# Patient Record
Sex: Female | Born: 1950 | Race: Black or African American | Hispanic: No | Marital: Single | Smoking: Never smoker
Health system: Southern US, Community
[De-identification: ages and names within clinical notes are randomized; demographics above are authoritative.]

## PROBLEM LIST (undated history)

## (undated) DIAGNOSIS — T7840XA Allergy, unspecified, initial encounter: Secondary | ICD-10-CM

## (undated) DIAGNOSIS — I499 Cardiac arrhythmia, unspecified: Secondary | ICD-10-CM

## (undated) DIAGNOSIS — I10 Essential (primary) hypertension: Secondary | ICD-10-CM

## (undated) DIAGNOSIS — D259 Leiomyoma of uterus, unspecified: Secondary | ICD-10-CM

## (undated) DIAGNOSIS — K219 Gastro-esophageal reflux disease without esophagitis: Secondary | ICD-10-CM

## (undated) DIAGNOSIS — M1990 Unspecified osteoarthritis, unspecified site: Secondary | ICD-10-CM

## (undated) HISTORY — PX: COLONOSCOPY: SHX174

## (undated) HISTORY — DX: Leiomyoma of uterus, unspecified: D25.9

## (undated) HISTORY — DX: Gastro-esophageal reflux disease without esophagitis: K21.9

## (undated) HISTORY — DX: Essential (primary) hypertension: I10

## (undated) HISTORY — PX: TUBAL LIGATION: SHX77

## (undated) HISTORY — PX: HEMORRHOID SURGERY: SHX153

## (undated) HISTORY — PX: TONSILLECTOMY: SUR1361

---

## 1998-04-08 ENCOUNTER — Encounter: Admission: RE | Admit: 1998-04-08 | Discharge: 1998-04-08 | Payer: Self-pay | Admitting: Obstetrics

## 1999-04-19 ENCOUNTER — Emergency Department (HOSPITAL_COMMUNITY): Admission: EM | Admit: 1999-04-19 | Discharge: 1999-04-19 | Payer: Self-pay

## 1999-04-28 ENCOUNTER — Encounter: Admission: RE | Admit: 1999-04-28 | Discharge: 1999-04-28 | Payer: Self-pay | Admitting: Internal Medicine

## 1999-05-04 ENCOUNTER — Encounter: Admission: RE | Admit: 1999-05-04 | Discharge: 1999-05-04 | Payer: Self-pay | Admitting: Internal Medicine

## 1999-05-08 ENCOUNTER — Ambulatory Visit (HOSPITAL_COMMUNITY): Admission: RE | Admit: 1999-05-08 | Discharge: 1999-05-08 | Payer: Self-pay | Admitting: *Deleted

## 2000-03-15 ENCOUNTER — Encounter: Admission: RE | Admit: 2000-03-15 | Discharge: 2000-03-15 | Payer: Self-pay

## 2000-09-18 ENCOUNTER — Encounter: Admission: RE | Admit: 2000-09-18 | Discharge: 2000-09-18 | Payer: Self-pay | Admitting: Internal Medicine

## 2001-03-26 ENCOUNTER — Encounter: Admission: RE | Admit: 2001-03-26 | Discharge: 2001-03-26 | Payer: Self-pay

## 2001-04-30 ENCOUNTER — Encounter: Admission: RE | Admit: 2001-04-30 | Discharge: 2001-04-30 | Payer: Self-pay | Admitting: *Deleted

## 2001-06-19 ENCOUNTER — Emergency Department (HOSPITAL_COMMUNITY): Admission: EM | Admit: 2001-06-19 | Discharge: 2001-06-19 | Payer: Self-pay | Admitting: Emergency Medicine

## 2001-06-19 ENCOUNTER — Encounter: Payer: Self-pay | Admitting: Emergency Medicine

## 2002-02-20 ENCOUNTER — Encounter: Admission: RE | Admit: 2002-02-20 | Discharge: 2002-02-20 | Payer: Self-pay | Admitting: Internal Medicine

## 2002-03-20 ENCOUNTER — Encounter: Admission: RE | Admit: 2002-03-20 | Discharge: 2002-03-20 | Payer: Self-pay | Admitting: Internal Medicine

## 2002-03-26 ENCOUNTER — Ambulatory Visit (HOSPITAL_COMMUNITY): Admission: RE | Admit: 2002-03-26 | Discharge: 2002-03-26 | Payer: Self-pay | Admitting: Internal Medicine

## 2002-07-22 ENCOUNTER — Encounter: Admission: RE | Admit: 2002-07-22 | Discharge: 2002-07-22 | Payer: Self-pay | Admitting: Internal Medicine

## 2002-12-11 ENCOUNTER — Encounter: Admission: RE | Admit: 2002-12-11 | Discharge: 2002-12-11 | Payer: Self-pay | Admitting: Internal Medicine

## 2003-05-20 ENCOUNTER — Ambulatory Visit (HOSPITAL_COMMUNITY): Admission: RE | Admit: 2003-05-20 | Discharge: 2003-05-20 | Payer: Self-pay | Admitting: Internal Medicine

## 2003-06-26 LAB — FECAL OCCULT BLOOD, GUAIAC: Fecal Occult Blood: NEGATIVE

## 2003-07-17 ENCOUNTER — Encounter: Admission: RE | Admit: 2003-07-17 | Discharge: 2003-07-17 | Payer: Self-pay | Admitting: Internal Medicine

## 2003-08-18 ENCOUNTER — Ambulatory Visit (HOSPITAL_COMMUNITY): Admission: RE | Admit: 2003-08-18 | Discharge: 2003-08-18 | Payer: Self-pay | Admitting: Internal Medicine

## 2003-08-18 ENCOUNTER — Encounter (INDEPENDENT_AMBULATORY_CARE_PROVIDER_SITE_OTHER): Payer: Self-pay | Admitting: Cardiology

## 2003-11-13 ENCOUNTER — Ambulatory Visit: Payer: Self-pay | Admitting: Internal Medicine

## 2003-11-19 ENCOUNTER — Ambulatory Visit: Payer: Self-pay | Admitting: Internal Medicine

## 2003-11-21 ENCOUNTER — Encounter (INDEPENDENT_AMBULATORY_CARE_PROVIDER_SITE_OTHER): Payer: Self-pay | Admitting: Internal Medicine

## 2003-11-26 ENCOUNTER — Ambulatory Visit: Payer: Self-pay | Admitting: Internal Medicine

## 2004-03-07 ENCOUNTER — Emergency Department (HOSPITAL_COMMUNITY): Admission: EM | Admit: 2004-03-07 | Discharge: 2004-03-07 | Payer: Self-pay | Admitting: Family Medicine

## 2004-03-27 ENCOUNTER — Ambulatory Visit (HOSPITAL_COMMUNITY): Admission: RE | Admit: 2004-03-27 | Discharge: 2004-03-27 | Payer: Self-pay | Admitting: Orthopedic Surgery

## 2004-03-31 ENCOUNTER — Inpatient Hospital Stay (HOSPITAL_COMMUNITY): Admission: AD | Admit: 2004-03-31 | Discharge: 2004-03-31 | Payer: Self-pay

## 2004-04-05 ENCOUNTER — Encounter (INDEPENDENT_AMBULATORY_CARE_PROVIDER_SITE_OTHER): Payer: Self-pay | Admitting: Specialist

## 2004-04-05 ENCOUNTER — Ambulatory Visit: Payer: Self-pay | Admitting: Obstetrics and Gynecology

## 2004-06-29 ENCOUNTER — Ambulatory Visit: Payer: Self-pay | Admitting: Internal Medicine

## 2004-06-29 ENCOUNTER — Ambulatory Visit (HOSPITAL_COMMUNITY): Admission: RE | Admit: 2004-06-29 | Discharge: 2004-06-29 | Payer: Self-pay | Admitting: Family Medicine

## 2005-03-08 ENCOUNTER — Ambulatory Visit: Payer: Self-pay | Admitting: Internal Medicine

## 2005-06-08 ENCOUNTER — Encounter (INDEPENDENT_AMBULATORY_CARE_PROVIDER_SITE_OTHER): Payer: Self-pay | Admitting: Internal Medicine

## 2005-06-08 ENCOUNTER — Ambulatory Visit: Payer: Self-pay | Admitting: Internal Medicine

## 2005-06-08 ENCOUNTER — Encounter (INDEPENDENT_AMBULATORY_CARE_PROVIDER_SITE_OTHER): Payer: Self-pay | Admitting: *Deleted

## 2005-06-14 ENCOUNTER — Ambulatory Visit: Payer: Self-pay | Admitting: Internal Medicine

## 2005-07-19 ENCOUNTER — Ambulatory Visit (HOSPITAL_COMMUNITY): Admission: RE | Admit: 2005-07-19 | Discharge: 2005-07-19 | Payer: Self-pay | Admitting: Internal Medicine

## 2005-07-19 ENCOUNTER — Ambulatory Visit: Payer: Self-pay | Admitting: Obstetrics and Gynecology

## 2005-07-19 ENCOUNTER — Encounter (INDEPENDENT_AMBULATORY_CARE_PROVIDER_SITE_OTHER): Payer: Self-pay | Admitting: Internal Medicine

## 2005-09-21 ENCOUNTER — Ambulatory Visit: Payer: Self-pay | Admitting: Hospitalist

## 2005-12-09 DIAGNOSIS — N9489 Other specified conditions associated with female genital organs and menstrual cycle: Secondary | ICD-10-CM | POA: Insufficient documentation

## 2005-12-09 DIAGNOSIS — I10 Essential (primary) hypertension: Secondary | ICD-10-CM | POA: Insufficient documentation

## 2005-12-09 DIAGNOSIS — L259 Unspecified contact dermatitis, unspecified cause: Secondary | ICD-10-CM

## 2005-12-09 DIAGNOSIS — K219 Gastro-esophageal reflux disease without esophagitis: Secondary | ICD-10-CM

## 2005-12-09 DIAGNOSIS — D509 Iron deficiency anemia, unspecified: Secondary | ICD-10-CM

## 2006-02-14 ENCOUNTER — Telehealth: Payer: Self-pay | Admitting: Internal Medicine

## 2006-04-20 ENCOUNTER — Telehealth (INDEPENDENT_AMBULATORY_CARE_PROVIDER_SITE_OTHER): Payer: Self-pay | Admitting: Internal Medicine

## 2006-04-26 ENCOUNTER — Ambulatory Visit: Payer: Self-pay | Admitting: Internal Medicine

## 2006-04-26 ENCOUNTER — Encounter (INDEPENDENT_AMBULATORY_CARE_PROVIDER_SITE_OTHER): Payer: Self-pay | Admitting: Internal Medicine

## 2006-04-26 ENCOUNTER — Encounter (INDEPENDENT_AMBULATORY_CARE_PROVIDER_SITE_OTHER): Payer: Self-pay | Admitting: Infectious Diseases

## 2006-04-26 DIAGNOSIS — N898 Other specified noninflammatory disorders of vagina: Secondary | ICD-10-CM | POA: Insufficient documentation

## 2006-04-26 LAB — CONVERTED CEMR LAB
BUN: 11 mg/dL (ref 6–23)
CO2: 28 meq/L (ref 19–32)
Calcium: 9.4 mg/dL (ref 8.4–10.5)
Chlamydia, DNA Probe: NEGATIVE
Glucose, Bld: 85 mg/dL (ref 70–99)
Nitrite: NEGATIVE
Potassium: 3.6 meq/L (ref 3.5–5.3)
Protein, ur: NEGATIVE mg/dL
Sodium: 141 meq/L (ref 135–145)
Urine Glucose: NEGATIVE mg/dL
pH: 5.5 (ref 5.0–8.0)

## 2006-04-27 ENCOUNTER — Telehealth: Payer: Self-pay | Admitting: *Deleted

## 2006-04-27 LAB — CONVERTED CEMR LAB: Candida species: NEGATIVE

## 2006-08-06 ENCOUNTER — Telehealth (INDEPENDENT_AMBULATORY_CARE_PROVIDER_SITE_OTHER): Payer: Self-pay | Admitting: *Deleted

## 2006-09-06 ENCOUNTER — Ambulatory Visit (HOSPITAL_COMMUNITY): Admission: RE | Admit: 2006-09-06 | Discharge: 2006-09-06 | Payer: Self-pay | Admitting: Internal Medicine

## 2006-10-30 ENCOUNTER — Telehealth (INDEPENDENT_AMBULATORY_CARE_PROVIDER_SITE_OTHER): Payer: Self-pay | Admitting: *Deleted

## 2007-02-14 ENCOUNTER — Encounter (INDEPENDENT_AMBULATORY_CARE_PROVIDER_SITE_OTHER): Payer: Self-pay | Admitting: *Deleted

## 2007-02-14 ENCOUNTER — Ambulatory Visit: Payer: Self-pay | Admitting: Internal Medicine

## 2007-02-14 LAB — CONVERTED CEMR LAB
ALT: 11 units/L (ref 0–35)
CO2: 26 meq/L (ref 19–32)
Chloride: 102 meq/L (ref 96–112)
Creatinine, Ser: 0.95 mg/dL (ref 0.40–1.20)
MCHC: 32.4 g/dL (ref 30.0–36.0)
Potassium: 3.6 meq/L (ref 3.5–5.3)
RBC: 4.35 M/uL (ref 3.87–5.11)
RDW: 13.6 % (ref 11.5–15.5)
Total Bilirubin: 0.3 mg/dL (ref 0.3–1.2)
Total Protein: 7.1 g/dL (ref 6.0–8.3)
WBC: 4.1 10*3/uL (ref 4.0–10.5)

## 2007-02-15 DIAGNOSIS — M79609 Pain in unspecified limb: Secondary | ICD-10-CM

## 2007-02-27 ENCOUNTER — Ambulatory Visit: Payer: Self-pay | Admitting: Internal Medicine

## 2007-02-27 ENCOUNTER — Encounter (INDEPENDENT_AMBULATORY_CARE_PROVIDER_SITE_OTHER): Payer: Self-pay | Admitting: *Deleted

## 2007-02-27 LAB — CONVERTED CEMR LAB
HDL: 57 mg/dL (ref >39)
Total CHOL/HDL Ratio: 2.8
Triglycerides: 76 mg/dL (ref <150)
VLDL: 15 mg/dL (ref 0–40)

## 2007-07-18 ENCOUNTER — Ambulatory Visit: Payer: Self-pay | Admitting: Obstetrics and Gynecology

## 2007-07-18 ENCOUNTER — Encounter (INDEPENDENT_AMBULATORY_CARE_PROVIDER_SITE_OTHER): Payer: Self-pay | Admitting: Gynecology

## 2007-07-24 ENCOUNTER — Ambulatory Visit (HOSPITAL_COMMUNITY): Admission: RE | Admit: 2007-07-24 | Discharge: 2007-07-24 | Payer: Self-pay | Admitting: Family Medicine

## 2007-08-01 ENCOUNTER — Ambulatory Visit: Payer: Self-pay | Admitting: Family Medicine

## 2007-09-18 ENCOUNTER — Ambulatory Visit: Payer: Self-pay | Admitting: Obstetrics & Gynecology

## 2007-09-18 ENCOUNTER — Other Ambulatory Visit: Admission: RE | Admit: 2007-09-18 | Discharge: 2007-09-18 | Payer: Self-pay | Admitting: Gynecology

## 2007-09-26 ENCOUNTER — Ambulatory Visit (HOSPITAL_COMMUNITY): Admission: RE | Admit: 2007-09-26 | Discharge: 2007-09-26 | Payer: Self-pay | Admitting: Obstetrics and Gynecology

## 2007-10-02 ENCOUNTER — Ambulatory Visit: Payer: Self-pay | Admitting: Obstetrics and Gynecology

## 2008-02-05 ENCOUNTER — Ambulatory Visit: Payer: Self-pay | Admitting: Internal Medicine

## 2008-02-05 ENCOUNTER — Encounter (INDEPENDENT_AMBULATORY_CARE_PROVIDER_SITE_OTHER): Payer: Self-pay | Admitting: Internal Medicine

## 2008-02-05 DIAGNOSIS — G5710 Meralgia paresthetica, unspecified lower limb: Secondary | ICD-10-CM

## 2008-02-05 LAB — CONVERTED CEMR LAB
CO2: 28 meq/L (ref 19–32)
Chloride: 100 meq/L (ref 96–112)
Creatinine, Ser: 1.06 mg/dL (ref 0.40–1.20)
Glucose, Bld: 80 mg/dL (ref 70–99)
Potassium: 3.6 meq/L (ref 3.5–5.3)
Sodium: 140 meq/L (ref 135–145)

## 2008-08-12 ENCOUNTER — Encounter (INDEPENDENT_AMBULATORY_CARE_PROVIDER_SITE_OTHER): Payer: Self-pay | Admitting: Internal Medicine

## 2008-08-12 ENCOUNTER — Ambulatory Visit: Payer: Self-pay | Admitting: Internal Medicine

## 2008-08-12 DIAGNOSIS — K0501 Acute gingivitis, non-plaque induced: Secondary | ICD-10-CM

## 2008-09-23 ENCOUNTER — Telehealth (INDEPENDENT_AMBULATORY_CARE_PROVIDER_SITE_OTHER): Payer: Self-pay | Admitting: Internal Medicine

## 2008-10-10 ENCOUNTER — Emergency Department (HOSPITAL_COMMUNITY): Admission: EM | Admit: 2008-10-10 | Discharge: 2008-10-10 | Payer: Self-pay | Admitting: Emergency Medicine

## 2008-10-26 ENCOUNTER — Ambulatory Visit: Payer: Self-pay | Admitting: Infectious Diseases

## 2008-10-26 DIAGNOSIS — M549 Dorsalgia, unspecified: Secondary | ICD-10-CM | POA: Insufficient documentation

## 2008-10-26 LAB — CONVERTED CEMR LAB
CO2: 29 meq/L (ref 19–32)
Calcium: 9.7 mg/dL (ref 8.4–10.5)

## 2008-11-04 ENCOUNTER — Ambulatory Visit (HOSPITAL_COMMUNITY): Admission: RE | Admit: 2008-11-04 | Discharge: 2008-11-04 | Payer: Self-pay | Admitting: Internal Medicine

## 2009-02-11 ENCOUNTER — Ambulatory Visit: Payer: Self-pay | Admitting: Obstetrics and Gynecology

## 2009-02-25 ENCOUNTER — Telehealth (INDEPENDENT_AMBULATORY_CARE_PROVIDER_SITE_OTHER): Payer: Self-pay | Admitting: Internal Medicine

## 2009-04-30 ENCOUNTER — Telehealth (INDEPENDENT_AMBULATORY_CARE_PROVIDER_SITE_OTHER): Payer: Self-pay | Admitting: Internal Medicine

## 2009-06-11 ENCOUNTER — Ambulatory Visit: Payer: Self-pay | Admitting: Internal Medicine

## 2009-07-09 ENCOUNTER — Telehealth: Payer: Self-pay | Admitting: *Deleted

## 2009-07-13 ENCOUNTER — Emergency Department (HOSPITAL_COMMUNITY)
Admission: EM | Admit: 2009-07-13 | Discharge: 2009-07-13 | Payer: Self-pay | Source: Home / Self Care | Admitting: Emergency Medicine

## 2009-09-27 ENCOUNTER — Ambulatory Visit: Payer: Self-pay | Admitting: Internal Medicine

## 2009-09-28 ENCOUNTER — Encounter: Payer: Self-pay | Admitting: Internal Medicine

## 2009-09-28 LAB — CONVERTED CEMR LAB
Albumin: 3.8 g/dL (ref 3.5–5.2)
Alkaline Phosphatase: 67 units/L (ref 39–117)
Calcium: 9.3 mg/dL (ref 8.4–10.5)
Chloride: 104 meq/L (ref 96–112)
Cholesterol: 141 mg/dL (ref 0–200)
Creatinine, Ser: 0.98 mg/dL (ref 0.40–1.20)
Glucose, Bld: 87 mg/dL (ref 70–99)
HDL: 56 mg/dL (ref >39)
LDL Cholesterol: 72 mg/dL (ref 0–99)
Potassium: 3.8 meq/L (ref 3.5–5.3)
Sodium: 140 meq/L (ref 135–145)
TSH: 1.106 microintl units/mL (ref 0.350–4.5)
Total Bilirubin: 0.4 mg/dL (ref 0.3–1.2)
Total Protein: 6.5 g/dL (ref 6.0–8.3)
Triglycerides: 66 mg/dL (ref <150)
VLDL: 13 mg/dL (ref 0–40)

## 2009-12-13 ENCOUNTER — Telehealth: Payer: Self-pay | Admitting: Internal Medicine

## 2009-12-30 ENCOUNTER — Ambulatory Visit: Payer: Self-pay | Admitting: Internal Medicine

## 2009-12-30 DIAGNOSIS — M5430 Sciatica, unspecified side: Secondary | ICD-10-CM | POA: Insufficient documentation

## 2010-02-14 ENCOUNTER — Encounter: Payer: Self-pay | Admitting: Obstetrics & Gynecology

## 2010-02-14 ENCOUNTER — Ambulatory Visit
Admission: RE | Admit: 2010-02-14 | Discharge: 2010-02-14 | Payer: Self-pay | Source: Home / Self Care | Attending: Obstetrics and Gynecology | Admitting: Obstetrics and Gynecology

## 2010-02-14 LAB — CONVERTED CEMR LAB
Chlamydia, DNA Probe: NEGATIVE
GC Probe Amp, Genital: NEGATIVE

## 2010-02-15 ENCOUNTER — Encounter: Payer: Self-pay | Admitting: Obstetrics & Gynecology

## 2010-02-15 LAB — CONVERTED CEMR LAB

## 2010-02-17 ENCOUNTER — Other Ambulatory Visit (HOSPITAL_COMMUNITY): Payer: Self-pay | Admitting: *Deleted

## 2010-02-17 DIAGNOSIS — D219 Benign neoplasm of connective and other soft tissue, unspecified: Secondary | ICD-10-CM

## 2010-02-19 ENCOUNTER — Encounter: Payer: Self-pay | Admitting: Internal Medicine

## 2010-02-21 ENCOUNTER — Encounter: Payer: Self-pay | Admitting: *Deleted

## 2010-02-21 ENCOUNTER — Encounter: Payer: Self-pay | Admitting: Internal Medicine

## 2010-02-22 ENCOUNTER — Ambulatory Visit (HOSPITAL_COMMUNITY)
Admission: RE | Admit: 2010-02-22 | Discharge: 2010-02-22 | Payer: Self-pay | Source: Home / Self Care | Attending: Gastroenterology | Admitting: Gastroenterology

## 2010-03-01 NOTE — Assessment & Plan Note (Signed)
Summary: checkup, med refills, labs/pcp-Briana Ortiz/hla   Vital Signs:  Patient profile:   59 year old female Height:      61 inches (154.94 cm) Weight:      196.8 pounds (89.45 kg) BMI:     37.32 Temp:     96.8 degrees F (36 degrees C) Pulse rate:   62 / minute BP sitting:   135 / 96  (right arm) Cuff size:   large  Vitals Entered By: Dorie Rank RN (December 30, 2009 2:51 PM) CC: need to meet dr and wants refills - wants pain relief for "sciatica they say I have....across left hip and down my leg...standing on cement all day" Is Patient Diabetic? No Pain Assessment Patient in pain? yes     Location: left hip and leg Intensity: 6 Type: aching Onset of pain  worse when standing up - chronic Nutritional Status BMI of > 30 = obese  Have you ever been in a relationship where you felt threatened, hurt or afraid?No   Does patient need assistance? Functional Status Self care Ambulation Normal   Primary Care Provider:  Jason Coop MD  CC:  need to meet dr and wants refills - wants pain relief for "sciatica they say I have....across left hip and down my leg...standing on cement all day".  History of Present Illness: 60 yo woman with past medical history of chronic left lower back pain that radiates down to her leg.  She has been taking neurontin in the past several years but feel like the pain is getting worse and that Neutontin is not working.  Otherwise, no other complaints.  She is exercising and eating a lot of vegetables.       Preventive Screening-Counseling & Management  Alcohol-Tobacco     Alcohol type: beer on w/e     Smoking Status: never  Caffeine-Diet-Exercise     Does Patient Exercise: no  Allergies: No Known Drug Allergies  Review of Systems  The patient denies anorexia, fever, weight loss, weight gain, vision loss, decreased hearing, hoarseness, chest pain, syncope, dyspnea on exertion, peripheral edema, prolonged cough, headaches, hemoptysis, abdominal  pain, melena, hematochezia, severe indigestion/heartburn, hematuria, incontinence, genital sores, muscle weakness, suspicious skin lesions, transient blindness, difficulty walking, depression, unusual weight change, abnormal bleeding, enlarged lymph nodes, angioedema, breast masses, and testicular masses.    Physical Exam  General:  alert, well-developed, well-nourished, and well-hydrated.   Neck:  supple, no thyromegaly Lungs:  normal respiratory effort, no intercostal retractions, no accessory muscle use, and normal breath sounds.   Heart:  normal rate, regular rhythm, no murmur, and no gallop.   Abdomen:  soft, non-tender, normal bowel sounds, no distention, and no masses.   Msk:  limited flexion of left lower extremity secondary to sciatica pain,pain radiates from lower back to lower leg.  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  Patient reports that Neurontin 300mg  by mouth at bedtime is not working as before.  We will increase Neurontin to 600mg  by mouth at bedtime to see if she will get more relief.  She will come back in 4 weeks to follow up with me.    Problem # 2:  HYPERTENSION (ICD-401.9) Assessment: Unchanged At goal.  Will continue current medications.  Her updated medication list for this  problem includes:    Hydrochlorothiazide 25 Mg Tabs (Hydrochlorothiazide) .Marland Kitchen... Take 1 tablet by mouth once a day for blood pressure.    Benazepril Hcl 10 Mg Tabs (Benazepril hcl) .Marland Kitchen... Take 1 tablet by mouth once a day for blood pressure.  Problem # 3:  PREVENTIVE HEALTH CARE (ICD-V70.0) Flu shot given She had a mammogram appointment last week; however, could not make it and will reschedule Will schedule colonoscopy for screening.  Complete  Medication List: 1)  Hydrochlorothiazide 25 Mg Tabs (Hydrochlorothiazide) .... Take 1 tablet by mouth once a day for blood pressure. 2)  Benazepril Hcl 10 Mg Tabs (Benazepril hcl) .... Take 1 tablet by mouth once a day for blood pressure. 3)  Prilosec 20 Mg Cpdr (Omeprazole) .... Take 1pill by mouth daily as needed for acid reflux 4)  Neurontin 600 Mg Tabs (Gabapentin) .... Take one tablet at bed time 5)  Chlorhexidine Gluconate 0.12 % Soln (Chlorhexidine gluconate) .... Swish for 30 seconds with 15 ml chlorhexidine, then expectorate; repeat twice daily (morning and evening).  Other Orders: Influenza Vaccine NON MCR ([address]109) Radiology other (Radiology Other) Gastroenterology Referral (GI)  Patient Instructions: 1)  Please increase your Neurontin to 600mg  by mouth at bedtime and we can reevaluate your pain in 4 weeks. 2)  Continue diet and exercise- you are doing a great job! 3)  Need to schedule for Colonoscopy 4)  Need to schedule for Mammogram Prescriptions: NEURONTIN 600 MG TABS (GABAPENTIN) take one tablet at bed time  #30 x 6   Entered and Authorized by:   Rosana Berger MD   Signed by:   Rosana Berger MD on 12/30/2009   Method used:   Print then Give to Patient   RxID:   [id number]    Orders Added: 1)  Influenza Vaccine NON MCR [00028] 2)  Radiology other [Radiology Other] 3)  Gastroenterology Referral [GI] 4)  Est. Patient Level III [95621]   Immunizations Administered:  Influenza Vaccine # 1:    Vaccine Type: Fluvax Non-MCR    Site: right deltoid    Mfr: GlaxoSmithKline    Dose: 0.5 ml    Route: IM    Given by: Dorie Rank RN    Exp. Date: 07/30/2010    Lot #: [id number]    VIS given: 08/24/09 version given December 30, 2009.  Flu Vaccine Consent Questions:    Do you have a history of severe allergic reactions to this vaccine? no    Any prior history of allergic reactions to egg and/or gelatin? no    Do you have a sensitivity to the preservative Thimersol?  no    Do you have a past history of Guillan-Barre Syndrome? no    Do you currently have an acute febrile illness? no    Have you ever had a severe reaction to latex? no    Vaccine information given and explained to patient? yes    Are you currently pregnant? no   Immunizations Administered:  Influenza Vaccine # 1:    Vaccine Type: Fluvax Non-MCR    Site: right deltoid    Mfr: GlaxoSmithKline    Dose: 0.5 ml    Route: IM    Given by: Dorie Rank RN    Exp. Date: 07/30/2010    Lot #: [id number]    VIS given: 08/24/09 version given December 30, 2009.   Prevention & Chronic Care Immunizations   Influenza vaccine: Fluvax Non-MCR  (12/30/2009)    Tetanus booster: 07/22/2002: Td  Pneumococcal vaccine: Not documented  Colorectal Screening   Hemoccult: Negative  (06/26/2003)   Hemoccult action/deferral: Ordered  (06/11/2009)    Colonoscopy: Not documented   Colonoscopy action/deferral: GI referral  (06/11/2009)  Other Screening   Pap smear: Not documented    Mammogram: ASSESSMENT: Negative - BI-RADS 1^MM DIGITAL SCREENING  (11/04/2008)   Mammogram action/deferral: Ordered  (08/12/2008)   Smoking status: never  (12/30/2009)  Lipids   Total Cholesterol: 141  (09/27/2009)   LDL: 72  (09/27/2009)   LDL Direct: Not documented   HDL: 56  (09/27/2009)   Triglycerides: 66  (09/27/2009)  Hypertension   Last Blood Pressure: 135 / 96  (12/30/2009)   Serum creatinine: 0.98  (09/27/2009)   BMP action: Ordered   Serum potassium 3.8  (09/27/2009)  Self-Management Support :   Personal Goals (by the next clinic visit) :      Personal blood pressure goal: 140/90  (10/26/2008)   Patient will work on the following items until the next clinic visit to reach self-care goals:     Medications and monitoring: take my medicines every day, bring all of my medications to every visit  (12/30/2009)     Eating: drink diet soda or water instead of juice or soda, eat foods that are low in  salt, limit or avoid alcohol  (12/30/2009)     Activity: join a walking program  (12/30/2009)    Hypertension self-management support: Pre-printed educational material, Written self-care plan, Resources for patients handout  (12/30/2009)   Hypertension self-care plan printed.      Resource handout printed.   Nursing Instructions: Give Flu vaccine today     Appended Document: checkup, med refills, labs/pcp-Briana Ortiz/hla I discussed the patient with Dr. Anselm Jungling and I agree with the assessment and plan as outlined above.

## 2010-03-01 NOTE — Progress Notes (Signed)
Summary: refill/ hla  Phone Note Refill Request Message from:  Patient on December 13, 2009 8:52 AM  Refills Requested: Medication #1:  HYDROCHLOROTHIAZIDE 25 MG TABS Take 1 tablet by mouth once a day for blood pressure.   Dosage confirmed as above?Dosage Confirmed  Medication #2:  BENAZEPRIL HCL 10 MG TABS Take 1 tablet by mouth once a day for blood pressure.   Dosage confirmed as above?Dosage Confirmed  Medication #3:  PRILOSEC 20 MG CPDR take 1pill by mouth daily as needed for acid reflux   Dosage confirmed as above?Dosage Confirmed  Medication #4:  NEURONTIN 300 MG CAPS take 1 capsule at bedtime.   Dosage confirmed as above?Dosage Confirmed last visit 05/2009, overdue for appt, appt set for 12/1 w/ dr Anselm Jungling  Initial call taken by: Marin Roberts RN,  December 13, 2009 8:52 AM    Prescriptions: NEURONTIN 300 MG CAPS (GABAPENTIN) take 1 capsule at bedtime.  #30 x 3   Entered and Authorized by:   Ulyess Mort MD   Signed by:   Ulyess Mort MD on 12/13/2009   Method used:   Electronically to        [facility] (retail)       [address]       [place], [place]  [address]       Ph: [phone]       Fax: [phone]   RxID:   [id number] PRILOSEC 20 MG CPDR (OMEPRAZOLE) take 1pill by mouth daily as needed for acid reflux  #30 x 3   Entered and Authorized by:   Ulyess Mort MD   Signed by:   Ulyess Mort MD on 12/13/2009   Method used:   Electronically to        [facility] (retail)       [address]       [place], [place]  [address]       Ph: [phone]       Fax: [phone]   RxID:   [id number] BENAZEPRIL HCL 10 MG TABS (BENAZEPRIL HCL) Take 1 tablet by mouth once a day for blood pressure.  #30 x 3   Entered and Authorized by:   Ulyess Mort MD   Signed by:   Ulyess Mort MD on 12/13/2009   Method used:   Electronically to        [facility] (retail)       [address]       [place], [place]  [address]       Ph: [phone]       Fax: [phone]   RxID:   [id number] HYDROCHLOROTHIAZIDE 25 MG TABS (HYDROCHLOROTHIAZIDE) Take 1 tablet by mouth once a day for blood pressure.  #30 x 3   Entered and Authorized by:   Ulyess Mort MD   Signed by:   Ulyess Mort MD on 12/13/2009   Method used:   Electronically to        [facility] (retail)       [address]       [place], [place]  [address]       Ph: [phone]       Fax: [phone]   RxID:   [id number]

## 2010-03-01 NOTE — Letter (Signed)
Summary: Generic Letter  [facility]  [address]   [place], [place] [address]   Phone: [phone]  Fax: [phone]    09/28/2009  Briana Ortiz [address] [place], [place]  [address]  Dear Briana Ortiz,    I have reviewed you blood work and every thing looks great. I have enclosed a copy of the results for you. You were last seen in May I did not find any future appoitments. I have asked Ms Lissa Hoard to schedule an appoitment before the end of the year so that you can meet Dr Anselm Jungling, your new doctor.     Sincerely,     Blanch Media MD

## 2010-03-01 NOTE — Progress Notes (Signed)
Summary: RTC  Phone Note Call from Patient   Caller: Patient Call For: Jason Coop MD Summary of Call: Call frompt to ask if her prescription for HCTZ could be transferred to the [facility]. Call to [facility] prescription for HCTZ has been on hold there  since 06/11/2009.  Attempts to call pt to inform her of this line is busy.Angelina Ok RN  July 09, 2009 11:00 AM  Initial call taken by: Angelina Ok RN,  July 09, 2009 11:00 AM

## 2010-03-01 NOTE — Assessment & Plan Note (Signed)
Summary: EST-CK/FU/MEDS/CFB   Vital Signs:  Patient profile:   60 year old female Height:      61 inches Weight:      199.6 pounds BMI:     37.85 Temp:     97.3 degrees F oral Pulse rate:   64 / minute BP sitting:   125 / 87  (right arm)  Vitals Entered By: Filomena Jungling NT II (Jun 11, 2009 2:01 PM) CC: left leg and hip and thigh Is Patient Diabetic? No Pain Assessment Patient in pain? yes     Location: left leg Intensity: 10 Type: aching Nutritional Status BMI of > 30 = obese  Have you ever been in a relationship where you felt threatened, hurt or afraid?No   Does patient need assistance? Functional Status Self care Ambulation Normal   Primary Care Provider:  Jason Coop MD  CC:  left leg and hip and thigh.  History of Present Illness: Briana Ortiz is a 60 yo lady with PMH as outlined in the EMR comes today for f/u visit.   1. HTN; She is taking her meds without any problem.   2. Meralgia Paresthetica: She is taking neurontin only intermittently whenever she is having worsening pain and it helps somewhat when she takes.   3. She is getting her pap smear and mammogram at [facility] and everything was normal on 11/10.   Preventive Screening-Counseling & Management  Alcohol-Tobacco     Alcohol type: beer on w/e     Smoking Status: never  Caffeine-Diet-Exercise     Does Patient Exercise: no  Current Medications (verified): 1)  Hydrochlorothiazide 25 Mg Tabs (Hydrochlorothiazide) .... Take 1 Tablet By Mouth Once A Day For Blood Pressure. 2)  Benazepril Hcl 10 Mg Tabs (Benazepril Hcl) .... Take 1 Tablet By Mouth Once A Day For Blood Pressure. 3)  Prilosec 20 Mg Cpdr (Omeprazole) .... Take 1pill By Mouth Daily As Needed For Acid Reflux 4)  Neurontin 300 Mg Caps (Gabapentin) .... Take 1 Capsule At Bedtime. 5)  Chlorhexidine Gluconate 0.12 % Soln (Chlorhexidine Gluconate) .... Swish For 30 Seconds With 15 Ml Chlorhexidine, Then Expectorate; Repeat Twice Daily  (Morning and Evening).  Allergies: No Known Drug Allergies  Review of Systems      See HPI  Physical Exam  Mouth:  pharynx pink and moist.   Lungs:  normal breath sounds, no crackles, and no wheezes.   Heart:  normal rate, regular rhythm, no murmur, no gallop, and no rub.   Abdomen:  soft, non-tender, normal bowel sounds, and no distention.   Extremities:  trace left pedal edema and trace right pedal edema.   Neurologic:  alert & oriented X3.     Impression & Recommendations:  Problem # 1:  SPECIAL SCREENING FOR MALIGNANT NEOPLASMS COLON (ICD-V76.51) Pt agreed to get a screening colonoscopy this time and so will refer to GI.   Orders: Gastroenterology Referral (GI)  Problem # 2:  PREVENTIVE HEALTH CARE (ICD-V70.0) Pap smear and mammogram done at [facility] on 11/10 and wnl. Check FLP.   Problem # 3:  MERALGIA PARESTHETICA (ICD-355.1) She has not taken neurontin regularly and whenever she takes she feels like there was some relief. Plan is to take neurontin regularly and f/u in 4-6 wks. If pain is not improved, will increase the dose to 300mg  three times a day.   Problem # 4:  HYPERTENSION (ICD-401.9) BP great. Plan is to continue same and check followings.  Her updated medication list for this  problem includes:    Hydrochlorothiazide 25 Mg Tabs (Hydrochlorothiazide) .Marland Kitchen... Take 1 tablet by mouth once a day for blood pressure.    Benazepril Hcl 10 Mg Tabs (Benazepril hcl) .Marland Kitchen... Take 1 tablet by mouth once a day for blood pressure.  Orders: T-Comprehensive Metabolic Panel [id number]) T-Lipid Profile ([id number]) T-TSH ([id number])  BP today: 125/87 Prior BP: 130/80 (10/26/2008)  Labs Reviewed: K+: 3.9 (10/26/2008) Creat: : 1.00 (10/26/2008)   Chol: 160 (02/27/2007)   HDL: 57 (02/27/2007)   LDL: 88 (02/27/2007)   TG: 76 (02/27/2007)  Complete Medication List: 1)  Hydrochlorothiazide 25 Mg Tabs (Hydrochlorothiazide) .... Take 1 tablet by mouth once a day for  blood pressure. 2)  Benazepril Hcl 10 Mg Tabs (Benazepril hcl) .... Take 1 tablet by mouth once a day for blood pressure. 3)  Prilosec 20 Mg Cpdr (Omeprazole) .... Take 1pill by mouth daily as needed for acid reflux 4)  Neurontin 300 Mg Caps (Gabapentin) .... Take 1 capsule at bedtime. 5)  Chlorhexidine Gluconate 0.12 % Soln (Chlorhexidine gluconate) .... Swish for 30 seconds with 15 ml chlorhexidine, then expectorate; repeat twice daily (morning and evening).  Patient Instructions: 1)  F/u in 6 weeks.  2)  Limit your Sodium (Salt) to less than 2 grams a day(slightly less than 1/2 a teaspoon) to prevent fluid retention, swelling, or worsening of symptoms. 3)  It is important that you exercise regularly at least 20 minutes 5 times a week. If you develop chest pain, have severe difficulty breathing, or feel very tired , stop exercising immediately and seek medical attention. 4)  You need to lose weight. Consider a lower calorie diet and regular exercise.  5)  Check your Blood Pressure regularly. If it is above: you should make an appointment. Prescriptions: BENAZEPRIL HCL 10 MG TABS (BENAZEPRIL HCL) Take 1 tablet by mouth once a day for blood pressure.  #30 x 3   Entered and Authorized by:   Jason Coop MD   Signed by:   Jason Coop MD on 06/13/2009   Method used:   Electronically to        [facility] (retail)       [address]       [place], [place]  [address]       Ph: [phone]       Fax: [phone]   RxID:   [id number] HYDROCHLOROTHIAZIDE 25 MG TABS (HYDROCHLOROTHIAZIDE) Take 1 tablet by mouth once a day for blood pressure.  #30 x 3   Entered and Authorized by:   Jason Coop MD   Signed by:   Jason Coop MD on 06/13/2009   Method used:   Electronically to        [facility] (retail)       [address]       [place], [place]  [address]       Ph: [phone]       Fax: [phone]    RxID:   [id number]   Process Orders Check Orders Results:     [facility]: ABN not required for this insurance Tests Sent for requisitioning (Jun 13, 2009 9:40 AM):     06/11/2009: [facility] -- T-Comprehensive Metabolic Panel [[id number]] (signed)     06/11/2009: [facility] -- T-Lipid Profile [id number] (signed)     06/11/2009: [facility] -- T-TSH [phone] (signed)  Prevention & Chronic Care Immunizations   Influenza vaccine: Not documented    Tetanus booster: 07/22/2002: Td    Pneumococcal vaccine: Not documented  Colorectal Screening   Hemoccult: Negative  (06/26/2003)   Hemoccult action/deferral: Ordered  (06/11/2009)    Colonoscopy: Not documented   Colonoscopy action/deferral: GI referral  (06/11/2009)  Other Screening   Pap smear: Not documented    Mammogram: ASSESSMENT: Negative - BI-RADS 1^MM DIGITAL SCREENING  (11/04/2008)   Mammogram action/deferral: Ordered  (08/12/2008)   Smoking status: never  (06/11/2009)  Lipids   Total Cholesterol: 160  (02/27/2007)   LDL: 88  (02/27/2007)   LDL Direct: Not documented   HDL: 57  (02/27/2007)   Triglycerides: 76  (02/27/2007)  Hypertension   Last Blood Pressure: 125 / 87  (06/11/2009)   Serum creatinine: 1.00  (10/26/2008)   BMP action: Ordered   Serum potassium 3.9  (10/26/2008) CMP ordered     Hypertension flowsheet reviewed?: Yes   Progress toward BP goal: At goal  Self-Management Support :   Personal Goals (by the next clinic visit) :      Personal blood pressure goal: 140/90  (10/26/2008)   Patient will work on the following items until the next clinic visit to reach self-care goals:     Medications and monitoring: take my medicines every day  (06/11/2009)     Eating: drink diet soda or water instead of juice or soda, use fresh or frozen vegetables, eat baked foods instead of fried foods, eat fruit for snacks and  desserts  (06/11/2009)     Activity: take a 30 minute walk every day  (10/26/2008)    Hypertension self-management support: Education handout  (06/11/2009)   Hypertension education handout printed   Nursing Instructions: Provide Hemoccult cards with instructions (see order)   Appended Document: Orders Update    Clinical Lists Changes  Orders: Added new Test order of T-CMP with Estimated GFR ([id number]) - Signed Added new Test order of T-Lipid Profile [phone]) - Signed Added new Test order of T-TSH [phone]) - Signed      Process Orders Check Orders Results:     [facility]: ABN not required for this insurance Order queued for requisitioning for [facility]: September 27, 2009 9:17 AM  Tests Sent for requisitioning (September 27, 2009 12:37 PM):     09/27/2009: [facility] -- T-CMP with Estimated GFR [[id number]] (signed)     09/27/2009: [facility] -- T-Lipid Profile [phone] (signed)     09/27/2009: [facility] -- T-TSH [phone] (signed)

## 2010-03-01 NOTE — Progress Notes (Signed)
Summary: refill/ hla  Phone Note Refill Request Message from:  Patient on February 25, 2009 11:51 AM  Refills Requested: Medication #1:  HYDROCHLOROTHIAZIDE 25 MG TABS Take 1 tablet by mouth once a day for blood pressure.  Medication #2:  BENAZEPRIL HCL 10 MG TABS Take 1 tablet by mouth once a day for blood pressure. Initial call taken by: Marin Roberts RN,  February 25, 2009 11:51 AM  Follow-up for Phone Call       Follow-up by: Jason Coop MD,  February 25, 2009 7:32 PM    Prescriptions: BENAZEPRIL HCL 10 MG TABS (BENAZEPRIL HCL) Take 1 tablet by mouth once a day for blood pressure.  #30 x 3   Entered and Authorized by:   Jason Coop MD   Signed by:   Jason Coop MD on 02/25/2009   Method used:   Electronically to        [facility] (retail)       [address]       [place], [place]  [address]       Ph: [phone]       Fax: [phone]   RxID:   [id number] HYDROCHLOROTHIAZIDE 25 MG TABS (HYDROCHLOROTHIAZIDE) Take 1 tablet by mouth once a day for blood pressure.  #30 x 3   Entered and Authorized by:   Jason Coop MD   Signed by:   Jason Coop MD on 02/25/2009   Method used:   Electronically to        [facility] (retail)       [address]       [place], [place]  [address]       Ph: [phone]       Fax: [phone]   RxID:   [id number]   Appended Document: refill/ hla Above prescriptionsfaxed to the [facility] Department per pt request and cancelled at the [facility].   Angelina Ok RN February 26, 2009 10:23 AM

## 2010-03-01 NOTE — Progress Notes (Signed)
Summary: Refill/gh  Phone Note Refill Request Message from:  Fax from Pharmacy on April 30, 2009 10:33 AM  Refills Requested: Medication #1:  NEURONTIN 300 MG CAPS take 1 capsule at bedtime.   Last Refilled: 10/16/2008 Last vist and labs 10/26/2008.   Method Requested: Electronic Initial call taken by: Angelina Ok RN,  April 30, 2009 10:34 AM  Follow-up for Phone Call        Pt needs an office visit for f/u and labwork.  Follow-up by: Jason Coop MD,  April 30, 2009 1:25 PM    Prescriptions: NEURONTIN 300 MG CAPS (GABAPENTIN) take 1 capsule at bedtime.  #30 x 3   Entered and Authorized by:   Jason Coop MD   Signed by:   Jason Coop MD on 04/30/2009   Method used:   Faxed to ...       [facility] (retail)       [address] [place], [place]  [address]       Ph: [phone]       Fax: [phone]   RxID:   [id number]   Appended Document: Refill/gh Flag sent to [facility] for an appt.

## 2010-03-02 ENCOUNTER — Ambulatory Visit: Payer: Self-pay | Admitting: Obstetrics and Gynecology

## 2010-03-05 ENCOUNTER — Other Ambulatory Visit: Payer: Self-pay | Admitting: Family Medicine

## 2010-03-05 DIAGNOSIS — D219 Benign neoplasm of connective and other soft tissue, unspecified: Secondary | ICD-10-CM

## 2010-03-07 ENCOUNTER — Other Ambulatory Visit (HOSPITAL_COMMUNITY): Payer: Self-pay

## 2010-03-07 ENCOUNTER — Ambulatory Visit (HOSPITAL_COMMUNITY): Admission: RE | Admit: 2010-03-07 | Payer: Self-pay | Source: Home / Self Care | Admitting: Family Medicine

## 2010-03-07 ENCOUNTER — Ambulatory Visit (HOSPITAL_COMMUNITY)
Admission: RE | Admit: 2010-03-07 | Payer: Self-pay | Source: Ambulatory Visit | Attending: *Deleted | Admitting: *Deleted

## 2010-03-15 NOTE — Op Note (Signed)
  Briana Ortiz, MATARAZZO NO.:  [id number]  MEDICAL RECORD NO.:  [id number]          PATIENT TYPE:  AMB  LOCATION:  ENDO                         FACILITY:  [facility]  PHYSICIAN:  Graylin Shiver, M.D.   DATE OF BIRTH:  1951/01/10  DATE OF PROCEDURE:  02/22/2010 DATE OF DISCHARGE:                              OPERATIVE REPORT   PROCEDURE:  Colonoscopy.  INDICATIONS:  Screening.  INFORMED CONSENT:  Informed consent was obtained after explanation of the risks of bleeding, infection and perforation.  PREMEDICATIONS: 1. Fentanyl 100 mcg IV. 2. Versed 9 mg IV.  DESCRIPTION OF PROCEDURE:  With the patient in the left lateral decubitus position, a rectal exam was performed and no masses were felt. The Pentax colonoscope was inserted into the rectum and advanced into the sigmoid colon.  The left colon and colon in its entirety seem to be very, very tortuous.  I had to apply pressure to various points on the abdomen and also laid the patient on her back.  I also had to flip her back over on her left side.  Despite all of this, I could not reach the end of the cecum.  I was able to get the scope into the ascending colon region but not all the way to the end.  The colon was very tortuous. The scope was brought out visualizing the mucosa.  No abnormalities were seen.  She tolerated the procedure well without obvious complications.  IMPRESSION:  Incomplete colonoscopy to the ascending colon.  The colon was very tortuous.  PLAN:  Obtain barium enema for inspection of the rest of the colon.          ______________________________ Graylin Shiver, M.D.     SFG/MEDQ  D:  02/22/2010  T:  02/22/2010  Job:  [id number]  cc:   Melvern Banker Fax: [phone]  Electronically Signed by Herbert Moors MD on 03/15/2010 12:48:08 PM

## 2010-03-16 ENCOUNTER — Ambulatory Visit: Payer: Self-pay | Admitting: Internal Medicine

## 2010-03-18 ENCOUNTER — Encounter: Payer: Self-pay | Admitting: Internal Medicine

## 2010-03-23 ENCOUNTER — Ambulatory Visit (HOSPITAL_COMMUNITY)
Admission: RE | Admit: 2010-03-23 | Discharge: 2010-03-23 | Disposition: A | Discharge status: Home / Self Care | Payer: Self-pay | Source: Ambulatory Visit | Attending: Family Medicine | Admitting: Family Medicine

## 2010-03-23 DIAGNOSIS — Z780 Asymptomatic menopausal state: Secondary | ICD-10-CM | POA: Insufficient documentation

## 2010-03-23 DIAGNOSIS — D251 Intramural leiomyoma of uterus: Secondary | ICD-10-CM | POA: Insufficient documentation

## 2010-03-23 DIAGNOSIS — D219 Benign neoplasm of connective and other soft tissue, unspecified: Secondary | ICD-10-CM

## 2010-04-14 ENCOUNTER — Ambulatory Visit: Payer: Self-pay | Admitting: Obstetrics and Gynecology

## 2010-04-27 ENCOUNTER — Ambulatory Visit: Payer: Self-pay | Admitting: Obstetrics and Gynecology

## 2010-04-27 ENCOUNTER — Other Ambulatory Visit: Payer: Self-pay | Admitting: Internal Medicine

## 2010-04-27 DIAGNOSIS — Z1231 Encounter for screening mammogram for malignant neoplasm of breast: Secondary | ICD-10-CM

## 2010-05-11 ENCOUNTER — Ambulatory Visit: Payer: Self-pay | Admitting: Obstetrics & Gynecology

## 2010-05-11 ENCOUNTER — Ambulatory Visit (HOSPITAL_COMMUNITY): Payer: Self-pay

## 2010-05-16 ENCOUNTER — Other Ambulatory Visit: Payer: Self-pay | Admitting: *Deleted

## 2010-05-16 MED ORDER — HYDROCHLOROTHIAZIDE 25 MG PO TABS: 25.0000 mg | ORAL_TABLET | Freq: Every day | ORAL | Status: DC

## 2010-05-16 MED ORDER — BENAZEPRIL HCL 10 MG PO TABS: 10.0000 mg | ORAL_TABLET | Freq: Every day | ORAL | Status: DC

## 2010-05-17 ENCOUNTER — Ambulatory Visit (HOSPITAL_COMMUNITY)
Admission: RE | Admit: 2010-05-17 | Discharge: 2010-05-17 | Disposition: A | Discharge status: Home / Self Care | Payer: Self-pay | Source: Ambulatory Visit | Attending: Internal Medicine | Admitting: Internal Medicine

## 2010-05-17 DIAGNOSIS — Z1231 Encounter for screening mammogram for malignant neoplasm of breast: Secondary | ICD-10-CM | POA: Insufficient documentation

## 2010-05-25 ENCOUNTER — Ambulatory Visit (HOSPITAL_COMMUNITY): Payer: Self-pay

## 2010-06-14 NOTE — Group Therapy Note (Signed)
NAME:  Briana Ortiz, Briana Ortiz NO.:  [id number]   MEDICAL RECORD NO.:  [id number]          PATIENT TYPE:  WOC   LOCATION:  [facility]                   FACILITY:  [facility]   PHYSICIAN:  Argentina Donovan, MD        DATE OF BIRTH:  [birth year]   DATE OF SERVICE:  07/18/2007                                  CLINIC NOTE   The patient is a 60 year old African American female who has not been in  to see [place] in 2 years.  After her last visit, she was having regular  periods and she had an enlarged uterus approximately 16 to 17 cm.  She  was referred in by her internist because she had not been in in 2 years  and had been treated just recently for hypertension.  She states she has  been having completely regular periods every month, although they have  been getting slightly heavier.  She denies menopausal symptoms and her  mother died at a very young age and she has no sisters.   EXAMINATION:  Her abdomen is soft, flat, nontender.  Uterus palpable  about midway between the umbilicus and the symphysis which is markedly  smaller than it was on her previous examination 2 years ago.  External  genitalia were normal.  BUS within normal limits.  Vagina is clean and  well rugated.  The cervix is clean but atrophic in appearance.  Attempted endometrial biopsy was not able to be a performed because of  the cervical stenosis.  Therefore, we are getting a repeat ultrasound on  the patient to see if we can evaluate the endometrial canal and perhaps  a D&C might be necessary.  In addition, I am going to get an [facility], LH,  and a CA-125 and have the patient return in 2 weeks for reevaluation.  Breast examined, no dominant masses, no nipple discharge, and  symmetrical exam.   IMPRESSION:  This is a 60 year old woman with large fibroids, seem to  have decreased in size over 2 years but continues to have regular  periods and cervical stenosis.           ______________________________  Argentina Donovan,  MD     PR/MEDQ  D:  07/18/2007  T:  07/18/2007  Job:  [id number]

## 2010-06-14 NOTE — Group Therapy Note (Signed)
Briana, Ortiz NO.:  [id number]   MEDICAL RECORD NO.:  [id number]          PATIENT TYPE:  WOC   LOCATION:  [facility]                   FACILITY:  [facility]   PHYSICIAN:  Tinnie Gens, MD        DATE OF BIRTH:  1950/06/21   DATE OF SERVICE:  08/01/2007                                  CLINIC NOTE   CHIEF COMPLAINT:  Followup results.   HISTORY OF PRESENT ILLNESS:  The patient is a 60 year old gravida 1,  para 1, who has a history of fibroid uterus, who was seen by Dr. Tenny Craw a  couple of weeks ago and underwent pelvic sonography, FSH, LH, and  attempted endometrial biopsy which failed for menorrhagia.  It is  hopeful that with ultrasound, we will be able to evaluate endometrial  canal.   The patient's results are reviewed with her today.  They included a  normal Pap smear, an FSH of 55, an LH of 52, a CA-125 is 6.5, which are  pretty normal.  Ultrasound shows 14.6 x 7.8 x 12.7, that is probably  somewhat smaller than her last ultrasound.  The endometrial canal could  not be seen secondary to shallowing from her fibroid.   PHYSICAL EXAMINATION:  VITAL SIGNS:  As noted in the chart.  GENERAL:  She is a well-nourished and well-nourished female in no acute  distress.   IMPRESSION:  1. Fibroid uterus.  2. Menorrhagia.   PLAN:  We will try Cytotec per vagina the night prior to her office  visit to see if this helps to dilate her cervix in order to proceed with  endometrial biopsy.  If this is normal, I do not think she needs to do  anything else about the normal fibroid.  The uterus is normally  shrinking with menopause.  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           ______________________________  Tinnie Gens, MD     TP/MEDQ  D:  08/01/2007  T:  08/02/2007  Job:  [id number]

## 2010-06-14 NOTE — Group Therapy Note (Signed)
Briana Ortiz, VALLELY NO.:  [id number]   MEDICAL RECORD NO.:  [id number]          PATIENT TYPE:  WOC   LOCATION:  [facility]                   FACILITY:  [facility]   PHYSICIAN:  Elsie Lincoln, MD      DATE OF BIRTH:  [birth year]   DATE OF SERVICE:  09/18/2007                                  CLINIC NOTE   The patient is a 60 year old female who presents for endometrial biopsy.  They tried last visit, but it was unsuccessful.  The patient was put  Cytotec per vagina.  We were successful at this time.  UPT was negative  before we started, and also informed consent was obtained.  The cervix  was grasped with a single-toothed tenaculum and the cervix was cleaned.  The uterus sounded to 11 cm and one pass was made with copious amounts  of tissue.  The patient tolerated the procedure well.  TSH has not been  drawn, so we will draw that and the patient missed her mammogram  appointment last week, so we will reschedule that for her.  She is to  come back in 2 weeks for results.  Of note, she has not had any bleeding  since May, so this has been about 3 months with no bleeding and she is  menopausal, so she may now be just in menopause.           ______________________________  Elsie Lincoln, MD     KL/MEDQ  D:  09/18/2007  T:  09/19/2007  Job:  [id number]

## 2010-06-17 NOTE — Group Therapy Note (Signed)
NAME:  Briana Ortiz, MAN NO.:  [id number]   MEDICAL RECORD NO.:  [id number]          PATIENT TYPE:  WOC   LOCATION:  [facility]                   FACILITY:  [facility]   PHYSICIAN:  Argentina Donovan, MD        DATE OF BIRTH:  1950-03-24   DATE OF SERVICE:                                    CLINIC NOTE   HISTORY OF PRESENT ILLNESS:  The patient is a 60 year old black female  gravida 1, para 1-0-0-1 with a child, age 5, who went into the MAU because  of suprapubic pain several days ago, and was noted to have large fibroids  which did not know she had.  She said her periods for the past few years  have been heavy, but she has a hemoglobin of 11.7 with a hematocrit of 35.2.  On examination, the abdomen is 2 fingerbreadths below the umbilicus.   TEST DATA:  Ultrasound reveals a large 16.0-cm uterus with an 8.0-cm fibroid  tumor.  Ovaries could not be evaluated.  There was no sign of  hydronephrosis.   DISCUSSION:  It was discussed with the patient buying time until menopause  and to take iron therapy.  She is willing to do that and hopeful that the  uterus will shrink down, and she may avoid hysterectomy.   IMPRESSION:  Symptomatic leiomyomata uteri.      PR/MEDQ  D:  04/05/2004  T:  04/05/2004  Job:  [id number]

## 2010-06-17 NOTE — Group Therapy Note (Signed)
NAME:  ESMA, KILTS NO.:  [id number]   MEDICAL RECORD NO.:  [id number]          PATIENT TYPE:  WOC   LOCATION:  [facility]                   FACILITY:  [facility]   PHYSICIAN:  Argentina Donovan, MD        DATE OF BIRTH:  [birth year]   DATE OF SERVICE:  07/19/2005                                    CLINIC NOTE   GYN CLINIC VISIT   The patient is a 60 year old African American female, gravida 1, para 1-0-0-  1, who was in over a year ago with a large leiomyomata.  Ultrasound revealed  a large, 16-cm uterus and an 8-cm fibroid tumor.  Ovaries could not be  evaluated.  There was no sign of hydronephrosis.  The patient has been  asymptomatic since that time.  The uterus has not grown, it is still  approximately 2 fingerbreadths below the umbilicus, and she is so close to  menopause that we have decided to observe her since she had a CBC within the  last month that was normal.  She does not have heavy periods, and she has no  pain, and hopefully within a year after menopause, the uterus will shrink  down significantly.   IMPRESSION:  Asymptomatic leiomyomata.  Patient to come in for 85-month  evaluation.           ______________________________  Argentina Donovan, MD     PR/MEDQ  D:  07/19/2005  T:  07/19/2005  Job:  [id number]

## 2010-06-21 ENCOUNTER — Encounter: Payer: Self-pay | Admitting: Internal Medicine

## 2010-06-29 ENCOUNTER — Other Ambulatory Visit: Payer: Self-pay | Admitting: *Deleted

## 2010-07-01 ENCOUNTER — Other Ambulatory Visit: Payer: Self-pay | Admitting: *Deleted

## 2010-07-01 MED ORDER — HYDROCHLOROTHIAZIDE 25 MG PO TABS: 25.0000 mg | ORAL_TABLET | Freq: Every day | ORAL | Status: AC

## 2010-07-01 NOTE — Telephone Encounter (Signed)
Spoke with pt.  Scheduling appointment now.

## 2010-07-01 NOTE — Telephone Encounter (Signed)
Patient needs to come in for an office visit. She was seen in 12/11 and told to follow up in 4 weeks and has not. I believe there are no shows. At that time her BP was 135/96. If she makes an appointment we can call in 1 month of a refill ONLY.

## 2010-07-06 ENCOUNTER — Encounter: Payer: Self-pay | Admitting: Internal Medicine

## 2010-07-06 ENCOUNTER — Ambulatory Visit (INDEPENDENT_AMBULATORY_CARE_PROVIDER_SITE_OTHER): Payer: Self-pay | Admitting: Internal Medicine

## 2010-07-06 VITALS — BP 165/96 | HR 57 | Temp 97.1°F | Ht 61.0 in | Wt 197.5 lb

## 2010-07-06 DIAGNOSIS — G5710 Meralgia paresthetica, unspecified lower limb: Secondary | ICD-10-CM

## 2010-07-06 DIAGNOSIS — I10 Essential (primary) hypertension: Secondary | ICD-10-CM

## 2010-07-06 MED ORDER — GABAPENTIN 600 MG PO TABS: 600.0000 mg | ORAL_TABLET | Freq: Three times a day (TID) | ORAL | Status: DC

## 2010-07-06 MED ORDER — LISINOPRIL 10 MG PO TABS: 10.0000 mg | ORAL_TABLET | Freq: Every day | ORAL | Status: DC

## 2010-07-06 NOTE — Patient Instructions (Signed)
Return in one month.  

## 2010-07-06 NOTE — Progress Notes (Signed)
  Subjective:    Patient ID: Briana Ortiz, female    DOB: 05/06/1950, 60 y.o.   MRN: [id number]  HPI 60 years old female, presents for follow up. She was not taking his BP medicine for some time but started them again. She reports having pain in the sciatic area. She takes neurontin at night time which helps but reports that the pain is not controlled during day time. She has no other complaint.    Review of Systems  Constitutional: Negative for fever, chills, activity change and appetite change.  HENT: Negative for nosebleeds, facial swelling, neck pain and tinnitus.   Eyes: Negative for pain, discharge and visual disturbance.  Respiratory: Negative for cough, chest tightness and shortness of breath.   Cardiovascular: Negative for chest pain and palpitations.  Gastrointestinal: Negative for nausea, vomiting, abdominal pain, blood in stool and abdominal distention.  Skin: Negative for rash.  Neurological: Negative for dizziness, seizures, weakness and headaches.  Psychiatric/Behavioral: Negative for suicidal ideas, confusion and agitation.       Objective:   Physical Exam  Constitutional: She is oriented to person, place, and time. She appears well-developed and well-nourished.  HENT:  Head: Normocephalic and atraumatic.  Right Ear: External ear normal.  Left Ear: External ear normal.  Eyes: Conjunctivae and EOM are normal. Pupils are equal, round, and reactive to light.  Neck: No JVD present. No tracheal deviation present. No thyromegaly present.  Cardiovascular: Normal rate, regular rhythm and normal heart sounds.  Exam reveals no gallop.   No murmur heard. Pulmonary/Chest: No respiratory distress. She has no wheezes. She has no rales. She exhibits no tenderness.  Abdominal: Soft. Bowel sounds are normal. She exhibits no distension and no mass. There is no tenderness. There is no rebound and no guarding.  Musculoskeletal: Normal range of motion. She exhibits no edema and no  tenderness.  Lymphadenopathy:    She has no cervical adenopathy.  Neurological: She is alert and oriented to person, place, and time. She has normal reflexes. No cranial nerve deficit. Coordination normal. GCS eye subscore is 4. GCS verbal subscore is 5. GCS motor subscore is 6.  Skin: No rash noted. No erythema.  Psychiatric: She has a normal mood and affect. Her behavior is normal. Thought content normal.          Assessment & Plan:

## 2010-07-06 NOTE — Assessment & Plan Note (Signed)
BP elevated, she takes BC powder for pain control which may have worsened the BP. Review of several reading through the past show diastolic hypertension. I will add in lisinopril. Return in one month. Check BMET at the time. Patient appears to have limited understanding. I have tried to explain her that if she tolerates lisinopril, she would be given lisinopril-hctz combination, which can save her copay money.

## 2010-08-01 ENCOUNTER — Other Ambulatory Visit: Payer: Self-pay | Admitting: Internal Medicine

## 2010-08-17 ENCOUNTER — Ambulatory Visit (INDEPENDENT_AMBULATORY_CARE_PROVIDER_SITE_OTHER): Payer: Self-pay | Admitting: Internal Medicine

## 2010-08-17 ENCOUNTER — Encounter: Payer: Self-pay | Admitting: Internal Medicine

## 2010-08-17 VITALS — BP 143/92 | HR 59 | Temp 97.6°F | Ht 61.0 in | Wt 196.3 lb

## 2010-08-17 DIAGNOSIS — K029 Dental caries, unspecified: Secondary | ICD-10-CM | POA: Insufficient documentation

## 2010-08-17 DIAGNOSIS — I10 Essential (primary) hypertension: Secondary | ICD-10-CM

## 2010-08-17 DIAGNOSIS — G5710 Meralgia paresthetica, unspecified lower limb: Secondary | ICD-10-CM

## 2010-08-17 MED ORDER — DICLOFENAC SODIUM 1 % TD GEL: 1.0000 "application " | Freq: Four times a day (QID) | TRANSDERMAL | Status: DC

## 2010-08-17 MED ORDER — LISINOPRIL-HYDROCHLOROTHIAZIDE 20-25 MG PO TABS: 1.0000 | ORAL_TABLET | Freq: Every day | ORAL | Status: DC

## 2010-08-17 MED ORDER — OMEPRAZOLE 20 MG PO CPDR: 20.0000 mg | DELAYED_RELEASE_CAPSULE | Freq: Every day | ORAL | Status: DC | PRN

## 2010-08-17 NOTE — Assessment & Plan Note (Signed)
There are multiple cavities and broken teeth. No evidence of infection at this time. -Will refer patient to dentistry

## 2010-08-17 NOTE — Assessment & Plan Note (Signed)
Adequately controlled but not at target goal. I am not sure why patient was placed on both benazepril as well as lisinopril. In addition she was also taking hydrochlorothiazide. I will simplify her regimen today by putting her on combo: lisinopril/HCTZ 20/25 mg by mouth daily. She will followup with me in 2 weeks and if her blood pressure is still elevated I will add amlodipine to her regimen. -Patient was instructed to stop lisinopril, HCTZ, and Benazepril -Will start lisinopril/HCTZ 20/25 mg by mouth daily -Will recheck blood pressure in 2 weeks

## 2010-08-17 NOTE — Assessment & Plan Note (Signed)
Still complaining of bilateral leg tenderness.   -Will try a trial of Voltaren gel qid

## 2010-08-17 NOTE — Patient Instructions (Signed)
STOP taking LISINOPRIL & BENAZEPRIL & HYDROCHLOROTHIAZIDE Start taking Lisinopril-hydrochlorothiazide 20-25mg  once daily Start using Voltaren gel on affected area 4 times daily Follow up in 6 weeks

## 2010-08-17 NOTE — Progress Notes (Signed)
History of present illness: ms. Nachtigal is a 60 year old woman with past medical history of hypertension who presents today for blood pressure followup as well as a referral for dental care.  She states that she has been compliant with her medication including benazepril, lisinopril, HCTZ.  She also wants a dental referral for her broken tooth as well as cavities. She has no other complaints today.  Denies any chest pain, shortness of breath, fever, chills, nausea vomiting, headache.  ROS: As per history of present illness  Physical examination: General: alert, well-developed, and cooperative to examination.  Mouth: poor dentition with multiple cavities and broken teeth Lungs: normal respiratory effort, no accessory muscle use, normal breath sounds, no crackles, and no wheezes. Heart: normal rate, regular rhythm, no murmur, no gallop, and no rub.  Abdomen: soft, non-tender, normal bowel sounds, no distention, no guarding, no rebound tenderness  Pulses: 2+ DP/PT pulses bilaterally Extremities: No cyanosis, clubbing, edema Neurologic: alert & oriented X3, cranial nerves II-XII intact, strength normal in all extremities, sensation intact to light touch, and gait normal.  Psych: Oriented X3, memory intact for recent and remote, normally interactive, good eye contact, not anxious appearing, and not depressed appearing.

## 2010-08-18 LAB — BASIC METABOLIC PANEL WITH GFR
BUN: 14 mg/dL (ref 6–23)
CO2: 31 mEq/L (ref 19–32)
Calcium: 10.3 mg/dL (ref 8.4–10.5)
Glucose, Bld: 89 mg/dL (ref 70–99)
Sodium: 140 mEq/L (ref 135–145)

## 2010-10-04 ENCOUNTER — Encounter: Payer: Self-pay | Admitting: Internal Medicine

## 2011-01-10 ENCOUNTER — Encounter: Payer: Self-pay | Admitting: Internal Medicine

## 2011-01-10 ENCOUNTER — Ambulatory Visit (HOSPITAL_COMMUNITY)
Admission: RE | Admit: 2011-01-10 | Discharge: 2011-01-10 | Disposition: A | Discharge status: Home / Self Care | Payer: Self-pay | Source: Ambulatory Visit | Attending: Internal Medicine | Admitting: Internal Medicine

## 2011-01-10 ENCOUNTER — Ambulatory Visit (INDEPENDENT_AMBULATORY_CARE_PROVIDER_SITE_OTHER): Payer: Self-pay | Admitting: Internal Medicine

## 2011-01-10 ENCOUNTER — Other Ambulatory Visit: Payer: Self-pay | Admitting: Internal Medicine

## 2011-01-10 VITALS — BP 155/99 | HR 60 | Temp 97.8°F | Ht 61.0 in | Wt 202.5 lb

## 2011-01-10 DIAGNOSIS — G5710 Meralgia paresthetica, unspecified lower limb: Secondary | ICD-10-CM

## 2011-01-10 DIAGNOSIS — I10 Essential (primary) hypertension: Secondary | ICD-10-CM

## 2011-01-10 DIAGNOSIS — M25569 Pain in unspecified knee: Secondary | ICD-10-CM

## 2011-01-10 DIAGNOSIS — M25552 Pain in left hip: Secondary | ICD-10-CM

## 2011-01-10 DIAGNOSIS — M25562 Pain in left knee: Secondary | ICD-10-CM | POA: Insufficient documentation

## 2011-01-10 DIAGNOSIS — Z23 Encounter for immunization: Secondary | ICD-10-CM

## 2011-01-10 DIAGNOSIS — L609 Nail disorder, unspecified: Secondary | ICD-10-CM

## 2011-01-10 DIAGNOSIS — K029 Dental caries, unspecified: Secondary | ICD-10-CM

## 2011-01-10 DIAGNOSIS — M25561 Pain in right knee: Secondary | ICD-10-CM

## 2011-01-10 DIAGNOSIS — M25559 Pain in unspecified hip: Secondary | ICD-10-CM

## 2011-01-10 MED ORDER — MELOXICAM 15 MG PO TABS: 15.0000 mg | ORAL_TABLET | Freq: Every day | ORAL | Status: DC

## 2011-01-10 NOTE — Patient Instructions (Addendum)
Get Xray of left hip, right & left knees Start Mobic 15 mg one tablet daily Apply ice/heating pad to your left knee Will refer to podiatrist for your right great toe nail problem Follow up in 1 month with Dr. Anselm Jungling

## 2011-01-10 NOTE — Assessment & Plan Note (Signed)
Not well-controlled, likely 2/2 to pain today.  Will continue current regimen and will reassess at next office visit.

## 2011-01-10 NOTE — Assessment & Plan Note (Addendum)
Probably osteoarthritis.  No signs of infection at this time.  Knee pain is greater in left knee than right with limited passive and active range of motion.   -Will get Xrays of knees and hip -Start Mobic 15 mg one tablet daily (kindney function is wnl) -Apply ice/heating pad for 20 mins about 2-3 times daily -If continue to have pain, may consider steroid injection or refer to sport medicine.   -Weight loss will help with her pain as well

## 2011-01-10 NOTE — Assessment & Plan Note (Signed)
Patient has not seen by dental clinic still.  She was told by the dental clinic that she needs to be on antibiotics prior to her cavities procedure.  She does not have an active infection nor does she have heart murmur which would have required prophylaxis oral abx.  Will check with dental clinic and I can call in abx if needed.

## 2011-01-10 NOTE — Progress Notes (Signed)
HPI: Briana Ortiz is a 60 yo woman with past medical history of GERD, hypertension, dental cavities, left hip pain presents today for bilateral knee pain. Her knee pain is worse on the left thigh, throbbing sensation, radiates down to her foot and worse with movement. She states that the pain has been there in the past 2 months and that she has been taking Aleve and ibuprofen without much relief. She denies any injury to her knees. She states that she cannot take Neurontin during work hours because it makes her drowsy. As for her dental cavities, patient has not been seen by the dental clinic as of yet because a referral issues along with the knee for taking an antibiotics even though they have not evaluated her.  She also complained of a right great toe nail is lifted from her nail bed and would like to see a podiatrist. She denies any drainage, redness, increased in warmth, discharge from her great toe. She also would like a flu vaccination today.  Review of system: As per history of present illness  Physical examination: General: alert, well-developed, and cooperative to examination.  Neck: supple, full ROM, no thyromegaly or nodules noted, no JVD Lungs: normal respiratory effort, no accessory muscle use, normal breath sounds, no crackles, and no wheezes. Heart: normal rate, regular rhythm, no murmur, no gallop, and no rub.  Abdomen: soft, non-tender, normal bowel sounds, no distention, no guarding and no splenomegaly.  Msk: Mild joint swelling of the left knee, no joint warmth, and no redness over joints. There are crepitations in both left and right knee on passive range of motion. Also there is limited range of motion left knee greater than right knee.  Positive straight leg raising test on the left leg.  Pulses: 2+ DP/PT pulses bilaterally Extremities: No cyanosis, clubbing, edema Neurologic: alert & oriented X3, cranial nerves II-XII intact, strength normal in all extremities, sensation intact to  light touch, and gait: Walking with a cane

## 2011-03-14 ENCOUNTER — Encounter: Payer: Self-pay | Admitting: Internal Medicine

## 2011-03-14 ENCOUNTER — Ambulatory Visit (INDEPENDENT_AMBULATORY_CARE_PROVIDER_SITE_OTHER): Payer: Self-pay | Admitting: Internal Medicine

## 2011-03-14 VITALS — BP 102/67 | HR 64 | Temp 97.0°F | Ht 61.0 in | Wt 201.4 lb

## 2011-03-14 DIAGNOSIS — M169 Osteoarthritis of hip, unspecified: Secondary | ICD-10-CM

## 2011-03-14 DIAGNOSIS — J3489 Other specified disorders of nose and nasal sinuses: Secondary | ICD-10-CM

## 2011-03-14 DIAGNOSIS — R0981 Nasal congestion: Secondary | ICD-10-CM

## 2011-03-14 DIAGNOSIS — M1612 Unilateral primary osteoarthritis, left hip: Secondary | ICD-10-CM

## 2011-03-14 DIAGNOSIS — I10 Essential (primary) hypertension: Secondary | ICD-10-CM

## 2011-03-14 DIAGNOSIS — K219 Gastro-esophageal reflux disease without esophagitis: Secondary | ICD-10-CM

## 2011-03-14 DIAGNOSIS — M25569 Pain in unspecified knee: Secondary | ICD-10-CM

## 2011-03-14 DIAGNOSIS — M25561 Pain in right knee: Secondary | ICD-10-CM

## 2011-03-14 DIAGNOSIS — G5710 Meralgia paresthetica, unspecified lower limb: Secondary | ICD-10-CM

## 2011-03-14 MED ORDER — LORATADINE 10 MG PO TBDP: 10.0000 mg | ORAL_TABLET | Freq: Every day | ORAL | Status: DC

## 2011-03-14 MED ORDER — EPHEDRINE HCL 25 MG PO TABS: 25.0000 mg | ORAL_TABLET | Freq: Every morning | ORAL | Status: DC

## 2011-03-14 MED ORDER — FLUTICASONE PROPIONATE 50 MCG/ACT NA SUSP: 2.0000 | Freq: Every day | NASAL | Status: DC

## 2011-03-14 MED ORDER — GABAPENTIN 600 MG PO TABS: 600.0000 mg | ORAL_TABLET | Freq: Three times a day (TID) | ORAL | Status: DC

## 2011-03-14 MED ORDER — FAMOTIDINE 40 MG PO TABS: 40.0000 mg | ORAL_TABLET | Freq: Every evening | ORAL | Status: DC

## 2011-03-14 MED ORDER — HYDROCODONE-ACETAMINOPHEN 5-500 MG PO TABS: 1.0000 | ORAL_TABLET | ORAL | Status: DC | PRN

## 2011-03-14 NOTE — Assessment & Plan Note (Signed)
Well-controlled, will continue Lisinopril-HCTZ 20/25mg  qd

## 2011-03-14 NOTE — Patient Instructions (Signed)
Will refer to Orthopedics Stop Mobic Start taking Vicodin one tablet every 4 hours as needed for pain, but it may cause drowsiness so you should take it at bedtime Start taking Claritin, Ephedrine, and Flonase for your nasal congestion Follow up with Dr. Anselm Jungling in 3 months

## 2011-03-14 NOTE — Assessment & Plan Note (Signed)
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   -Stop Mobic because pt states that it does not really help her pain -Pain control with VIcodin 5/500mg  po q4hr prn pain #90 3RFs, may need pain contract if she does not have surgery -Refer to orthopedics

## 2011-03-14 NOTE — Assessment & Plan Note (Signed)
Stable, will continue Gabapentin current dose because it does help relieve her symptoms

## 2011-03-14 NOTE — Assessment & Plan Note (Addendum)
No signs of infection as this time, afebrile, no cough.  All of her symptoms started 1 day ago and physical exam is consistent with allergic picture. -Will start Claritin 10mg  po qd -Ephedrine 25mg  po qdaily x 1 week -Flonase Nasal spray, 2 sprays in each nostril daily -Saline Nasal Spray -If symptoms do not resolve, may need abx if it is superimposed bacterial infection

## 2011-03-14 NOTE — Assessment & Plan Note (Signed)
Hip and knee Xrays reviewed with patient.  She has pain with limited ROM.  Mobic does not really help -Will refer to orthopedics for possible hip replacement as well as knee replacement -Give Vicodin 5/500mg  po q4hr prn pain#90, patient instructed to take it at night so she would not be drowsy -I will follow up with patient in 3 months

## 2011-03-14 NOTE — Progress Notes (Signed)
HPI: Briana Ortiz is a 61 yo W with PMH of HTN, meralgia paresthetica, GERD, bilateral knee pain, dental cavities presents today for follow up.  Xray of of bilaterally knees in December 2012 showed advanced tricompartmental degenerative changes, most advanced in the patellofemoral compartment, and Left Hip Xray showed "Severe left hip osteoarthritis. The possibility of underlying osteonecrosis cannot be excluded, although no acute findings are identified." Has a cold which started yesterday, no fever, nasal congestion with clear drainage, sneezing.  Denies any ear/eye pain/headache/cough/SOB/chestpain. Also wants to fill out form for scat.  ROS: as per HPI  PE: General: alert, well-developed, and cooperative to examination, walking with a cane. HEENT: nose with clear drainage, erythematous nasal mucosa, no oral exudate, no frontal or maxillary sinus tenderness. Lungs: normal respiratory effort, no accessory muscle use, normal breath sounds, no crackles, and no wheezes. Heart: normal rate, regular rhythm, no murmur, no gallop, and no rub.  Abdomen: soft, non-tender, normal bowel sounds, no distention, no guarding, no rebound tenderness [id number] ROM of bilateral knees with crepitus.  Limited ROM of left hip with tenderness to palpation. Pulses: 2+ DP/PT pulses bilaterally Extremities: No cyanosis, clubbing, edema Neurologic:nonfocal

## 2011-03-14 NOTE — Assessment & Plan Note (Signed)
Stable, will change from omeprazole to pepcid due to cost.

## 2011-03-17 ENCOUNTER — Other Ambulatory Visit: Payer: Self-pay | Admitting: Internal Medicine

## 2011-03-20 ENCOUNTER — Telehealth: Payer: Self-pay | Admitting: *Deleted

## 2011-03-20 ENCOUNTER — Other Ambulatory Visit: Payer: Self-pay | Admitting: *Deleted

## 2011-03-20 DIAGNOSIS — I10 Essential (primary) hypertension: Secondary | ICD-10-CM

## 2011-03-20 NOTE — Telephone Encounter (Signed)
This was for refill on lisinopril.  Request has been made

## 2011-03-21 MED ORDER — LISINOPRIL-HYDROCHLOROTHIAZIDE 20-25 MG PO TABS: 1.0000 | ORAL_TABLET | Freq: Every day | ORAL | Status: DC

## 2011-04-19 ENCOUNTER — Other Ambulatory Visit: Payer: Self-pay | Admitting: Internal Medicine

## 2011-04-19 DIAGNOSIS — Z1231 Encounter for screening mammogram for malignant neoplasm of breast: Secondary | ICD-10-CM

## 2011-05-18 ENCOUNTER — Ambulatory Visit (HOSPITAL_COMMUNITY)
Admission: RE | Admit: 2011-05-18 | Discharge: 2011-05-18 | Disposition: A | Discharge status: Home / Self Care | Payer: Self-pay | Source: Ambulatory Visit | Attending: Internal Medicine | Admitting: Internal Medicine

## 2011-05-18 DIAGNOSIS — Z1231 Encounter for screening mammogram for malignant neoplasm of breast: Secondary | ICD-10-CM | POA: Insufficient documentation

## 2011-06-19 ENCOUNTER — Ambulatory Visit (INDEPENDENT_AMBULATORY_CARE_PROVIDER_SITE_OTHER): Payer: Self-pay | Admitting: Family

## 2011-06-19 ENCOUNTER — Encounter: Payer: Self-pay | Admitting: Family

## 2011-06-19 DIAGNOSIS — Z01419 Encounter for gynecological examination (general) (routine) without abnormal findings: Secondary | ICD-10-CM

## 2011-06-19 NOTE — Progress Notes (Signed)
  Subjective:     Briana Ortiz is a 61 y.o. female and is here for a comprehensive physical exam. The patient reports no problems. Pt continues appointment with primary care provider for management of high blood pressure.  Reports having mammogram last month - normal.  Also, reports having colonoscopy last year - normal.  No problems or concerns.  History   Social History  . Marital Status: Single    Spouse Name: N/A    Number of Children: N/A  . Years of Education: N/A   Occupational History  . Not on file.   Social History Main Topics  . Smoking status: Never Smoker   . Smokeless tobacco: Never Used  . Alcohol Use: 0.0 oz/week    5-6 Cans of beer per week  . Drug Use: No  . Sexually Active: Not Currently    Birth Control/ Protection: None   Other Topics Concern  . Not on file   Social History Narrative   Financial assistance approved for 100% discount at [facility] and [facility]  December 30, 2009 2:50 PM   Health Maintenance  Topic Date Due  . Pap Smear  08/21/1970  . Colonoscopy  08/21/2002  . Influenza Vaccine  10/31/2011  . Tetanus/tdap  07/21/2012  . Mammogram  05/17/2013    The following portions of the patient's history were reviewed and updated as appropriate: allergies, current medications, past family history, past medical history, past social history, past surgical history and problem list.  Review of Systems Pertinent items are noted in HPI.   Objective:    General appearance: alert, cooperative and appears stated age Head: Normocephalic, without obvious abnormality, atraumatic Neck: no adenopathy, no carotid bruit, no JVD, supple, symmetrical, trachea midline and thyroid not enlarged, symmetric, no tenderness/mass/nodules Lungs: clear to auscultation bilaterally Breasts: normal appearance, no masses or tenderness Heart: regular rate and rhythm, S1, S2 normal, no murmur, click, rub or gallop Abdomen: soft, non-tender; bowel sounds normal; no  masses,  no organomegaly Pelvic: cervix normal in appearance, external genitalia normal, no adnexal masses or tenderness, rectovaginal septum normal and vagina normal without discharge Extremities: extremities normal, atraumatic, no cyanosis or edema Skin: Skin color, texture, turgor normal. No rashes or lesions    Assessment:    Healthy female exam.      Plan:  Reviewed with pt the new Pap Smear Guidelines - pt has no history of abnormal pap smear Pap smears will be every three years; explained importance of coming for Well Woman Exam for health maintenance and inspection of genitalia.

## 2011-08-15 ENCOUNTER — Encounter: Payer: Self-pay | Admitting: Internal Medicine

## 2011-08-15 ENCOUNTER — Ambulatory Visit (INDEPENDENT_AMBULATORY_CARE_PROVIDER_SITE_OTHER): Payer: Self-pay | Admitting: Internal Medicine

## 2011-08-15 VITALS — BP 112/74 | HR 55 | Temp 98.2°F | Wt 191.0 lb

## 2011-08-15 DIAGNOSIS — M169 Osteoarthritis of hip, unspecified: Secondary | ICD-10-CM

## 2011-08-15 DIAGNOSIS — M1610 Unilateral primary osteoarthritis, unspecified hip: Secondary | ICD-10-CM

## 2011-08-15 DIAGNOSIS — M1612 Unilateral primary osteoarthritis, left hip: Secondary | ICD-10-CM

## 2011-08-15 DIAGNOSIS — I10 Essential (primary) hypertension: Secondary | ICD-10-CM

## 2011-08-15 LAB — CBC
HCT: 42.8 % (ref 36.0–46.0)
Hemoglobin: 14.4 g/dL (ref 12.0–15.0)
MCH: 30.9 pg (ref 26.0–34.0)
MCHC: 33.6 g/dL (ref 30.0–36.0)
MCV: 91.8 fL (ref 78.0–100.0)
RBC: 4.66 MIL/uL (ref 3.87–5.11)

## 2011-08-15 MED ORDER — HYDROCODONE-ACETAMINOPHEN 7.5-500 MG PO TABS: 1.0000 | ORAL_TABLET | Freq: Four times a day (QID) | ORAL | Status: DC | PRN

## 2011-08-15 MED ORDER — DICLOFENAC SODIUM 75 MG PO TBEC: 75.0000 mg | DELAYED_RELEASE_TABLET | Freq: Two times a day (BID) | ORAL | Status: DC

## 2011-08-15 NOTE — Assessment & Plan Note (Signed)
Patient has not been able to see an orthopedist as of yet due to insurance issues.  She reports that her pain is not well controlled with 5 mg of Vicodin and that sometimes she has to take 2 tablets for pain control. She normally takes about 2-3 tablet daily to control her pain. She would like to know if we can increase the dosage was switched to a different medication. -Will refer her to physical therapy which should help with her osteoarthritis -Will increase Vicodin to 7.5/500 mg by mouth every 6 hours #90 with 3 refills -Will refer to orthopedics for further evaluation and treatment -I will prescribe NSAIDs-Diclofenac 75mg  po bid with meals-- will check Cr level today.  Last Cr in 2012 was wnl.

## 2011-08-15 NOTE — Patient Instructions (Addendum)
Start taking Diclofenac 75mg  twice daily with meals Increase her Vicodin to 7.5 mg one tablet every 6 hour as needed for pain Will continue to work on our referral for orthopedics Will get lab work today and I will call you back with any abnormal lab results Followup with Dr. Anselm Jungling in 4-6 months

## 2011-08-15 NOTE — Progress Notes (Signed)
History of present illness: Briana Ortiz is a 61 year old woman with past medical history of hypertension, meralgia paresthetica, GERD, severe osteoarthritis of bilateral knee, left hip presents today for followup. She reports that her pain is not well controlled Vicodin 5 mg and would like to increase the fish or change to a new medication. She reports taking 2 tablets to take the edge off her pain. She normally takes about 2-3 tablets daily. She reports still working, stand on her feet all day. She still has not been able to see an orthopedist.   She had her colonoscopy.  Had Pap smear at [facility].  No other complaints today.  ROS: as per HPI  PE: General: alert, well-developed, and cooperative to examination.  Mouth: poor dentition with multiple cavities Lungs: normal respiratory effort, no accessory muscle use, normal breath sounds, no crackles, and no wheezes. Heart: normal rate, regular rhythm, no murmur, no gallop, and no rub.  Abdomen: soft, non-tender, normal bowel sounds, no distention, no guarding, no rebound tenderness Extremities: No cyanosis, clubbing, edema.  Left hip: limited ROM, tenderness with passive and active movement.  + crepitus on bilateral knees. Neurologic: alert & oriented X3, cranial nerves II-XII intact, strength normal in all extremities, sensation intact to light touch

## 2011-08-15 NOTE — Assessment & Plan Note (Signed)
Well-controlled. Will continue lisinopril/hydrochlorothiazide 20/25 mg by mouth daily

## 2011-08-16 LAB — COMPLETE METABOLIC PANEL WITH GFR
ALT: 12 U/L (ref 0–35)
AST: 15 U/L (ref 0–37)
CO2: 32 mEq/L (ref 19–32)
Creat: 1.09 mg/dL (ref 0.50–1.10)
GFR, Est African American: 65 mL/min
GFR, Est Non African American: 56 mL/min — ABNORMAL LOW
Glucose, Bld: 49 mg/dL — ABNORMAL LOW (ref 70–99)
Sodium: 141 mEq/L (ref 135–145)
Total Bilirubin: 0.4 mg/dL (ref 0.3–1.2)
Total Protein: 7.9 g/dL (ref 6.0–8.3)

## 2011-08-16 LAB — LIPID PANEL
Cholesterol: 184 mg/dL (ref 0–200)
LDL Cholesterol: 99 mg/dL (ref 0–99)
Total CHOL/HDL Ratio: 3.5 Ratio
VLDL: 32 mg/dL (ref 0–40)

## 2011-08-22 ENCOUNTER — Other Ambulatory Visit: Payer: Self-pay | Admitting: Internal Medicine

## 2011-08-22 DIAGNOSIS — E162 Hypoglycemia, unspecified: Secondary | ICD-10-CM

## 2011-08-28 ENCOUNTER — Ambulatory Visit: Payer: Self-pay

## 2011-08-28 DIAGNOSIS — E162 Hypoglycemia, unspecified: Secondary | ICD-10-CM

## 2011-08-29 LAB — BASIC METABOLIC PANEL WITH GFR
BUN: 16 mg/dL (ref 6–23)
Chloride: 102 mEq/L (ref 96–112)
Creat: 0.92 mg/dL (ref 0.50–1.10)
GFR, Est Non African American: 68 mL/min
Glucose, Bld: 65 mg/dL — ABNORMAL LOW (ref 70–99)
Potassium: 4 mEq/L (ref 3.5–5.3)

## 2011-09-02 LAB — PROINSULIN/INSULIN RATIO
Insulin: 10 u[IU]/mL (ref 3–19)
Proinsulin: 7 pmol/L (ref <=26.8)

## 2011-09-04 ENCOUNTER — Ambulatory Visit: Payer: Self-pay | Attending: Internal Medicine

## 2011-09-04 DIAGNOSIS — IMO0001 Reserved for inherently not codable concepts without codable children: Secondary | ICD-10-CM | POA: Insufficient documentation

## 2011-09-04 DIAGNOSIS — R262 Difficulty in walking, not elsewhere classified: Secondary | ICD-10-CM | POA: Insufficient documentation

## 2011-09-04 DIAGNOSIS — M25559 Pain in unspecified hip: Secondary | ICD-10-CM | POA: Insufficient documentation

## 2011-09-04 DIAGNOSIS — M25569 Pain in unspecified knee: Secondary | ICD-10-CM | POA: Insufficient documentation

## 2011-09-13 LAB — SULFONYLUREA HYPOGLYCEMICS PANEL, URINE
Acetohexamide, ur: NEGATIVE
Glipizide, ur: NEGATIVE
Tolazamide: NEGATIVE
Tolbutamide, ur: NEGATIVE

## 2011-09-25 ENCOUNTER — Encounter: Payer: Self-pay | Admitting: Rehabilitation

## 2011-09-28 ENCOUNTER — Ambulatory Visit: Payer: Self-pay | Admitting: Rehabilitation

## 2011-10-19 ENCOUNTER — Ambulatory Visit: Payer: Self-pay | Attending: Internal Medicine

## 2011-10-19 DIAGNOSIS — M169 Osteoarthritis of hip, unspecified: Secondary | ICD-10-CM | POA: Insufficient documentation

## 2011-10-19 DIAGNOSIS — M25559 Pain in unspecified hip: Secondary | ICD-10-CM | POA: Insufficient documentation

## 2011-10-19 DIAGNOSIS — IMO0001 Reserved for inherently not codable concepts without codable children: Secondary | ICD-10-CM | POA: Insufficient documentation

## 2011-10-19 DIAGNOSIS — R262 Difficulty in walking, not elsewhere classified: Secondary | ICD-10-CM | POA: Insufficient documentation

## 2011-10-19 DIAGNOSIS — M1610 Unilateral primary osteoarthritis, unspecified hip: Secondary | ICD-10-CM | POA: Insufficient documentation

## 2011-10-19 DIAGNOSIS — M25569 Pain in unspecified knee: Secondary | ICD-10-CM | POA: Insufficient documentation

## 2011-11-14 ENCOUNTER — Ambulatory Visit (INDEPENDENT_AMBULATORY_CARE_PROVIDER_SITE_OTHER): Payer: Self-pay | Admitting: Internal Medicine

## 2011-11-14 ENCOUNTER — Encounter: Payer: Self-pay | Admitting: Internal Medicine

## 2011-11-14 VITALS — BP 126/84 | HR 64 | Temp 97.0°F | Wt 190.9 lb

## 2011-11-14 DIAGNOSIS — R300 Dysuria: Secondary | ICD-10-CM

## 2011-11-14 DIAGNOSIS — N390 Urinary tract infection, site not specified: Secondary | ICD-10-CM | POA: Insufficient documentation

## 2011-11-14 DIAGNOSIS — E162 Hypoglycemia, unspecified: Secondary | ICD-10-CM

## 2011-11-14 DIAGNOSIS — Z23 Encounter for immunization: Secondary | ICD-10-CM

## 2011-11-14 LAB — POCT URINALYSIS DIPSTICK
Bilirubin, UA: NEGATIVE
Ketones, UA: NEGATIVE
Spec Grav, UA: >=1.03
pH, UA: 5

## 2011-11-14 MED ORDER — CIPROFLOXACIN HCL 500 MG PO TABS: 500.0000 mg | ORAL_TABLET | Freq: Two times a day (BID) | ORAL | Status: DC

## 2011-11-14 NOTE — Assessment & Plan Note (Signed)
Uncomplicated UTI. Symptomatic with dysuria and burning sensation and increased in urinary frequency.  Urine dipstick shows trace blood and trace leukocytes. -Will treat with Ciprofloxacin 500mg  bid x 5 days -Will send for UA and urine culture.

## 2011-11-14 NOTE — Patient Instructions (Addendum)
Start taking Cipro 500mg  one tablet twice daily x 5 days I will send for a urine culture and will call you with results if we need to change your antibiotics Follow up as needed

## 2011-11-14 NOTE — Progress Notes (Signed)
HPI: Briana Ortiz is a 61 yo woman with history of OA, HTN presents today for burning and dysuria x 1 week in duration, not sexually active since Feb, no discharge. + itching.  Increased in urinary frequency.  No fever or chills, abdominal pain.   She had a pap smear at [facility] last year.  Reports normal results.  Patient wants flu shot today. Had colonoscopy last year. No other complaints today. Of note, her glu was noted to be 49 and follow up glu was 65.  Her work up was essentially neg. This likely due to malnutrition.  Her CBG today was 105.   ROS: as per HPI  PE: General: alert, well-developed, and cooperative to examination.  Lungs: normal respiratory effort, no accessory muscle use, normal breath sounds, no crackles, and no wheezes. Heart: normal rate, regular rhythm, no murmur, no gallop, and no rub.  Abdomen: soft, non-tender, normal bowel sounds, no distention, no guarding, no rebound tenderness Msk: no joint swelling, no joint warmth, and no redness over joints.  Pulses: 2+ DP/PT pulses bilaterally Extremities: No cyanosis, clubbing, edema Neurologic:nonfocal

## 2011-11-15 LAB — URINALYSIS, ROUTINE W REFLEX MICROSCOPIC
Bilirubin Urine: NEGATIVE
Glucose, UA: NEGATIVE mg/dL
pH: 5.5 (ref 5.0–8.0)

## 2011-11-15 LAB — URINALYSIS, MICROSCOPIC ONLY
Bacteria, UA: NONE SEEN
Casts: NONE SEEN
Crystals: NONE SEEN
WBC, UA: >50 WBC/hpf — AB (ref <3)

## 2011-11-17 LAB — URINE CULTURE

## 2011-11-19 ENCOUNTER — Other Ambulatory Visit: Payer: Self-pay | Admitting: Internal Medicine

## 2011-11-19 MED ORDER — FLUCONAZOLE 150 MG PO TABS: 150.0000 mg | ORAL_TABLET | Freq: Once | ORAL | Status: DC

## 2011-11-19 NOTE — Progress Notes (Signed)
She has been itching in her vagina area. I sent Rx for Diflucan to her pharmacy.

## 2011-11-27 ENCOUNTER — Other Ambulatory Visit: Payer: Self-pay | Admitting: *Deleted

## 2011-11-27 MED ORDER — HYDROCODONE-ACETAMINOPHEN 7.5-500 MG PO TABS
1.0000 | ORAL_TABLET | Freq: Four times a day (QID) | ORAL | Status: DC | PRN
Start: 2011-11-27 — End: 2012-03-17

## 2011-11-29 NOTE — Telephone Encounter (Signed)
Rx called in to pharmacy. Stanton Kidney Ruthanna Macchia RN 11/29/11 3:15PM

## 2011-12-13 ENCOUNTER — Other Ambulatory Visit: Payer: Self-pay | Admitting: *Deleted

## 2011-12-13 DIAGNOSIS — I10 Essential (primary) hypertension: Secondary | ICD-10-CM

## 2011-12-13 MED ORDER — LISINOPRIL-HYDROCHLOROTHIAZIDE 20-25 MG PO TABS: 1.0000 | ORAL_TABLET | Freq: Every day | ORAL | Status: DC

## 2011-12-13 NOTE — Telephone Encounter (Signed)
Please sch appt April or May with PCP

## 2011-12-13 NOTE — Telephone Encounter (Signed)
Also, pt requesting refill on - Hydrocodone 7.5/500mg  Take 1 tab q6hr PRN. Thanks

## 2011-12-13 NOTE — Telephone Encounter (Signed)
Pt is also requesting refill on Hydrocodone 7.5/500mg  - take 1 tab by mouth every 6hr PRN. Thanks

## 2011-12-14 ENCOUNTER — Telehealth: Payer: Self-pay | Admitting: *Deleted

## 2011-12-14 NOTE — Telephone Encounter (Signed)
Pt requesting a refill on Hydrocodone 7.5/500mg   Take 1 tab q6hr PRN  Thanks

## 2011-12-15 ENCOUNTER — Other Ambulatory Visit: Payer: Self-pay | Admitting: Internal Medicine

## 2011-12-15 MED ORDER — HYDROCODONE-ACETAMINOPHEN 7.5-500 MG PO TABS: 1.0000 | ORAL_TABLET | Freq: Four times a day (QID) | ORAL | Status: DC | PRN

## 2011-12-15 NOTE — Telephone Encounter (Signed)
Lortab rx refilled by Dr Eben Burow- called to [facility]; pt called and informed.

## 2012-01-03 ENCOUNTER — Ambulatory Visit: Payer: Self-pay | Admitting: Internal Medicine

## 2012-01-04 ENCOUNTER — Other Ambulatory Visit: Payer: Self-pay | Admitting: Internal Medicine

## 2012-01-04 MED ORDER — HYDROCODONE-ACETAMINOPHEN 7.5-325 MG PO TABS
1.0000 | ORAL_TABLET | Freq: Four times a day (QID) | ORAL | Status: DC | PRN
Start: 2012-01-04 — End: 2012-01-08

## 2012-01-08 ENCOUNTER — Other Ambulatory Visit: Payer: Self-pay | Admitting: *Deleted

## 2012-01-08 ENCOUNTER — Other Ambulatory Visit: Payer: Self-pay | Admitting: Internal Medicine

## 2012-01-08 MED ORDER — HYDROCODONE-ACETAMINOPHEN 7.5-325 MG PO TABS: 1.0000 | ORAL_TABLET | Freq: Four times a day (QID) | ORAL | Status: DC | PRN

## 2012-01-09 NOTE — Telephone Encounter (Signed)
Rx called in to pharmacy - on med list - do not refill 01/14/12.

## 2012-02-07 ENCOUNTER — Other Ambulatory Visit: Payer: Self-pay | Admitting: *Deleted

## 2012-02-08 MED ORDER — HYDROCODONE-ACETAMINOPHEN 7.5-325 MG PO TABS: 1.0000 | ORAL_TABLET | Freq: Four times a day (QID) | ORAL | Status: DC | PRN

## 2012-02-09 ENCOUNTER — Ambulatory Visit: Payer: Self-pay

## 2012-02-12 NOTE — Telephone Encounter (Signed)
Rx called in to pharmacy and pt aware. 

## 2012-02-15 ENCOUNTER — Ambulatory Visit: Payer: Self-pay

## 2012-02-29 ENCOUNTER — Other Ambulatory Visit: Payer: Self-pay | Admitting: Orthopedic Surgery

## 2012-03-05 ENCOUNTER — Encounter (HOSPITAL_COMMUNITY): Payer: Self-pay | Admitting: Pharmacy Technician

## 2012-03-06 ENCOUNTER — Encounter: Payer: Self-pay | Admitting: Internal Medicine

## 2012-03-06 ENCOUNTER — Ambulatory Visit (INDEPENDENT_AMBULATORY_CARE_PROVIDER_SITE_OTHER): Payer: PRIVATE HEALTH INSURANCE | Admitting: Internal Medicine

## 2012-03-06 VITALS — BP 129/79 | HR 58 | Temp 96.6°F | Ht 61.0 in | Wt 187.9 lb

## 2012-03-06 DIAGNOSIS — I10 Essential (primary) hypertension: Secondary | ICD-10-CM

## 2012-03-06 DIAGNOSIS — Z01818 Encounter for other preprocedural examination: Secondary | ICD-10-CM | POA: Insufficient documentation

## 2012-03-06 NOTE — Assessment & Plan Note (Addendum)
This patient is a 62 yo obese african [place] woman who is able to walk >2 blocks, does not smoke or use illicit drugs/acohol, and has no personal or family history of anesthetic complications.  Her functional capacity is between 4-10 METS. She will be undergoing electice orthopedic surgery-hip replacement which is surgery specific risk of 1-5% (intermediate).    Revised cardiac risk index - To simplify the prediction of risk, Briana Ortiz monitored [id number] patients (mean age 18) undergoing elective major noncardiac procedures and identified six independent predictors of major cardiac complications (defined as myocardial infarction, pulmonary edema, ventricular fibrillation or primary cardiac arrest, and complete heart block; all cause mortality was not included): High-risk type of surgery (intraperitoneal, intrathoracic, or suprainguinal vascular surgery): NO History of ischemic heart disease (history of MI or a positive exercise test, current complaint of chest pain considered to be secondary to myocardial ischemia, use of nitrate therapy, or ECG with pathological Q waves; do not count prior coronary revascularization procedure unless one of the other criteria for ischemic heart disease is present) : NO History of HF: NO History of cerebrovascular disease: NO  Diabetes mellitus requiring treatment with insulin: NO  Preoperative serum creatinine >2.0 mg/dL (161 mol/L): NO  Patient is a low-intermediate risk for coronary event for surgery.

## 2012-03-06 NOTE — Progress Notes (Signed)
Patient ID: QAMAR ROSMAN, female   DOB: Mar 07, 1950, 62 y.o.   MRN: [id number] HPI:Ms. Brodbeck is a 62 yo woman with pmh of HTN, GERD, osteoarthritis presents today for pre-op clearance for left hip surgery scheduled for 03/15/12 by Dr. Luiz Blare. She is doing well and has no complaints today.  She denies any SOB and is able to walk >2 blocks.  No history of CAD/MI or CVA or DM or renal insufficiency.     ROS: as per HPI  PE: General: alert, well-developed, and cooperative to examination.  Lungs: normal respiratory effort, no accessory muscle use, normal breath sounds, no crackles, and no wheezes. Heart: normal rate, regular rhythm, no murmur, no gallop, and no rub.  Abdomen: soft, non-tender, normal bowel sounds, no distention, no guarding, no rebound tenderness Neurologic: nonfocal, walking with a cane EXT: crepitus in both knees, no joint effusion noted Skin: turgor normal and no rashes.  Psych: appropriate

## 2012-03-07 NOTE — Addendum Note (Signed)
Addended by: Maura Crandall on: 03/07/2012 01:14 PM   Modules accepted: Orders

## 2012-03-08 ENCOUNTER — Encounter (HOSPITAL_COMMUNITY)
Admission: RE | Admit: 2012-03-08 | Discharge: 2012-03-08 | Disposition: A | Discharge status: Home / Self Care | Payer: Medicaid Other | Source: Ambulatory Visit | Attending: Orthopedic Surgery | Admitting: Orthopedic Surgery

## 2012-03-08 ENCOUNTER — Ambulatory Visit (HOSPITAL_COMMUNITY)
Admission: RE | Admit: 2012-03-08 | Discharge: 2012-03-08 | Disposition: A | Discharge status: Home / Self Care | Payer: Medicaid Other | Source: Ambulatory Visit | Attending: Orthopedic Surgery | Admitting: Orthopedic Surgery

## 2012-03-08 ENCOUNTER — Encounter (HOSPITAL_COMMUNITY): Payer: Self-pay

## 2012-03-08 HISTORY — DX: Unspecified osteoarthritis, unspecified site: M19.90

## 2012-03-08 LAB — CBC WITH DIFFERENTIAL/PLATELET
Eosinophils Relative: 2 % (ref 0–5)
HCT: 40 % (ref 36.0–46.0)
Hemoglobin: 13.7 g/dL (ref 12.0–15.0)
Lymphocytes Relative: 33 % (ref 12–46)
Lymphs Abs: 2.1 10*3/uL (ref 0.7–4.0)
MCH: 31.8 pg (ref 26.0–34.0)
MCV: 92.8 fL (ref 78.0–100.0)
Monocytes Relative: 4 % (ref 3–12)
Platelets: 202 10*3/uL (ref 150–400)
RBC: 4.31 MIL/uL (ref 3.87–5.11)
WBC: 6.4 10*3/uL (ref 4.0–10.5)

## 2012-03-08 LAB — SURGICAL PCR SCREEN
MRSA, PCR: NEGATIVE
Staphylococcus aureus: NEGATIVE

## 2012-03-08 LAB — COMPREHENSIVE METABOLIC PANEL
ALT: 10 U/L (ref 0–35)
Alkaline Phosphatase: 76 U/L (ref 39–117)
BUN: 16 mg/dL (ref 6–23)
CO2: 31 mEq/L (ref 19–32)
Calcium: 10.1 mg/dL (ref 8.4–10.5)
GFR calc Af Amer: 78 mL/min — ABNORMAL LOW (ref >90)
GFR calc non Af Amer: 68 mL/min — ABNORMAL LOW (ref >90)
Glucose, Bld: 81 mg/dL (ref 70–99)
Potassium: 3.5 mEq/L (ref 3.5–5.1)
Sodium: 140 mEq/L (ref 135–145)

## 2012-03-08 LAB — PROTIME-INR: Prothrombin Time: 13.1 seconds (ref 11.6–15.2)

## 2012-03-08 LAB — URINALYSIS, ROUTINE W REFLEX MICROSCOPIC
Glucose, UA: NEGATIVE mg/dL
Hgb urine dipstick: NEGATIVE
Specific Gravity, Urine: 1.026 (ref 1.005–1.030)

## 2012-03-08 LAB — ABO/RH: ABO/RH(D): B POS

## 2012-03-08 NOTE — Pre-Procedure Instructions (Addendum)
LAVREN LEWAN  03/08/2012   Your procedure is scheduled on: 03/15/12  Report to [facility] at 1115 AM.  Call this number if you have problems the morning of surgery: [phone]   Remember:   Do not eat food or drink liquids after midnight.   Take these medicines the morning of surgery with A SIP OF WATER: gabapentin, pepcid, pain med   Do not wear jewelry, make-up or nail polish.  Do not wear lotions, powders, or perfumes. You may wear deodorant.  Do not shave 48 hours prior to surgery. Men may shave face and neck.  Do not bring valuables to the hospital.  Contacts, dentures or bridgework may not be worn into surgery.  Leave suitcase in the car. After surgery it may be brought to your room.  For patients admitted to the hospital, checkout time is 11:00 AM the day of  discharge.   Patients discharged the day of surgery will not be allowed to drive  home.  Name and phone number of your driver:  Special Instructions: Incentive Spirometry - Practice and bring it with you on the day of surgery. Shower using CHG 2 nights before surgery and the night before surgery.  If you shower the day of surgery use CHG.  Use special wash - you have one bottle of CHG for all showers.  You should use approximately 1/3 of the bottle for each shower.   Please read over the following fact sheets that you were given: Pain Booklet, Coughing and Deep Breathing, Blood Transfusion Information, Total Joint Packet, MRSA Information and Surgical Site Infection Prevention

## 2012-03-14 MED ORDER — POVIDONE-IODINE 7.5 % EX SOLN
Freq: Once | CUTANEOUS | Status: DC
  Filled 2012-03-14: qty 118

## 2012-03-14 MED ORDER — CEFAZOLIN SODIUM-DEXTROSE 2-3 GM-% IV SOLR
2.0000 g | INTRAVENOUS | Status: AC
  Administered 2012-03-15: 2 g via INTRAVENOUS
  Filled 2012-03-14: qty 50

## 2012-03-15 ENCOUNTER — Ambulatory Visit (HOSPITAL_COMMUNITY): Payer: Medicaid Other | Admitting: Anesthesiology

## 2012-03-15 ENCOUNTER — Encounter (HOSPITAL_COMMUNITY): Payer: Self-pay | Admitting: Anesthesiology

## 2012-03-15 ENCOUNTER — Ambulatory Visit (HOSPITAL_COMMUNITY): Payer: Medicaid Other

## 2012-03-15 ENCOUNTER — Inpatient Hospital Stay (HOSPITAL_COMMUNITY)
Admission: RE | Admit: 2012-03-15 | Discharge: 2012-03-17 | DRG: 470 | Disposition: A | Discharge status: Home / Self Care | Payer: Medicaid Other | Source: Ambulatory Visit | Attending: Orthopedic Surgery | Admitting: Orthopedic Surgery

## 2012-03-15 ENCOUNTER — Encounter (HOSPITAL_COMMUNITY)
Admission: RE | Disposition: A | Discharge status: Home / Self Care | Payer: Self-pay | Source: Ambulatory Visit | Attending: Orthopedic Surgery

## 2012-03-15 DIAGNOSIS — Z01812 Encounter for preprocedural laboratory examination: Secondary | ICD-10-CM

## 2012-03-15 DIAGNOSIS — M1612 Unilateral primary osteoarthritis, left hip: Secondary | ICD-10-CM | POA: Diagnosis present

## 2012-03-15 DIAGNOSIS — K219 Gastro-esophageal reflux disease without esophagitis: Secondary | ICD-10-CM | POA: Diagnosis present

## 2012-03-15 DIAGNOSIS — Z79899 Other long term (current) drug therapy: Secondary | ICD-10-CM

## 2012-03-15 DIAGNOSIS — Z6836 Body mass index (BMI) 36.0-36.9, adult: Secondary | ICD-10-CM

## 2012-03-15 DIAGNOSIS — M25559 Pain in unspecified hip: Principal | ICD-10-CM | POA: Diagnosis present

## 2012-03-15 DIAGNOSIS — D259 Leiomyoma of uterus, unspecified: Secondary | ICD-10-CM | POA: Diagnosis present

## 2012-03-15 DIAGNOSIS — I10 Essential (primary) hypertension: Secondary | ICD-10-CM | POA: Diagnosis present

## 2012-03-15 DIAGNOSIS — Z01818 Encounter for other preprocedural examination: Secondary | ICD-10-CM

## 2012-03-15 HISTORY — PX: TOTAL HIP ARTHROPLASTY: SHX124

## 2012-03-15 SURGERY — ARTHROPLASTY, HIP, TOTAL, ANTERIOR APPROACH
Anesthesia: General | Site: Hip | Laterality: Left | Wound class: Clean

## 2012-03-15 MED ORDER — FENTANYL CITRATE 0.05 MG/ML IJ SOLN
25.0000 ug | INTRAMUSCULAR | Status: DC | PRN
  Administered 2012-03-15 (×3): 50 ug via INTRAVENOUS

## 2012-03-15 MED ORDER — POLYETHYLENE GLYCOL 3350 17 G PO PACK: 17.0000 g | PACK | Freq: Every day | ORAL | Status: DC | PRN

## 2012-03-15 MED ORDER — MIDAZOLAM HCL 2 MG/2ML IJ SOLN: 0.5000 mg | Freq: Once | INTRAMUSCULAR | Status: DC | PRN

## 2012-03-15 MED ORDER — METHOCARBAMOL 750 MG PO TABS: ORAL_TABLET | ORAL | Status: DC

## 2012-03-15 MED ORDER — FAMOTIDINE 40 MG PO TABS
40.0000 mg | ORAL_TABLET | Freq: Every day | ORAL | Status: DC
  Administered 2012-03-15 – 2012-03-17 (×3): 40 mg via ORAL
  Filled 2012-03-15 (×4): qty 1

## 2012-03-15 MED ORDER — FLEET ENEMA 7-19 GM/118ML RE ENEM: 1.0000 | ENEMA | Freq: Once | RECTAL | Status: AC | PRN

## 2012-03-15 MED ORDER — HYDROCHLOROTHIAZIDE 25 MG PO TABS
25.0000 mg | ORAL_TABLET | Freq: Every day | ORAL | Status: DC
  Administered 2012-03-16 – 2012-03-17 (×2): 25 mg via ORAL
  Filled 2012-03-15 (×2): qty 1

## 2012-03-15 MED ORDER — FAMOTIDINE 40 MG PO TABS
40.0000 mg | ORAL_TABLET | Freq: Every day | ORAL | Status: DC | PRN
  Filled 2012-03-15: qty 1

## 2012-03-15 MED ORDER — MENTHOL 3 MG MT LOZG: 1.0000 | LOZENGE | OROMUCOSAL | Status: DC | PRN

## 2012-03-15 MED ORDER — OXYCODONE HCL 5 MG PO TABS: 5.0000 mg | ORAL_TABLET | ORAL | Status: DC | PRN

## 2012-03-15 MED ORDER — DEXTROSE 5 % IV SOLN
INTRAVENOUS | Status: DC | PRN
  Administered 2012-03-15: 13:00:00 via INTRAVENOUS

## 2012-03-15 MED ORDER — GABAPENTIN 600 MG PO TABS
600.0000 mg | ORAL_TABLET | Freq: Three times a day (TID) | ORAL | Status: DC
  Administered 2012-03-15 – 2012-03-17 (×7): 600 mg via ORAL
  Filled 2012-03-15 (×9): qty 1

## 2012-03-15 MED ORDER — OXYCODONE-ACETAMINOPHEN 5-325 MG PO TABS: 1.0000 | ORAL_TABLET | Freq: Four times a day (QID) | ORAL | Status: DC | PRN

## 2012-03-15 MED ORDER — ARTIFICIAL TEARS OP OINT
TOPICAL_OINTMENT | OPHTHALMIC | Status: DC | PRN
  Administered 2012-03-15: 1 via OPHTHALMIC

## 2012-03-15 MED ORDER — LIDOCAINE HCL (CARDIAC) 20 MG/ML IV SOLN
INTRAVENOUS | Status: DC | PRN
  Administered 2012-03-15: 60 mg via INTRAVENOUS

## 2012-03-15 MED ORDER — ALUM & MAG HYDROXIDE-SIMETH 200-200-20 MG/5ML PO SUSP: 30.0000 mL | ORAL | Status: DC | PRN

## 2012-03-15 MED ORDER — PROMETHAZINE HCL 25 MG/ML IJ SOLN: 6.2500 mg | INTRAMUSCULAR | Status: DC | PRN

## 2012-03-15 MED ORDER — ONDANSETRON HCL 4 MG/2ML IJ SOLN: 4.0000 mg | Freq: Four times a day (QID) | INTRAMUSCULAR | Status: DC | PRN

## 2012-03-15 MED ORDER — ONDANSETRON HCL 4 MG PO TABS: 4.0000 mg | ORAL_TABLET | Freq: Four times a day (QID) | ORAL | Status: DC | PRN

## 2012-03-15 MED ORDER — ADULT MULTIVITAMIN W/MINERALS CH
1.0000 | ORAL_TABLET | Freq: Every day | ORAL | Status: DC
  Administered 2012-03-15 – 2012-03-17 (×3): 1 via ORAL
  Filled 2012-03-15 (×3): qty 1

## 2012-03-15 MED ORDER — LIDOCAINE HCL (CARDIAC) 20 MG/ML IV SOLN: INTRAVENOUS | Status: DC | PRN

## 2012-03-15 MED ORDER — NEOSTIGMINE METHYLSULFATE 1 MG/ML IJ SOLN
INTRAMUSCULAR | Status: DC | PRN
  Administered 2012-03-15: 4 mg via INTRAVENOUS

## 2012-03-15 MED ORDER — MIDAZOLAM HCL 5 MG/5ML IJ SOLN
INTRAMUSCULAR | Status: DC | PRN
  Administered 2012-03-15: 1 mg via INTRAVENOUS

## 2012-03-15 MED ORDER — 0.9 % SODIUM CHLORIDE (POUR BTL) OPTIME
TOPICAL | Status: DC | PRN
  Administered 2012-03-15 (×2): 1000 mL

## 2012-03-15 MED ORDER — ZOLPIDEM TARTRATE 5 MG PO TABS: 5.0000 mg | ORAL_TABLET | Freq: Every evening | ORAL | Status: DC | PRN

## 2012-03-15 MED ORDER — DOCUSATE SODIUM 100 MG PO CAPS
100.0000 mg | ORAL_CAPSULE | Freq: Two times a day (BID) | ORAL | Status: DC
  Administered 2012-03-15 – 2012-03-17 (×3): 100 mg via ORAL
  Filled 2012-03-15 (×6): qty 1

## 2012-03-15 MED ORDER — LISINOPRIL 20 MG PO TABS
20.0000 mg | ORAL_TABLET | Freq: Every day | ORAL | Status: DC
  Administered 2012-03-16 – 2012-03-17 (×2): 20 mg via ORAL
  Filled 2012-03-15 (×2): qty 1

## 2012-03-15 MED ORDER — ROCURONIUM BROMIDE 100 MG/10ML IV SOLN
INTRAVENOUS | Status: DC | PRN
  Administered 2012-03-15: 40 mg via INTRAVENOUS

## 2012-03-15 MED ORDER — METHOCARBAMOL 500 MG PO TABS: 500.0000 mg | ORAL_TABLET | Freq: Four times a day (QID) | ORAL | Status: DC | PRN

## 2012-03-15 MED ORDER — CEFAZOLIN SODIUM-DEXTROSE 2-3 GM-% IV SOLR
2.0000 g | Freq: Four times a day (QID) | INTRAVENOUS | Status: AC
  Administered 2012-03-15 – 2012-03-16 (×2): 2 g via INTRAVENOUS
  Filled 2012-03-15 (×2): qty 50

## 2012-03-15 MED ORDER — FENTANYL CITRATE 0.05 MG/ML IJ SOLN
INTRAMUSCULAR | Status: AC
  Administered 2012-03-15: 50 ug via INTRAVENOUS
  Filled 2012-03-15: qty 2

## 2012-03-15 MED ORDER — ASPIRIN EC 325 MG PO TBEC
325.0000 mg | DELAYED_RELEASE_TABLET | Freq: Two times a day (BID) | ORAL | Status: DC
  Administered 2012-03-15 – 2012-03-17 (×5): 325 mg via ORAL
  Filled 2012-03-15 (×6): qty 1

## 2012-03-15 MED ORDER — SODIUM CHLORIDE 0.9 % IV SOLN
INTRAVENOUS | Status: DC
  Administered 2012-03-15 – 2012-03-16 (×2): via INTRAVENOUS

## 2012-03-15 MED ORDER — LORATADINE 10 MG PO TBDP: 10.0000 mg | ORAL_TABLET | ORAL | Status: DC

## 2012-03-15 MED ORDER — PHENOL 1.4 % MT LIQD: 1.0000 | OROMUCOSAL | Status: DC | PRN

## 2012-03-15 MED ORDER — LACTATED RINGERS IV SOLN
INTRAVENOUS | Status: DC
  Administered 2012-03-15: 12:00:00 via INTRAVENOUS

## 2012-03-15 MED ORDER — METHOCARBAMOL 100 MG/ML IJ SOLN: 500.0000 mg | Freq: Four times a day (QID) | INTRAVENOUS | Status: DC | PRN

## 2012-03-15 MED ORDER — GLYCOPYRROLATE 0.2 MG/ML IJ SOLN
INTRAMUSCULAR | Status: DC | PRN
  Administered 2012-03-15: 0.6 mg via INTRAVENOUS

## 2012-03-15 MED ORDER — KETOROLAC TROMETHAMINE 30 MG/ML IJ SOLN
INTRAMUSCULAR | Status: AC
  Administered 2012-03-15: 30 mg via INTRAVENOUS
  Filled 2012-03-15: qty 1

## 2012-03-15 MED ORDER — LISINOPRIL-HYDROCHLOROTHIAZIDE 20-25 MG PO TABS: 1.0000 | ORAL_TABLET | Freq: Every morning | ORAL | Status: DC

## 2012-03-15 MED ORDER — LACTATED RINGERS IV SOLN
INTRAVENOUS | Status: DC | PRN
  Administered 2012-03-15 (×2): via INTRAVENOUS

## 2012-03-15 MED ORDER — ACETAMINOPHEN 10 MG/ML IV SOLN
1000.0000 mg | Freq: Four times a day (QID) | INTRAVENOUS | Status: AC
  Administered 2012-03-15 – 2012-03-16 (×4): 1000 mg via INTRAVENOUS
  Filled 2012-03-15 (×7): qty 100

## 2012-03-15 MED ORDER — MEPERIDINE HCL 25 MG/ML IJ SOLN: 6.2500 mg | INTRAMUSCULAR | Status: DC | PRN

## 2012-03-15 MED ORDER — PROPOFOL 10 MG/ML IV BOLUS
INTRAVENOUS | Status: DC | PRN
  Administered 2012-03-15: 160 mg via INTRAVENOUS

## 2012-03-15 MED ORDER — FENTANYL CITRATE 0.05 MG/ML IJ SOLN
INTRAMUSCULAR | Status: AC
  Filled 2012-03-15: qty 2

## 2012-03-15 MED ORDER — LORATADINE 10 MG PO TABS
10.0000 mg | ORAL_TABLET | Freq: Every day | ORAL | Status: DC
  Administered 2012-03-16 – 2012-03-17 (×2): 10 mg via ORAL
  Filled 2012-03-15 (×2): qty 1

## 2012-03-15 MED ORDER — EPHEDRINE SULFATE 50 MG/ML IJ SOLN
INTRAMUSCULAR | Status: DC | PRN
  Administered 2012-03-15: 10 mg via INTRAVENOUS
  Administered 2012-03-15 (×4): 5 mg via INTRAVENOUS
  Administered 2012-03-15: 10 mg via INTRAVENOUS

## 2012-03-15 MED ORDER — ONDANSETRON HCL 4 MG/2ML IJ SOLN
INTRAMUSCULAR | Status: DC | PRN
  Administered 2012-03-15: 4 mg via INTRAVENOUS

## 2012-03-15 MED ORDER — FENTANYL CITRATE 0.05 MG/ML IJ SOLN
INTRAMUSCULAR | Status: DC | PRN
  Administered 2012-03-15: 100 ug via INTRAVENOUS
  Administered 2012-03-15 (×2): 50 ug via INTRAVENOUS

## 2012-03-15 MED ORDER — KETOROLAC TROMETHAMINE 30 MG/ML IJ SOLN
15.0000 mg | Freq: Once | INTRAMUSCULAR | Status: AC | PRN
  Administered 2012-03-15: 30 mg via INTRAVENOUS

## 2012-03-15 MED ORDER — METOCLOPRAMIDE HCL 10 MG PO TABS: 5.0000 mg | ORAL_TABLET | Freq: Three times a day (TID) | ORAL | Status: DC | PRN

## 2012-03-15 MED ORDER — HYDROMORPHONE HCL PF 1 MG/ML IJ SOLN: 1.0000 mg | INTRAMUSCULAR | Status: DC | PRN

## 2012-03-15 MED ORDER — KETOROLAC TROMETHAMINE 15 MG/ML IJ SOLN
15.0000 mg | Freq: Four times a day (QID) | INTRAMUSCULAR | Status: AC
  Administered 2012-03-15 – 2012-03-16 (×4): 15 mg via INTRAVENOUS
  Filled 2012-03-15 (×4): qty 1

## 2012-03-15 MED ORDER — METOCLOPRAMIDE HCL 5 MG/ML IJ SOLN: 5.0000 mg | Freq: Three times a day (TID) | INTRAMUSCULAR | Status: DC | PRN

## 2012-03-15 SURGICAL SUPPLY — 58 items
BANDAGE GAUZE ELAST BULKY 4 IN (GAUZE/BANDAGES/DRESSINGS) IMPLANT
BLADE SAW SGTL 18X1.27X75 (BLADE) ×2 IMPLANT
BLADE SURG ROTATE 9660 (MISCELLANEOUS) IMPLANT
BNDG COHESIVE 6X5 TAN STRL LF (GAUZE/BANDAGES/DRESSINGS) IMPLANT
CELLS DAT CNTRL 66122 CELL SVR (MISCELLANEOUS) ×1 IMPLANT
CLOTH BEACON ORANGE TIMEOUT ST (SAFETY) ×2 IMPLANT
COVER BACK TABLE 24X17X13 BIG (DRAPES) IMPLANT
COVER SURGICAL LIGHT HANDLE (MISCELLANEOUS) ×4 IMPLANT
DRAPE C-ARM 42X72 X-RAY (DRAPES) ×2 IMPLANT
DRAPE STERI IOBAN 125X83 (DRAPES) ×2 IMPLANT
DRAPE U-SHAPE 47X51 STRL (DRAPES) ×6 IMPLANT
DRSG AQUACEL AG ADV 3.5X10 (GAUZE/BANDAGES/DRESSINGS) ×4 IMPLANT
DRSG MEPILEX BORDER 4X8 (GAUZE/BANDAGES/DRESSINGS) IMPLANT
DURAPREP 26ML APPLICATOR (WOUND CARE) ×2 IMPLANT
ELECT BLADE 4.0 EZ CLEAN MEGAD (MISCELLANEOUS) ×4
ELECT CAUTERY BLADE 6.4 (BLADE) ×2 IMPLANT
ELECT REM PT RETURN 9FT ADLT (ELECTROSURGICAL) ×2
ELECTRODE BLDE 4.0 EZ CLN MEGD (MISCELLANEOUS) ×2 IMPLANT
ELECTRODE REM PT RTRN 9FT ADLT (ELECTROSURGICAL) ×1 IMPLANT
GAUZE XEROFORM 1X8 LF (GAUZE/BANDAGES/DRESSINGS) ×2 IMPLANT
GLOVE BIO SURGEON STRL SZ8 (GLOVE) ×2 IMPLANT
GLOVE BIOGEL PI IND STRL 7.0 (GLOVE) ×1 IMPLANT
GLOVE BIOGEL PI IND STRL 8 (GLOVE) ×3 IMPLANT
GLOVE BIOGEL PI INDICATOR 7.0 (GLOVE) ×1
GLOVE BIOGEL PI INDICATOR 8 (GLOVE) ×3
GLOVE BIOGEL PI ORTHO PRO 7.5 (GLOVE) ×1
GLOVE ECLIPSE 7.5 STRL STRAW (GLOVE) ×6 IMPLANT
GLOVE ECLIPSE 8.0 STRL XLNG CF (GLOVE) ×2 IMPLANT
GLOVE PI ORTHO PRO STRL 7.5 (GLOVE) ×1 IMPLANT
GLOVE SS BIOGEL STRL SZ 7 (GLOVE) ×1 IMPLANT
GLOVE SS BIOGEL STRL SZ 8 (GLOVE) ×1 IMPLANT
GLOVE SUPERSENSE BIOGEL SZ 7 (GLOVE) ×1
GLOVE SUPERSENSE BIOGEL SZ 8 (GLOVE) ×1
GLOVE SURG SS PI 7.5 STRL IVOR (GLOVE) ×4 IMPLANT
GOWN STRL NON-REIN LRG LVL3 (GOWN DISPOSABLE) ×2 IMPLANT
GOWN STRL REIN XL XLG (GOWN DISPOSABLE) ×10 IMPLANT
HOOD PEEL AWAY FACE SHEILD DIS (HOOD) ×6 IMPLANT
KIT BASIN OR (CUSTOM PROCEDURE TRAY) ×2 IMPLANT
KIT ROOM TURNOVER OR (KITS) ×2 IMPLANT
MANIFOLD NEPTUNE II (INSTRUMENTS) ×2 IMPLANT
NS IRRIG 1000ML POUR BTL (IV SOLUTION) ×4 IMPLANT
PACK TOTAL JOINT (CUSTOM PROCEDURE TRAY) ×2 IMPLANT
PAD ARMBOARD 7.5X6 YLW CONV (MISCELLANEOUS) ×4 IMPLANT
RTRCTR WOUND ALEXIS 18CM MED (MISCELLANEOUS) ×2
SLEEVE SURGEON STRL (DRAPES) ×2 IMPLANT
SPONGE LAP 18X18 X RAY DECT (DISPOSABLE) IMPLANT
STAPLER VISISTAT 35W (STAPLE) IMPLANT
SUT ETHIBOND NAB CT1 #1 30IN (SUTURE) ×6 IMPLANT
SUT VIC AB 0 CT1 27 (SUTURE) ×2
SUT VIC AB 0 CT1 27XBRD ANBCTR (SUTURE) ×2 IMPLANT
SUT VIC AB 1 CT1 27 (SUTURE) ×1
SUT VIC AB 1 CT1 27XBRD ANBCTR (SUTURE) ×1 IMPLANT
SUT VIC AB 2-0 CT1 27 (SUTURE)
SUT VIC AB 2-0 CT1 TAPERPNT 27 (SUTURE) IMPLANT
TOWEL OR 17X24 6PK STRL BLUE (TOWEL DISPOSABLE) ×2 IMPLANT
TOWEL OR 17X26 10 PK STRL BLUE (TOWEL DISPOSABLE) ×2 IMPLANT
TRAY FOLEY CATH 14FR (SET/KITS/TRAYS/PACK) ×2 IMPLANT
WATER STERILE IRR 1000ML POUR (IV SOLUTION) ×4 IMPLANT

## 2012-03-15 NOTE — Brief Op Note (Signed)
03/15/2012  3:23 PM  PATIENT:  Briana Ortiz  62 y.o. female  PRE-OPERATIVE DIAGNOSIS:  OSTEOARTHRITIS, HIP PAIN, left  POST-OPERATIVE DIAGNOSIS:  OSTEOARTHRITIS, HIP PAIN, left  PROCEDURE:  Procedure(s): TOTAL HIP ARTHROPLASTY ANTERIOR APPROACH (Left)  SURGEON:  Surgeon(s) and Role:    * Harvie Junior, MD - Primary    * Velna Ochs, MD - Assisting  PHYSICIAN ASSISTANT:   ASSISTANTSYisroel Ramming MD Julian Hy   ANESTHESIA:   general  EBL:  Total I/O In: 1050 [I.V.:1050] Out: 735 [Urine:350; Blood:385]  BLOOD ADMINISTERED:none  DRAINS: none   LOCAL MEDICATIONS USED:  NONE  SPECIMEN:  No Specimen  DISPOSITION OF SPECIMEN:  N/A  COUNTS:  YES  TOURNIQUET:  * No tourniquets in log *  DICTATION: .Other Dictation: Dictation Number [id number]  PLAN OF CARE: Admit to inpatient   PATIENT DISPOSITION:  PACU - hemodynamically stable.   Delay start of Pharmacological VTE agent (>24hrs) due to surgical blood loss or risk of bleeding: no

## 2012-03-15 NOTE — Anesthesia Procedure Notes (Signed)
Procedure Name: Intubation Date/Time: 03/15/2012 12:40 PM Performed by: Marni Griffon Pre-anesthesia Checklist: Patient identified, Emergency Drugs available, Suction available and Patient being monitored Patient Re-evaluated:Patient Re-evaluated prior to inductionOxygen Delivery Method: Circle system utilized Preoxygenation: Pre-oxygenation with 100% oxygen Intubation Type: IV induction Ventilation: Mask ventilation without difficulty Laryngoscope Size: Mac and 3 Grade View: Grade I Tube type: Oral Tube size: 7.5 mm Number of attempts: 1 Airway Equipment and Method: Stylet Placement Confirmation: ETT inserted through vocal cords under direct vision,  breath sounds checked- equal and bilateral and positive ETCO2 Secured at: 21 (cm at gum) cm Tube secured with: Tape Dental Injury: Teeth and Oropharynx as per pre-operative assessment

## 2012-03-15 NOTE — Anesthesia Preprocedure Evaluation (Addendum)
Anesthesia Evaluation  Patient identified by MRN, date of birth, ID band Patient awake    Reviewed: Allergy & Precautions, H&P , NPO status , Patient's Chart, lab work & pertinent test results, reviewed documented beta blocker date and time   History of Anesthesia Complications Negative for: history of anesthetic complications  Airway Mallampati: II TM Distance: >3 FB Neck ROM: full    Dental no notable dental hx.    Pulmonary neg pulmonary ROS,  breath sounds clear to auscultation  Pulmonary exam normal       Cardiovascular Exercise Tolerance: Good hypertension, Pt. on medications negative cardio ROS  Rhythm:regular Rate:Normal     Neuro/Psych  Neuromuscular disease negative neurological ROS  negative psych ROS   GI/Hepatic negative GI ROS, Neg liver ROS, GERD-  Controlled and Medicated,  Endo/Other  negative endocrine ROSMorbid obesity  Renal/GU negative Renal ROS     Musculoskeletal   Abdominal   Peds  Hematology negative hematology ROS (+)   Anesthesia Other Findings GERD (gastroesophageal reflux disease)     Hypertension        Uterine fibroid     Arthritis    Reproductive/Obstetrics negative OB ROS                          Anesthesia Physical Anesthesia Plan  ASA: III  Anesthesia Plan: General ETT   Post-op Pain Management:    Induction:   Airway Management Planned:   Additional Equipment:   Intra-op Plan:   Post-operative Plan:   Informed Consent: I have reviewed the patients History and Physical, chart, labs and discussed the procedure including the risks, benefits and alternatives for the proposed anesthesia with the patient or authorized representative who has indicated his/her understanding and acceptance.   Dental Advisory Given  Plan Discussed with: CRNA and Surgeon  Anesthesia Plan Comments:         Anesthesia Quick Evaluation

## 2012-03-15 NOTE — H&P (Signed)
TOTAL HIP ADMISSION H&P  Patient is admitted for left total hip arthroplasty.  Subjective:  Chief Complaint: left hip pain  HPI: Briana Ortiz, 62 y.o. female, has a history of pain and functional disability in the left hip(s) due to arthritis and patient has failed non-surgical conservative treatments for greater than 12 weeks to include NSAID's and/or analgesics, flexibility and strengthening excercises, use of assistive devices and activity modification.  Onset of symptoms was abrupt starting 2 years ago with rapidlly worsening course since that time.The patient noted no past surgery on the left hip(s).  Patient currently rates pain in the left hip at 9 out of 10 with activity. Patient has night pain, worsening of pain with activity and weight bearing, trendelenberg gait, pain that interfers with activities of daily living, pain with passive range of motion, crepitus and joint swelling. Patient has evidence of subchondral cysts, subchondral sclerosis, periarticular osteophytes and joint space narrowing by imaging studies. This condition presents safety issues increasing the risk of falls. This patient has had avascular necrosis of the hip, acetabular fracture, hip dysplasia.  There is no current active infection.  Patient Active Problem List   Diagnosis Date Noted  . Preoperative clearance 03/06/2012  . Osteoarthritis of left hip 03/14/2011  . Knee pain, bilateral 01/10/2011  . Meralgia paresthetica 02/05/2008  . HYPERTENSION 12/09/2005  . GERD 12/09/2005   Past Medical History  Diagnosis Date  . GERD (gastroesophageal reflux disease)   . Hypertension   . Uterine fibroid   . Arthritis     Past Surgical History  Procedure Laterality Date  . Hemorrhoid surgery    . Tubal ligation    . Tonsillectomy      Prescriptions prior to admission  Medication Sig Dispense Refill  . famotidine (PEPCID) 40 MG tablet Take 40-80 mg by mouth every morning. Only takes 2 tablets (80 mg) daily if  acid reflux is excessive.      . gabapentin (NEURONTIN) 600 MG tablet Take 1 tablet (600 mg total) by mouth 3 (three) times daily.  90 tablet  3  . HYDROcodone-acetaminophen (NORCO) 7.5-325 MG per tablet Take 1 tablet by mouth every 6 (six) hours as needed for pain.  90 tablet  5  . lisinopril-hydrochlorothiazide (PRINZIDE,ZESTORETIC) 20-25 MG per tablet Take 1 tablet by mouth every morning.      . loratadine (CLARITIN REDITABS) 10 MG dissolvable tablet Take 10 mg by mouth every morning.      . Multiple Vitamin (MULTIVITAMIN WITH MINERALS) TABS Take 1 tablet by mouth daily.      Marland Kitchen HYDROcodone-acetaminophen (LORTAB 7.5) 7.5-500 MG per tablet Take 1 tablet by mouth every 6 (six) hours as needed for pain.  90 tablet  0   No Known Allergies  History  Substance Use Topics  . Smoking status: Never Smoker   . Smokeless tobacco: Never Used  . Alcohol Use: 0.0 oz/week    5-6 Cans of beer per week    Family History  Problem Relation Age of Onset  . Alcohol abuse Mother   . Breast cancer Neg Hx   . Colon cancer Neg Hx   . Ovarian cancer Neg Hx   . Stroke Neg Hx      ROS  Objective:  Physical Exam  Vital signs in last 24 hours: Temp:  [97.9 F (36.6 C)] 97.9 F (36.6 C) (02/14 1049) Pulse Rate:  [56] 56 (02/14 1049) BP: (113)/(70) 113/70 mmHg (02/14 1049)  Labs:   Estimated body mass index is  36.09 kg/(m^2) as calculated from the following:   Height as of 03/08/12: 5\' 1"  (1.549 m).   Weight as of 11/14/11: 86.592 kg (190 lb 14.4 oz).   Imaging Review Plain radiographs demonstrate severe degenerative joint disease of the left hip(s). The bone quality appears to be fair for age and reported activity level.  Assessment/Plan:  End stage arthritis, left hip(s)  The patient history, physical examination, clinical judgement of the provider and imaging studies are consistent with end stage degenerative joint disease of the left hip(s) and total hip arthroplasty is deemed medically  necessary. The treatment options including medical management, injection therapy, arthroscopy and arthroplasty were discussed at length. The risks and benefits of total hip arthroplasty were presented and reviewed. The risks due to aseptic loosening, infection, stiffness, dislocation/subluxation,  thromboembolic complications and other imponderables were discussed.  The patient acknowledged the explanation, agreed to proceed with the plan and consent was signed. Patient is being admitted for inpatient treatment for surgery, pain control, PT, OT, prophylactic antibiotics, VTE prophylaxis, progressive ambulation and ADL's and discharge planning.The patient is planning to be discharged home with home health services

## 2012-03-15 NOTE — Transfer of Care (Signed)
Immediate Anesthesia Transfer of Care Note  Patient: Briana Ortiz  Procedure(s) Performed: Procedure(s): TOTAL HIP ARTHROPLASTY ANTERIOR APPROACH (Left)  Patient Location: PACU  Anesthesia Type:General  Level of Consciousness: awake, alert , oriented and patient cooperative  Airway & Oxygen Therapy: Patient Spontanous Breathing and Patient connected to nasal cannula oxygen  Post-op Assessment: Report given to PACU RN, Post -op Vital signs reviewed and stable and Patient moving all extremities  Post vital signs: Reviewed and stable  Complications: No apparent anesthesia complications

## 2012-03-15 NOTE — Preoperative (Signed)
Beta Blockers   Reason not to administer Beta Blockers:Not Applicable 

## 2012-03-15 NOTE — Anesthesia Postprocedure Evaluation (Signed)
Anesthesia Post Note  Patient: Briana Ortiz  Procedure(s) Performed: Procedure(s) (LRB): TOTAL HIP ARTHROPLASTY ANTERIOR APPROACH (Left)  Anesthesia type: general  Patient location: PACU  Post pain: Pain level controlled  Post assessment: Patient's Cardiovascular Status Stable  Last Vitals:  Filed Vitals:   03/15/12 1630  BP: 95/63  Pulse: 60  Temp:   Resp: 17    Post vital signs: Reviewed and stable  Level of consciousness: sedated  Complications: No apparent anesthesia complications

## 2012-03-16 LAB — CBC
Hemoglobin: 10 g/dL — ABNORMAL LOW (ref 12.0–15.0)
MCH: 31.3 pg (ref 26.0–34.0)
Platelets: 154 10*3/uL (ref 150–400)
RBC: 3.19 MIL/uL — ABNORMAL LOW (ref 3.87–5.11)
WBC: 6.1 10*3/uL (ref 4.0–10.5)

## 2012-03-16 LAB — BASIC METABOLIC PANEL
CO2: 28 mEq/L (ref 19–32)
Calcium: 8.8 mg/dL (ref 8.4–10.5)
Chloride: 101 mEq/L (ref 96–112)
Glucose, Bld: 92 mg/dL (ref 70–99)
Sodium: 138 mEq/L (ref 135–145)

## 2012-03-16 MED ORDER — HYDROCODONE-ACETAMINOPHEN 7.5-325 MG/15ML PO SOLN
10.0000 mL | ORAL | Status: DC | PRN
  Administered 2012-03-16 – 2012-03-17 (×2): 15 mL via ORAL
  Filled 2012-03-16: qty 30
  Filled 2012-03-16 (×2): qty 15

## 2012-03-16 NOTE — Progress Notes (Signed)
Physical Therapy Treatment Patient Details Name: Briana Ortiz MRN: [id number] DOB: [birth year] Today's Date: 03/16/2012 Time: 5784-6962 PT Time Calculation (min): 36 min  PT Assessment / Plan / Recommendation Comments on Treatment Session  Mobility progressing well. On pace from a mobility standpoint to DC tomorrow.     Follow Up Recommendations  Home health PT     Does the patient have the potential to tolerate intense rehabilitation     Barriers to Discharge        Equipment Recommendations  Rolling walker with 5" wheels    Recommendations for Other Services    Frequency 7X/week   Plan Discharge plan remains appropriate    Precautions / Restrictions Precautions Precautions: None Restrictions Weight Bearing Restrictions: Yes LLE Weight Bearing: Weight bearing as tolerated   Pertinent Vitals/Pain 7/10 in L hip.  Had meds just prior to treatment session per pt    Mobility  Bed Mobility Bed Mobility: Sit to Supine Sit to Supine: 4: Min assist;HOB flat Details for Bed Mobility Assistance: assist with LLE Transfers Transfers: Sit to Stand;Stand to Sit Sit to Stand: 4: Min assist;With upper extremity assist;From chair/3-in-1 Stand to Sit: 4: Min guard;To bed;To chair/3-in-1;With armrests Details for Transfer Assistance: cues for safest technique, pt used 3-N-1 over commode during session i Ambulation/Gait Ambulation/Gait Assistance: 4: Min guard Ambulation Distance (Feet): 70 Feet (Also ambulated 10 feet to bathroom) Assistive device: Rolling walker Ambulation/Gait Assistance Details: much more fluid gait pattern this pm Gait Pattern: Step-to pattern Gait velocity: decreased    Exercises Total Joint Exercises Ankle Circles/Pumps: AROM;Both;Supine;10 reps Short Arc Quad: AROM;10 reps;Left;Supine Heel Slides: AAROM;Left;10 reps;Supine Hip ABduction/ADduction: Left;AAROM;10 reps;Supine   PT Diagnosis:    PT Problem List:   PT Treatment Interventions:     PT  Goals Acute Rehab PT Goals PT Goal Formulation: With patient Time For Goal Achievement: 03/19/12 Potential to Achieve Goals: Good Pt will go Supine/Side to Sit: with modified independence PT Goal: Supine/Side to Sit - Progress: Progressing toward goal Pt will go Sit to Stand: with supervision PT Goal: Sit to Stand - Progress: Progressing toward goal Pt will Ambulate: 51 - 150 feet;with supervision;with rolling walker PT Goal: Ambulate - Progress: Progressing toward goal Pt will Go Up / Down Stairs: 3-5 stairs;with min assist;with rail(s) PT Goal: Up/Down Stairs - Progress: Goal set today Pt will Perform Home Exercise Program: with supervision, verbal cues required/provided PT Goal: Perform Home Exercise Program - Progress: Progressing toward goal  Visit Information  Last PT Received On: 03/16/12 Assistance Needed: +1    Subjective Data      Cognition  Cognition Overall Cognitive Status: Appears within functional limits for tasks assessed/performed Arousal/Alertness: Awake/alert Orientation Level: Appears intact for tasks assessed Behavior During Session: [facility] for tasks performed    Balance     End of Session PT - End of Session Equipment Utilized During Treatment: Gait belt Activity Tolerance: Patient tolerated treatment well Patient left: in bed;with call bell/phone within reach Nurse Communication: Mobility status   GP     Donnella Sham 03/16/2012, 2:00 PM Lavona Mound, PT  [phone] 03/16/2012

## 2012-03-16 NOTE — Op Note (Signed)
NAMEKATHRYNNE, Briana Ortiz NO.:  [id number]  MEDICAL RECORD NO.:  [id number]  LOCATION:  [id number]                        FACILITY:  [facility]  PHYSICIAN:  Harvie Junior, M.D.   DATE OF BIRTH:  1950-02-07  DATE OF PROCEDURE:  03/15/2012 DATE OF DISCHARGE:                              OPERATIVE REPORT   PREOPERATIVE DIAGNOSIS:  Severe end-stage degenerative joint disease, left hip.  POSTOPERATIVE DIAGNOSIS:  Severe end-stage degenerative joint disease, left hip.  PROCEDURE:  Left total hip replacement with a Cary #10 stem and a 48 mm sector grip shin cup with a +4 neutral liner and a 32-mm ceramic ball.  SURGEON:  Harvie Junior, M.D.  ASSISTANT:  Lubertha Basque. Jerl Santos, M.D. and Marshia Ly, P.A.  ANESTHESIA:  General.  BRIEF HISTORY:  Briana Ortiz is a 62 year old female with a long history of having had significant complaints of left hip pain.  I had seen her in the office many months ago and at that point diagnosed her with severe degenerative disease with collapse.  She after a long discussion of treatment options, attempted conservative care but was unsuccessful. After failure of all conservative care, she was taken to the operating room for left total hip replacement.  DESCRIPTION OF PROCEDURE:  The patient was taken to operating room. After adequate anesthesia was obtained with general anesthetic, the patient was placed supine on the operating table on the Hana bed.  Her feet had been placed into boots care being taken to make sure that the heels were all the way down.  She was placed on the Hana bed, locked in and the table was zeroed out.  At that point, she was moved down against the post and x-ray was brought and got good positioning of the pelvis and assessed for leg lengths in the acetabulum.  Once this was done, she was prepped and draped in the usual sterile fashion.  Following this, attention was turned to the left hip where a 9 cm incision was made  from just posterior to the anterior superior iliac spine as spiraling down towards the trochanter.  Subcutaneous tissue was dissected down the level of tensor fascia, which was identified and a tissue retractor was put in place.  The tensor fascia was opened and the tensor muscle was pulled posteriorly and the retractors were put in place in the superior neck.  Once that was accomplished, retractors were placed in the inferior neck and the lateral circumflex vessels were identified and coagulated.  Attention was then turned towards the opening of the capsule and the capsule was opened where tag stitch was placed anteriorly and posteriorly.  Once these tag stitches were placed, the capsule was retracted.  The retractors were then placed back inside the capsule and fluoro was brought in and a provisional neck cut was made, and the head was removed.  Once this was accomplished, retractors were put in anterior and posterior to the acetabulum.  The acetabulum was sequentially reamed up to a level of 47.  At that point, a 48-mm sector cup was used and placed in 45 degrees of lateral opening and 20 degrees of anteversion under fluoroscopic imaging.  Once  this was done, attention was turned towards of the hole eliminator which was placed and a +4 neutral liner was placed into the shell at this point.  Attention was then turned towards the femur, which was actually rotated to 90 degrees and at that point, we felt that we had good ability to get into the femur.  The Hana table was then used and the lavaging bar was placed posterior to the femur and the femur was then brought down and over position and with 90 degrees of external rotation.  The lateral neck was then removed.  The posterior capsule was taken around to the back and the piriformis was identified and taken down, and once this was done, this allowed the femur to be delivered into the wound.  Cookie cutter was used at this point followed  by canal finder followed by the chili pepper, and the canal was identified and was impaction broached up to a level of 10 and then a calcar planer was used.  At this point, a trial reduction was undertaken with a standard neck and a +0 ball, and this gave a fairly symmetric leg lengths.  At that point, I thought we might be a little bit generous in her leg length, so we went ahead and impacted the broach slightly farther and then planed down the neck and then once this was done, the hip was dislocated.  Retractors were put back in place.  The Cary stem #10 was hammered into place, re-trialed with a +0 ball.  Excellent stability and range of motion was achieved at this point, and at that point, we felt that we were stable.  The trial ball was removed.  The final ceramic ball was opened and placed, and the hip was then reduced.  The anterior capsule was repaired with the stitches that we had originally placed.  This gave excellent repair of the anterior capsule and once this was completed, attention was turned towards closure.  The tensor fascia was allowed to fall back into place, and the fascia overlying the tensor was closed with 1 Vicryl running, the skin with 0 and 2-0 Vicryl and skin staples.  Sterile compressive dressing was applied as well as an Aquacel dressing and the patient was taken to recovery where she was noted to be in satisfactory condition. Estimated blood loss for the procedure was 350 mL.     Harvie Junior, M.D.     Ranae Plumber  D:  03/15/2012  T:  03/16/2012  Job:  [id number]

## 2012-03-16 NOTE — Progress Notes (Signed)
Patient ID: Briana Ortiz, female   DOB: 1951-01-16, 62 y.o.   MRN: [id number] Pt has been up and doing well.  Pain well controlled.  Pt has good quad function.  Pt may go home tomorrow. Will follow up in office in 2 weeks. Latricia Cerrito

## 2012-03-16 NOTE — Progress Notes (Signed)
Physical Therapy Evaluation Patient Details Name: Briana Ortiz MRN: [id number] DOB: [birth year] Today's Date: 03/16/2012 Time: 5784-6962 PT Time Calculation (min): 20 min  PT Assessment / Plan / Recommendation Clinical Impression  62 yo F s/p direct anterior THA.  Presents to PT with dependencies in [place] and will benefit from skilled PT.  Anticipate pt will progress quickly with mobiility.Marland Kitchen  Pt plans to DC to daughter's house for a few days before returning home.      PT Assessment  Patient needs continued PT services    Follow Up Recommendations  Home health PT    Does the patient have the potential to tolerate intense rehabilitation      Barriers to Discharge        Equipment Recommendations  Rolling walker with 5" wheels    Recommendations for Other Services     Frequency 7X/week    Precautions / Restrictions Precautions Precautions: None Restrictions Weight Bearing Restrictions: Yes LLE Weight Bearing: Weight bearing as tolerated   Pertinent Vitals/Pain 5/10 L hip pain      Mobility  Bed Mobility Bed Mobility: Supine to Sit Supine to Sit: 4: Min assist;HOB elevated Details for Bed Mobility Assistance: assist with LLE Transfers Transfers: Sit to Stand;Stand to Sit Sit to Stand: 4: Min assist;From bed;With upper extremity assist Stand to Sit: 4: Min assist;With armrests;To chair/3-in-1 Details for Transfer Assistance: cues for safest technique Ambulation/Gait Ambulation/Gait Assistance: 4: Min assist Ambulation Distance (Feet): 30 Feet Assistive device: Rolling walker Ambulation/Gait Assistance Details: cues for sequencing Gait Pattern: Step-to pattern Gait velocity: decreased    Exercises Total Joint Exercises Ankle Circles/Pumps: AROM;Both;5 reps;Supine   PT Diagnosis: Difficulty walking  PT Problem List: Decreased strength;Decreased mobility;Decreased knowledge of use of DME;Pain PT Treatment Interventions: DME instruction;Gait training;Stair  training;Therapeutic exercise;Patient/family education   PT Goals Acute Rehab PT Goals PT Goal Formulation: With patient Time For Goal Achievement: 03/19/12 Potential to Achieve Goals: Good Pt will go Supine/Side to Sit: with modified independence PT Goal: Supine/Side to Sit - Progress: Goal set today Pt will go Sit to Stand: with supervision PT Goal: Sit to Stand - Progress: Goal set today Pt will Ambulate: 51 - 150 feet;with supervision;with rolling walker PT Goal: Ambulate - Progress: Goal set today Pt will Go Up / Down Stairs: 3-5 stairs;with min assist;with rail(s) PT Goal: Up/Down Stairs - Progress: Goal set today Pt will Perform Home Exercise Program: with supervision, verbal cues required/provided PT Goal: Perform Home Exercise Program - Progress: Goal set today  Visit Information  Last PT Received On: 03/16/12 Assistance Needed: +1    Subjective Data  Patient Stated Goal: to walk better   Prior Functioning  Home Living Lives With: Daughter (plan is to DC to daughters house for  before home ) Available Help at Discharge: Family Type of Home: House Home Access: Stairs to enter [employer] of Steps:  (several, pt not sure how many) Home Layout: One level Home Adaptive Equipment: Straight cane Additional Comments: pt lives in apt on 11th floor with elevator.  No stairs to enter Prior Function Level of Independence: Independent with assistive device(s) (cane secondary to hip pain) Communication Communication: No difficulties    Cognition  Cognition Overall Cognitive Status: Appears within functional limits for tasks assessed/performed Arousal/Alertness: Awake/alert Orientation Level: Appears intact for tasks assessed Behavior During Session: [facility] for tasks performed    Extremity/Trunk Assessment Right Lower Extremity Assessment RLE ROM/Strength/Tone: Within functional levels Left Lower Extremity Assessment LLE ROM/Strength/Tone: Deficits LLE  ROM/Strength/Tone Deficits:  decreased strength secondary to pain/recent surgery   Balance    End of Session PT - End of Session Equipment Utilized During Treatment: Gait belt Activity Tolerance: Patient tolerated treatment well Patient left: in chair;with call bell/phone within reach Nurse Communication: Mobility status  GP     Donnella Sham 03/16/2012, 11:05 AM Lavona Mound, PT  [phone] 03/16/2012

## 2012-03-16 NOTE — Progress Notes (Signed)
Subjective: 1 Day Post-Op Procedure(s) (LRB): TOTAL HIP ARTHROPLASTY ANTERIOR APPROACH (Left)  Activity level:  Is up with pt now Diet tolerance:  Ok  Voiding:   Foley to be removed Patient reports pain as 3 on 0-10 scale.    Objective: Vital signs in last 24 hours: Temp:  [96.9 F (36.1 C)-98.5 F (36.9 C)] 97.5 F (36.4 C) (02/15 1610) Pulse Rate:  [50-72] 54 (02/15 0633) Resp:  [11-18] 18 (02/15 0633) BP: (81-115)/(55-89) 115/76 mmHg (02/15 0633) SpO2:  [99 %-100 %] 100 % (02/15 0633)  Labs:  Recent Labs  03/16/12 0630  HGB 10.0*    Recent Labs  03/16/12 0630  WBC 6.1  RBC 3.19*  HCT 30.1*  PLT 154    Recent Labs  03/16/12 0630  NA 138  K 3.6  CL 101  CO2 28  BUN 11  CREATININE 0.81  GLUCOSE 92  CALCIUM 8.8   No results found for this basename: LABPT, INR,  in the last 72 hours  Physical Exam:  Neurologically intact ABD soft Neurovascular intact Sensation intact distally Intact pulses distally Dorsiflexion/Plantar flexion intact Incision: dressing C/D/I No cellulitis present Compartment soft  Assessment/Plan:  1 Day Post-Op Procedure(s) (LRB): TOTAL HIP ARTHROPLASTY ANTERIOR APPROACH (Left) Advance diet Up with therapy D/C IV fluids Plan for discharge tomorrow if all goes ok with pt Hl  Iv  d/c foley Asa 325 bid to prevent dvt    Briana Ortiz R 03/16/2012, 8:46 AM

## 2012-03-17 LAB — CBC
HCT: 28 % — ABNORMAL LOW (ref 36.0–46.0)
Hemoglobin: 9.5 g/dL — ABNORMAL LOW (ref 12.0–15.0)
MCH: 31.8 pg (ref 26.0–34.0)
MCV: 93.6 fL (ref 78.0–100.0)
RBC: 2.99 MIL/uL — ABNORMAL LOW (ref 3.87–5.11)
WBC: 5.8 10*3/uL (ref 4.0–10.5)

## 2012-03-17 MED ORDER — HYDROCODONE-ACETAMINOPHEN 7.5-325 MG PO TABS: 1.0000 | ORAL_TABLET | Freq: Four times a day (QID) | ORAL | Status: DC | PRN

## 2012-03-17 MED ORDER — ASPIRIN 325 MG PO TBEC: 325.0000 mg | DELAYED_RELEASE_TABLET | Freq: Two times a day (BID) | ORAL | Status: DC

## 2012-03-17 NOTE — Progress Notes (Signed)
Physical Therapy Treatment Patient Details Name: Briana Ortiz MRN: [id number] DOB: August 18, 1950 Today's Date: 03/17/2012 Time: 1478-2956 PT Time Calculation (min): 27 min  PT Assessment / Plan / Recommendation Comments on Treatment Session  Pt making steady progress with mobility.  Initiated stair training this session-- spoke with pt's daughter, whom she will be staying with, & daughter reports there are 10-12 stairs to get to bedroom.  Pt reports she can sleep downstairs on couch if needed.      Follow Up Recommendations  Home health PT     Does the patient have the potential to tolerate intense rehabilitation     Barriers to Discharge        Equipment Recommendations  Rolling walker with 5" wheels    Recommendations for Other Services    Frequency 7X/week   Plan Discharge plan remains appropriate    Precautions / Restrictions Precautions Precautions: None Restrictions Weight Bearing Restrictions: Yes LLE Weight Bearing: Weight bearing as tolerated       Mobility  Bed Mobility Bed Mobility: Not assessed Transfers Transfers: Sit to Stand;Stand to Sit Sit to Stand: 4: Min guard;With armrests;From chair/3-in-1;With upper extremity assist Stand to Sit: 4: Min guard;With upper extremity assist;With armrests;To chair/3-in-1 Details for Transfer Assistance: Cues for safest hand placement & to use UE's to control descent.   Ambulation/Gait Ambulation/Gait Assistance: 4: Min guard Ambulation Distance (Feet): 100 Feet Assistive device: Rolling walker Ambulation/Gait Assistance Details: Encouragement to decrease reliance of UE's on RW Gait Pattern: Step-to pattern;Decreased hip/knee flexion - left;Antalgic Gait velocity: decreased Stairs: Yes Stairs Assistance: 4: Min guard Stairs Assistance Details (indicate cue type and reason): Pt used Bil rails to mimic 1 rail + wall at daughters house.  Cues for sequencing & technique.  Pt hip hikes Lt LE to ascend.   Stair Management  Technique: Two rails;Step to pattern;Forwards Number of Stairs: 4    Exercises Total Joint Exercises Ankle Circles/Pumps: AROM;Both;10 reps Heel Slides: AAROM;Left;10 reps     PT Goals Acute Rehab PT Goals Time For Goal Achievement: 03/19/12 Potential to Achieve Goals: Good Pt will go Supine/Side to Sit: with modified independence Pt will go Sit to Stand: with supervision PT Goal: Sit to Stand - Progress: Progressing toward goal Pt will Ambulate: 51 - 150 feet;with supervision;with rolling walker PT Goal: Ambulate - Progress: Progressing toward goal Pt will Go Up / Down Stairs: 3-5 stairs;with min assist;with rail(s) PT Goal: Up/Down Stairs - Progress: Progressing toward goal Pt will Perform Home Exercise Program: with supervision, verbal cues required/provided PT Goal: Perform Home Exercise Program - Progress: Progressing toward goal  Visit Information  Last PT Received On: 03/17/12 Assistance Needed: +1    Subjective Data      Cognition  Cognition Overall Cognitive Status: Appears within functional limits for tasks assessed/performed Arousal/Alertness: Awake/alert Orientation Level: Appears intact for tasks assessed Behavior During Session: [facility] for tasks performed    Balance     End of Session PT - End of Session Equipment Utilized During Treatment: Gait belt Activity Tolerance: Patient tolerated treatment well Patient left: in chair;with call bell/phone within reach Nurse Communication: Mobility status     Verdell Face, [place] [phone] 03/17/2012

## 2012-03-17 NOTE — Progress Notes (Signed)
Subjective: 2 Days Post-Op Procedure(s) (LRB): TOTAL HIP ARTHROPLASTY ANTERIOR APPROACH (Left)  Activity level:  wbat Diet tolerance:  good Voiding:  ok Patient reports pain as 2 on 0-10 scale.    Objective: Vital signs in last 24 hours: Temp:  [98.1 F (36.7 C)-99.6 F (37.6 C)] 99.6 F (37.6 C) (02/16 0602) Pulse Rate:  [54-84] 70 (02/16 0602) Resp:  [18] 18 (02/16 0602) BP: (92-110)/(41-66) 92/41 mmHg (02/16 0602) SpO2:  [98 %-99 %] 98 % (02/16 0602)  Labs:  Recent Labs  03/16/12 0630 03/17/12 0450  HGB 10.0* 9.5*    Recent Labs  03/16/12 0630 03/17/12 0450  WBC 6.1 5.8  RBC 3.19* 2.99*  HCT 30.1* 28.0*  PLT 154 152    Recent Labs  03/16/12 0630  NA 138  K 3.6  CL 101  CO2 28  BUN 11  CREATININE 0.81  GLUCOSE 92  CALCIUM 8.8   No results found for this basename: LABPT, INR,  in the last 72 hours  Physical Exam:  Neurologically intact ABD soft Neurovascular intact Sensation intact distally Intact pulses distally Dorsiflexion/Plantar flexion intact Incision: dressing C/D/I No cellulitis present Compartment soft  Assessment/Plan:  2 Days Post-Op Procedure(s) (LRB): TOTAL HIP ARTHROPLASTY ANTERIOR APPROACH (Left) Advance diet Up with therapy D/C IV fluids Discharge home with home health ASA 325 1 po bid x 4 weeks Home therapy    Briana Ortiz R 03/17/2012, 11:46 AM

## 2012-03-17 NOTE — Progress Notes (Signed)
Discharge Note: Pt is alert and oriented, VS are stable, denies CP.  IV discontinued.  Discharge instructions reviewed with patient, pt verbalizes understanding.  Wheelchair transportation provided, all belongings with patient  

## 2012-03-17 NOTE — Progress Notes (Signed)
CARE MANAGEMENT NOTE 03/17/2012  Patient:  Briana Ortiz, Briana Ortiz   Account Number:  [id number]  Date Initiated:  03/17/2012  Documentation initiated by:  Vance Peper  Subjective/Objective Assessment:   62 yr old female s/p left total hip arthroplasty.     Action/Plan:   CM spoke with patient concerning home health and DME needs. Patient will stay at daughter's home to recover.  [address], [place] [address]  [phone]   Anticipated DC Date:  03/18/2012   Anticipated DC Plan:  HOME W HOME HEALTH SERVICES      DC Planning Services  CM consult      [facility] Choice  HOME HEALTH  DURABLE MEDICAL EQUIPMENT   Choice offered to / List presented to:  C-1 Patient   DME arranged  3-N-1  Levan Hurst      DME agency  [facility]     HH arranged  HH-2 PT      [facility] agency  [facility]   Status of service:  Completed, signed off Medicare Important Message given?   (If response is "NO", the following Medicare IM given date fields will be blank) Date Medicare IM given:   Date Additional Medicare IM given:    Discharge Disposition:  HOME W HOME HEALTH SERVICES  Per UR Regulation:    If discussed at Long Length of Stay Meetings, dates discussed:    Comments:

## 2012-03-17 NOTE — Discharge Summary (Signed)
Patient ID: Briana Ortiz MRN: [id number] DOB/AGE: March 23, 1950 62 y.o.  Admit date: 03/15/2012 Discharge date: 03/17/2012  Admission Diagnoses:  Principal Problem:   Osteoarthritis of left hip   Discharge Diagnoses:  Same  Past Medical History  Diagnosis Date  . GERD (gastroesophageal reflux disease)   . Hypertension   . Uterine fibroid   . Arthritis     Surgeries: Procedure(s): TOTAL HIP ARTHROPLASTY ANTERIOR APPROACH on 03/15/2012   Consultants:    Discharged Condition: Improved  Hospital Course: Briana Ortiz is an 62 y.o. female who was admitted 03/15/2012 for operative treatment ofOsteoarthritis of left hip. Patient has severe unremitting pain that affects sleep, daily activities, and work/hobbies. After pre-op clearance the patient was taken to the operating room on 03/15/2012 and underwent  Procedure(s): TOTAL HIP ARTHROPLASTY ANTERIOR APPROACH.    Patient was given perioperative antibiotics: Anti-infectives   Start     Dose/Rate Route Frequency Ordered Stop   03/15/12 1800  ceFAZolin (ANCEF) IVPB 2 g/50 mL premix     2 g 100 mL/hr over 30 Minutes Intravenous Every 6 hours 03/15/12 1714 03/16/12 0030   03/15/12 0600  ceFAZolin (ANCEF) IVPB 2 g/50 mL premix     2 g 100 mL/hr over 30 Minutes Intravenous On call to O.R. 03/14/12 1218 03/15/12 1242       Patient was given sequential compression devices, early ambulation, and chemoprophylaxis to prevent DVT.  Patient benefited maximally from hospital stay and there were no complications.    Recent vital signs: Patient Vitals for the past 24 hrs:  BP Temp Temp src Pulse Resp SpO2  03/17/12 0602 92/41 mmHg 99.6 F (37.6 C) - 70 18 98 %  03/16/12 2120 110/66 mmHg 98.9 F (37.2 C) - 84 18 99 %  03/16/12 1336 97/54 mmHg 98.1 F (36.7 C) Oral 54 18 98 %     Recent laboratory studies:  Recent Labs  03/16/12 0630 03/17/12 0450  WBC 6.1 5.8  HGB 10.0* 9.5*  HCT 30.1* 28.0*  PLT 154 152  NA 138  --   K 3.6  --    CL 101  --   CO2 28  --   BUN 11  --   CREATININE 0.81  --   GLUCOSE 92  --   CALCIUM 8.8  --      Discharge Medications:     Medication List    STOP taking these medications       HYDROcodone-acetaminophen 7.5-500 MG per tablet  Commonly known as:  LORTAB 7.5      TAKE these medications       aspirin 325 MG EC tablet  Take 1 tablet (325 mg total) by mouth 2 (two) times daily after a meal.     famotidine 40 MG tablet  Commonly known as:  PEPCID  Take 40-80 mg by mouth every morning. Only takes 2 tablets (80 mg) daily if acid reflux is excessive.     gabapentin 600 MG tablet  Commonly known as:  NEURONTIN  Take 1 tablet (600 mg total) by mouth 3 (three) times daily.     HYDROcodone-acetaminophen 7.5-325 MG per tablet  Commonly known as:  NORCO  Take 1 tablet by mouth every 6 (six) hours as needed for pain.     lisinopril-hydrochlorothiazide 20-25 MG per tablet  Commonly known as:  PRINZIDE,ZESTORETIC  Take 1 tablet by mouth every morning.     loratadine 10 MG dissolvable tablet  Commonly known as:  CLARITIN REDITABS  Take 10 mg by mouth every morning.     methocarbamol 750 MG tablet  Commonly known as:  ROBAXIN-750  One every 8 hrs prn spasm.     multivitamin with minerals Tabs  Take 1 tablet by mouth daily.     oxyCODONE-acetaminophen 5-325 MG per tablet  Commonly known as:  PERCOCET/ROXICET  Take 1-2 tablets by mouth every 6 (six) hours as needed for pain.        Diagnostic Studies: Dg Chest 2 View  03/08/2012  *RADIOLOGY REPORT*  Clinical Data: 62 year old female preoperative study for hip surgery.  Hypertension.  CHEST - 2 VIEW  Comparison: None.  Findings: Normal to mildly low lung volumes.  Mild elevation of the right hemidiaphragm.  Mild tortuosity of the descending thoracic aorta. Other mediastinal contours are within normal limits. Visualized tracheal air column is within normal limits.  The lungs are clear.  No pneumothorax or effusion. No acute  osseous abnormality identified.  IMPRESSION: No acute cardiopulmonary abnormality.   Original Report Authenticated By: Erskine Speed, M.D.    Dg Hip Operative Left  03/15/2012  *RADIOLOGY REPORT*  Clinical Data: Left hip arthroplasty  OPERATIVE LEFT HIP  Comparison: Preoperative radiographs 01/10/2011  Findings: Intraoperative spot radiographs depict interval left hip arthroplasty without evidence of immediate hardware complication. The femoral head prosthesis appears located with the acetabular prosthesis on the single frontal view.  The visualized bony structures are intact.  IMPRESSION: Left hip arthroplasty without evidence of immediate complication as above.   Original Report Authenticated By: Malachy Moan, M.D.    Dg Pelvis Portable  03/15/2012  *RADIOLOGY REPORT*  Clinical Data: Postop.  PORTABLE PELVIS  Comparison: 01/10/2011.  Findings: The patient has undergone interval left total hip arthroplasty as the prosthetic components are intact and normally located.  Skin staples present over the lateral soft tissues. There are mild degenerate changes of the right hip.  No change in a large calcified uterine fibroid.  Remainder of the exam is unchanged.  IMPRESSION: Post left total hip arthroplasty.  Minimal degenerate change of the right hip.   Original Report Authenticated By: Elberta Fortis, M.D.    Dg Hip Portable 1 View Left  03/15/2012  *RADIOLOGY REPORT*  Clinical Data: Postop hip replacement  PORTABLE LEFT HIP - 1 VIEW  Comparison: Intraoperative C-arm spot films of 03/15/2038  Findings: A cross-table lateral portable views shows the femoral stem of the left hip replacement in good position with no complicating features.  IMPRESSION: Femoral component in good position with no acute abnormality.   Original Report Authenticated By: Dwyane Dee, M.D.     Disposition: Final discharge disposition not confirmed      Discharge Orders   Future Orders Complete By Expires     Call MD / Call 911  As  directed     Comments:      If you experience chest pain or shortness of breath, CALL 911 and be transported to the hospital emergency room.  If you develope a fever above 101 F, pus (white drainage) or increased drainage or redness at the wound, or calf pain, call your surgeon's office.    Constipation Prevention  As directed     Comments:      Drink plenty of fluids.  Prune juice may be helpful.  You may use a stool softener, such as Colace (over the counter) 100 mg twice a day.  Use MiraLax (over the counter) for constipation as needed.    Diet - low sodium heart  healthy  As directed     Increase activity slowly as tolerated  As directed        Follow-up Information   Follow up with GRAVES,JOHN L, MD. Schedule an appointment as soon as possible for a visit in 2 weeks.   Contact information:   [address] [place] [address] [phone]        Signed: Prince Rome 03/17/2012, 11:52 AM

## 2012-03-18 ENCOUNTER — Encounter (HOSPITAL_COMMUNITY): Payer: Self-pay | Admitting: Orthopedic Surgery

## 2012-03-18 NOTE — Progress Notes (Signed)
Utilization review completed.  P.J. Raylon Lamson,RN,BSN Case Manager [phone]  

## 2012-04-15 ENCOUNTER — Other Ambulatory Visit: Payer: Self-pay | Admitting: Internal Medicine

## 2012-04-15 DIAGNOSIS — Z1231 Encounter for screening mammogram for malignant neoplasm of breast: Secondary | ICD-10-CM

## 2012-05-03 ENCOUNTER — Other Ambulatory Visit: Payer: Self-pay | Admitting: *Deleted

## 2012-05-03 MED ORDER — GABAPENTIN 600 MG PO TABS: 600.0000 mg | ORAL_TABLET | Freq: Three times a day (TID) | ORAL | Status: DC

## 2012-05-03 MED ORDER — DICLOFENAC SODIUM 1 % TD GEL: 1.0000 "application " | Freq: Four times a day (QID) | TRANSDERMAL | Status: DC

## 2012-05-06 ENCOUNTER — Encounter: Payer: Self-pay | Admitting: Internal Medicine

## 2012-05-07 ENCOUNTER — Telehealth: Payer: Self-pay | Admitting: *Deleted

## 2012-05-07 NOTE — Telephone Encounter (Signed)
Pharmacy called and they need to know How many grams of Voltaren 1% is the patient supposed to be applying?

## 2012-05-10 NOTE — Telephone Encounter (Signed)
I have no idea.  Send to PCP or whoever prescribed this.

## 2012-05-13 ENCOUNTER — Other Ambulatory Visit: Payer: Self-pay | Admitting: Internal Medicine

## 2012-05-13 ENCOUNTER — Telehealth: Payer: Self-pay | Admitting: *Deleted

## 2012-05-13 MED ORDER — DICLOFENAC SODIUM 1 % TD GEL: 4.0000 g | Freq: Four times a day (QID) | TRANSDERMAL | Status: DC

## 2012-05-13 NOTE — Telephone Encounter (Signed)
Pharmacy needs to know how many grams is pt to use for application. [facility]. Stanton Kidney Lanie Schelling RN 05/13/12 2:45PM

## 2012-05-15 ENCOUNTER — Telehealth: Payer: Self-pay | Admitting: *Deleted

## 2012-05-15 NOTE — Telephone Encounter (Signed)
Waiting for pt to rtc for pharmacy choice

## 2012-05-20 ENCOUNTER — Ambulatory Visit (HOSPITAL_COMMUNITY)
Admission: RE | Admit: 2012-05-20 | Discharge: 2012-05-20 | Disposition: A | Discharge status: Home / Self Care | Payer: PRIVATE HEALTH INSURANCE | Source: Ambulatory Visit | Attending: Family Medicine | Admitting: Family Medicine

## 2012-05-20 DIAGNOSIS — Z1231 Encounter for screening mammogram for malignant neoplasm of breast: Secondary | ICD-10-CM

## 2012-05-20 NOTE — Telephone Encounter (Signed)
Apply 2g QID

## 2012-05-21 NOTE — Telephone Encounter (Signed)
Called pharmacy - new Rx was sent in.

## 2012-07-02 ENCOUNTER — Encounter: Payer: PRIVATE HEALTH INSURANCE | Admitting: Internal Medicine

## 2012-08-15 ENCOUNTER — Encounter: Payer: Self-pay | Admitting: Internal Medicine

## 2012-08-15 ENCOUNTER — Ambulatory Visit (INDEPENDENT_AMBULATORY_CARE_PROVIDER_SITE_OTHER): Payer: Medicaid Other | Admitting: Internal Medicine

## 2012-08-15 VITALS — BP 102/68 | HR 67 | Temp 97.7°F | Ht 63.0 in | Wt 190.3 lb

## 2012-08-15 DIAGNOSIS — K219 Gastro-esophageal reflux disease without esophagitis: Secondary | ICD-10-CM

## 2012-08-15 DIAGNOSIS — I10 Essential (primary) hypertension: Secondary | ICD-10-CM

## 2012-08-15 DIAGNOSIS — M79671 Pain in right foot: Secondary | ICD-10-CM

## 2012-08-15 DIAGNOSIS — M79641 Pain in right hand: Secondary | ICD-10-CM

## 2012-08-15 DIAGNOSIS — G5710 Meralgia paresthetica, unspecified lower limb: Secondary | ICD-10-CM

## 2012-08-15 DIAGNOSIS — M79609 Pain in unspecified limb: Secondary | ICD-10-CM

## 2012-08-15 DIAGNOSIS — M79642 Pain in left hand: Secondary | ICD-10-CM | POA: Insufficient documentation

## 2012-08-15 DIAGNOSIS — R269 Unspecified abnormalities of gait and mobility: Secondary | ICD-10-CM | POA: Insufficient documentation

## 2012-08-15 MED ORDER — FAMOTIDINE 40 MG PO TABS: 20.0000 mg | ORAL_TABLET | Freq: Two times a day (BID) | ORAL | Status: DC

## 2012-08-15 MED ORDER — OXYCODONE-ACETAMINOPHEN 5-325 MG PO TABS: 1.0000 | ORAL_TABLET | Freq: Four times a day (QID) | ORAL | Status: DC | PRN

## 2012-08-15 MED ORDER — GABAPENTIN 600 MG PO TABS: 600.0000 mg | ORAL_TABLET | Freq: Three times a day (TID) | ORAL | Status: DC

## 2012-08-15 NOTE — Assessment & Plan Note (Signed)
Refilled Gabapentin today

## 2012-08-15 NOTE — Assessment & Plan Note (Signed)
Rx refill Pepcid

## 2012-08-15 NOTE — Assessment & Plan Note (Signed)
Concern for osteoarthritis, rheumatoid arthritis  Will do xray of right hand and wrist to look for structural abnormalities due to either or due to fall  Rx #30 Percocet 1 pill q 6 prn

## 2012-08-15 NOTE — Patient Instructions (Addendum)
General Instructions: Follow up in 3-6 months   Treatment Goals:  Goals (1 Years of Data) as of 08/15/12         As of Today 03/17/12 03/17/12 03/17/12 03/17/12     Blood Pressure    . Blood Pressure < 140/90  102/68 98/62 107/62 97/56 112/76      Progress Toward Treatment Goals:  Treatment Goal 08/15/2012  Blood pressure at goal  Prevent falls unable to assess    Self Care Goals & Plans:  Self Care Goal 08/15/2012  Manage my medications take my medicines as prescribed; bring my medications to every visit; refill my medications on time  Monitor my health keep track of my blood pressure  Eat healthy foods drink diet soda or water instead of juice or soda; eat more vegetables; eat foods that are low in salt; eat baked foods instead of fried foods; eat fruit for snacks and desserts; eat smaller portions  Meeting treatment goals maintain the current self-care plan       Care Management & Community Referrals:  Referral 08/15/2012  Referrals made for care management support none needed  Referrals made to community resources none         Fall Prevention Falls are the leading cause of injuries, accidents, and accidental deaths in people over the age of 56. Falling is a real threat to your ability to live on your own. CAUSES   Poor eyesight or poor hearing can make you more likely to fall.   Illnesses and physical conditions can affect your strength and balance.   Poor lighting, throw rugs and pets in your home can make you more likely to trip or slip.   The side effects of some medicines can upset your balance and lead to falling. These include medicines for depression, sleep problems, high blood pressure, diabetes, and heart conditions.  PREVENTION  Be sure your home is as safe as possible. Here are some tips:  Wear shoes with non-skid soles (not house slippers).   Be sure your home and outside area are well lit.   Use night lights throughout your house, including  hallways and stairways.   Remove clutter and clean up spills on floors and walkways.   Remove throw rugs or fasten them to the floor with carpet tape. Tack down carpet edges.   Do not place electrical cords across pathways.   Install grab bars in your bathtub, shower, and toilet area. Towel bars should not be used as a grab bar.   Install handrails on both sides of stairways.   Do not climb on stools or stepladders. Get someone else to help with jobs that require climbing.   Do not wax your floors at all, or use a non-skid wax.   Repair uneven or unsafe sidewalks, walkways or stairs.   Keep frequently used items within reach.   Be aware of pets so you do not trip.  Get regular check-ups from your doctor, and take good care of yourself:  Have your eyes checked every year for vision changes, cataracts, glaucoma, and other eye problems. Wear eyeglasses as directed.   Have your hearing checked every 2 years, or anytime you or others think that you cannot hear well. Use hearing aids as directed.   See your caregiver if you have foot pain or corns. Sore feet can contribute to falls.   Let your caregiver know if a medicine is making you feel dizzy or making you lose your balance.  Use a cane, walker, or wheelchair as directed. Use walker or wheelchair brakes when getting in and out.   When you get up from bed, sit on the side of the bed for 1 to 2 minutes before you stand up. This will give your blood pressure time to adjust, and you will feel less dizzy.   If you need to go to the bathroom often, consider using a bedside commode.  Keep your body in good shape:  Get regular exercise, especially walking.   Do exercises to strengthen the muscles you use for walking and lifting.   Do not smoke.   Minimize use of alcohol.  SEEK IMMEDIATE MEDICAL CARE IF:   You feel dizzy, weak, or unsteady on your feet.   You feel confused.   You fall.  Document Released: 01/16/2005 Document  Revised: 01/05/2011 Document Reviewed: 07/13/2006 [facility] Patient Information 2012 [place], [place]. Fall Prevention and Home Safety Falls cause injuries and can affect all age groups. It is possible to use preventive measures to significantly decrease the likelihood of falls. There are many simple measures which can make your home safer and prevent falls. OUTDOORS  Repair cracks and edges of walkways and driveways.  Remove high doorway thresholds.  Trim shrubbery on the main path into your home.  Have good outside lighting.  Clear walkways of tools, rocks, debris, and clutter.  Check that handrails are not broken and are securely fastened. Both sides of steps should have handrails.  Have leaves, snow, and ice cleared regularly.  Use sand or salt on walkways during winter months.  In the garage, clean up grease or oil spills. BATHROOM  Install night lights.  Install grab bars by the toilet and in the tub and shower.  Use non-skid mats or decals in the tub or shower.  Place a plastic non-slip stool in the shower to sit on, if needed.  Keep floors dry and clean up all water on the floor immediately.  Remove soap buildup in the tub or shower on a regular basis.  Secure bath mats with non-slip, double-sided rug tape.  Remove throw rugs and tripping hazards from the floors. BEDROOMS  Install night lights.  Make sure a bedside light is easy to reach.  Do not use oversized bedding.  Keep a telephone by your bedside.  Have a firm chair with side arms to use for getting dressed.  Remove throw rugs and tripping hazards from the floor. KITCHEN  Keep handles on pots and pans turned toward the center of the stove. Use back burners when possible.  Clean up spills quickly and allow time for drying.  Avoid walking on wet floors.  Avoid hot utensils and knives.  Position shelves so they are not too high or low.  Place commonly used objects within easy reach.  If  necessary, use a sturdy step stool with a grab bar when reaching.  Keep electrical cables out of the way.  Do not use floor polish or wax that makes floors slippery. If you must use wax, use non-skid floor wax.  Remove throw rugs and tripping hazards from the floor. STAIRWAYS  Never leave objects on stairs.  Place handrails on both sides of stairways and use them. Fix any loose handrails. Make sure handrails on both sides of the stairways are as long as the stairs.  Check carpeting to make sure it is firmly attached along stairs. Make repairs to worn or loose carpet promptly.  Avoid placing throw rugs at the top  or bottom of stairways, or properly secure the rug with carpet tape to prevent slippage. Get rid of throw rugs, if possible.  Have an electrician put in a light switch at the top and bottom of the stairs. OTHER FALL PREVENTION TIPS  Wear low-heel or rubber-soled shoes that are supportive and fit well. Wear closed toe shoes.  When using a stepladder, make sure it is fully opened and both spreaders are firmly locked. Do not climb a closed stepladder.  Add color or contrast paint or tape to grab bars and handrails in your home. Place contrasting color strips on first and last steps.  Learn and use mobility aids as needed. Install an electrical emergency response system.  Turn on lights to avoid dark areas. Replace light bulbs that burn out immediately. Get light switches that glow.  Arrange furniture to create clear pathways. Keep furniture in the same place.  Firmly attach carpet with non-skid or double-sided tape.  Eliminate uneven floor surfaces.  Select a carpet pattern that does not visually hide the edge of steps.  Be aware of all pets. OTHER HOME SAFETY TIPS  Set the water temperature for 120 F (48.8 C).  Keep emergency numbers on or near the telephone.  Keep smoke detectors on every level of the home and near sleeping areas. Document Released: 01/06/2002  Document Revised: 07/18/2011 Document Reviewed: 04/07/2011 [facility] Patient Information 2014 [place], [place].

## 2012-08-15 NOTE — Progress Notes (Addendum)
  Subjective:    Patient ID: Briana Ortiz, female    DOB: 1950/03/22, 62 y.o.   MRN: [id number]  HPI Comments: 62 y.o PMH HTN (BP 102/68), GERD, osteoarthritis (hips s/p surgery on the left with Dr. Luiz Blare).  She presents for Rx refills Gabapentin, Pepcid.  She states she fell Memorial day and landed on her right side.  Her right hand has been hurting for 1 month. Pain is 6/10 aching worse in the morning with joint stiffness.  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  She denies numbness and tingling.  Also since her fall she has right foot pain.  She has a previous injury to her right foot 2 years ago.  She discussed this with Dr. Luiz Blare who imaged her foot and said if was okay.  She requests a medication for pain.    ROS per HPI     Review of Systems     Objective:   Physical Exam  Nursing note and vitals reviewed. Constitutional: She is oriented to person, place, and time. She appears well-developed and well-nourished. No distress.  HENT:  Head: Normocephalic and atraumatic.  Eyes: Conjunctivae are normal.  Musculoskeletal:       Arms:      Feet:  Neurological: She is alert and oriented to person, place, and time.  Skin: Skin is warm and dry. No rash noted. She is not diaphoretic.  Psychiatric: She has a normal mood and affect. Her behavior is normal. Judgment and thought content normal.          Assessment & Plan:

## 2012-08-15 NOTE — Assessment & Plan Note (Signed)
Fall Screening 03/06/2012 08/15/2012  Falls in the past year? No Yes  Number of falls in past year - 1  Was there an injury with Fall? - Yes      Assessment: Progress toward fall prevention goal:  unable to assess Comments: no falls since Memorial weekend  Plan: Fall prevention plans: given falls handout and information Educational resources provided: brochure Self management tools provided: home fall prevention checklist

## 2012-08-15 NOTE — Assessment & Plan Note (Signed)
BP Readings from Last 3 Encounters:  08/15/12 102/68  03/17/12 98/62  03/17/12 98/62    Lab Results  Component Value Date   NA 138 03/16/2012   K 3.6 03/16/2012   CREATININE 0.81 03/16/2012    Assessment: Blood pressure control: controlled Progress toward BP goal:  at goal Comments: none  Plan: Medications:  continue current medications Educational resources provided: brochure;video Self management tools provided:   Other plans: reassess blood pressure at follow up.

## 2012-08-15 NOTE — Assessment & Plan Note (Signed)
Pain for 1 month after fall.  Will try to get Xray from Dr. Luiz Blare though patient stated recent imaging was negative  If does not improve due to previous injury of this foot will refer back to Dr. Luiz Blare.

## 2012-08-22 NOTE — Progress Notes (Signed)
Case discussed with Dr. McLean at the time of the visit.  We reviewed the resident's history and exam and pertinent patient test results.  I agree with the assessment, diagnosis, and plan of care documented in the resident's note.     

## 2012-10-08 ENCOUNTER — Other Ambulatory Visit: Payer: Self-pay | Admitting: *Deleted

## 2012-10-08 MED ORDER — LISINOPRIL-HYDROCHLOROTHIAZIDE 20-25 MG PO TABS: 1.0000 | ORAL_TABLET | Freq: Every morning | ORAL | Status: DC

## 2012-10-08 NOTE — Telephone Encounter (Signed)
Message sent to front office for an appt; pt informed of refill.

## 2012-10-08 NOTE — Telephone Encounter (Signed)
Pls sch Dec or Jan appt with PCP

## 2012-10-08 NOTE — Telephone Encounter (Signed)
Pt states she is completely out ?

## 2012-11-01 ENCOUNTER — Other Ambulatory Visit: Payer: Self-pay | Admitting: *Deleted

## 2012-11-03 MED ORDER — DICLOFENAC SODIUM 1 % TD GEL: 4.0000 g | Freq: Four times a day (QID) | TRANSDERMAL | Status: DC

## 2013-01-09 ENCOUNTER — Encounter: Payer: Medicaid Other | Admitting: Internal Medicine

## 2013-03-06 ENCOUNTER — Encounter: Payer: Medicaid Other | Admitting: Internal Medicine

## 2013-03-27 ENCOUNTER — Encounter: Payer: Medicaid Other | Admitting: Internal Medicine

## 2013-04-01 ENCOUNTER — Telehealth: Payer: Self-pay | Admitting: *Deleted

## 2013-04-01 NOTE — Telephone Encounter (Signed)
Pt called with c/o rectal burning with BM's.  Onset 3 days ago. No change in BM's, she is regular.  She has tried using Vasoline and preporation H with little relief.  Burning is only with BM.  She does not feel any Hemorrhoids.  Pt has a scheduled appointment with PCP on 3/12. What else can she do?  Hx: hemorrhoidectomy   Pt # [phone]

## 2013-04-01 NOTE — Telephone Encounter (Signed)
If there is an opening and she is uncomfortable, bring her in this week.

## 2013-04-01 NOTE — Telephone Encounter (Signed)
If there is an opening and she is uncomfortable, bring her in this week. 

## 2013-04-01 NOTE — Telephone Encounter (Signed)
Pt informed and will call tomorrow and schedule.  She needs to get transportation.

## 2013-04-10 ENCOUNTER — Encounter: Payer: Medicaid Other | Admitting: Internal Medicine

## 2013-05-26 ENCOUNTER — Ambulatory Visit: Payer: Self-pay

## 2013-05-27 ENCOUNTER — Ambulatory Visit: Payer: Medicaid Other | Admitting: Internal Medicine

## 2013-08-07 ENCOUNTER — Encounter: Payer: Self-pay | Admitting: Internal Medicine

## 2013-08-12 ENCOUNTER — Other Ambulatory Visit: Payer: Self-pay | Admitting: Internal Medicine

## 2013-08-12 DIAGNOSIS — Z1231 Encounter for screening mammogram for malignant neoplasm of breast: Secondary | ICD-10-CM

## 2013-08-18 ENCOUNTER — Ambulatory Visit (HOSPITAL_COMMUNITY)
Admission: RE | Admit: 2013-08-18 | Discharge: 2013-08-18 | Disposition: A | Discharge status: Home / Self Care | Payer: Self-pay | Source: Ambulatory Visit | Attending: Internal Medicine | Admitting: Internal Medicine

## 2013-08-18 DIAGNOSIS — Z1231 Encounter for screening mammogram for malignant neoplasm of breast: Secondary | ICD-10-CM

## 2013-08-25 ENCOUNTER — Other Ambulatory Visit: Payer: Self-pay | Admitting: Internal Medicine

## 2013-09-11 ENCOUNTER — Encounter: Payer: Self-pay | Admitting: Internal Medicine

## 2013-09-11 ENCOUNTER — Ambulatory Visit (INDEPENDENT_AMBULATORY_CARE_PROVIDER_SITE_OTHER): Payer: Self-pay | Admitting: Internal Medicine

## 2013-09-11 VITALS — BP 113/83 | HR 59 | Temp 97.8°F | Ht 61.0 in | Wt 200.1 lb

## 2013-09-11 DIAGNOSIS — Z0000 Encounter for general adult medical examination without abnormal findings: Secondary | ICD-10-CM

## 2013-09-11 DIAGNOSIS — M79609 Pain in unspecified limb: Secondary | ICD-10-CM

## 2013-09-11 DIAGNOSIS — K219 Gastro-esophageal reflux disease without esophagitis: Secondary | ICD-10-CM

## 2013-09-11 DIAGNOSIS — M79644 Pain in right finger(s): Secondary | ICD-10-CM

## 2013-09-11 DIAGNOSIS — I10 Essential (primary) hypertension: Secondary | ICD-10-CM

## 2013-09-11 DIAGNOSIS — G5710 Meralgia paresthetica, unspecified lower limb: Secondary | ICD-10-CM

## 2013-09-11 DIAGNOSIS — M25512 Pain in left shoulder: Secondary | ICD-10-CM

## 2013-09-11 DIAGNOSIS — M25519 Pain in unspecified shoulder: Secondary | ICD-10-CM

## 2013-09-11 LAB — CBC WITH DIFFERENTIAL/PLATELET
Basophils Absolute: 0 10*3/uL (ref 0.0–0.1)
Basophils Relative: 0 % (ref 0–1)
Eosinophils Absolute: 0.3 10*3/uL (ref 0.0–0.7)
Eosinophils Relative: 4 % (ref 0–5)
HEMATOCRIT: 40.7 % (ref 36.0–46.0)
HEMOGLOBIN: 13.7 g/dL (ref 12.0–15.0)
LYMPHS PCT: 33 % (ref 12–46)
Lymphs Abs: 2.1 10*3/uL (ref 0.7–4.0)
MCH: 30.2 pg (ref 26.0–34.0)
MCHC: 33.7 g/dL (ref 30.0–36.0)
MCV: 89.8 fL (ref 78.0–100.0)
MONO ABS: 0.3 10*3/uL (ref 0.1–1.0)
MONOS PCT: 5 % (ref 3–12)
Neutro Abs: 3.7 10*3/uL (ref 1.7–7.7)
Neutrophils Relative %: 58 % (ref 43–77)
Platelets: 198 10*3/uL (ref 150–400)
RBC: 4.53 MIL/uL (ref 3.87–5.11)
RDW: 13.8 % (ref 11.5–15.5)
WBC: 6.3 10*3/uL (ref 4.0–10.5)

## 2013-09-11 LAB — COMPLETE METABOLIC PANEL WITH GFR
ALK PHOS: 79 U/L (ref 39–117)
ALT: 12 U/L (ref 0–35)
AST: 15 U/L (ref 0–37)
Albumin: 4.3 g/dL (ref 3.5–5.2)
BUN: 12 mg/dL (ref 6–23)
CO2: 32 mEq/L (ref 19–32)
Calcium: 9.8 mg/dL (ref 8.4–10.5)
Chloride: 97 mEq/L (ref 96–112)
Creat: 1.17 mg/dL — ABNORMAL HIGH (ref 0.50–1.10)
GFR, Est African American: 58 mL/min — ABNORMAL LOW
GFR, Est Non African American: 50 mL/min — ABNORMAL LOW
Glucose, Bld: 92 mg/dL (ref 70–99)
POTASSIUM: 3.5 meq/L (ref 3.5–5.3)
SODIUM: 137 meq/L (ref 135–145)
TOTAL PROTEIN: 6.9 g/dL (ref 6.0–8.3)
Total Bilirubin: 0.4 mg/dL (ref 0.2–1.2)

## 2013-09-11 MED ORDER — FAMOTIDINE 20 MG PO TABS: 20.0000 mg | ORAL_TABLET | Freq: Every day | ORAL | Status: DC

## 2013-09-11 MED ORDER — WRIST/THUMB SPLINT/RIGHT UNIV MISC: 1.0000 | Freq: Every day | Status: DC

## 2013-09-11 MED ORDER — OXYCODONE-ACETAMINOPHEN 5-325 MG PO TABS: 1.0000 | ORAL_TABLET | Freq: Every day | ORAL | Status: DC | PRN

## 2013-09-11 MED ORDER — LISINOPRIL-HYDROCHLOROTHIAZIDE 20-25 MG PO TABS: 1.0000 | ORAL_TABLET | Freq: Every morning | ORAL | Status: DC

## 2013-09-11 MED ORDER — FAMOTIDINE 20 MG PO TABS: 20.0000 mg | ORAL_TABLET | Freq: Two times a day (BID) | ORAL | Status: DC

## 2013-09-11 MED ORDER — GABAPENTIN 600 MG PO TABS: 600.0000 mg | ORAL_TABLET | Freq: Three times a day (TID) | ORAL | Status: DC

## 2013-09-11 NOTE — Patient Instructions (Addendum)
General Instructions: Please follow up in 4-6 months, sooner if needed Get your insurance worked out Get an Xray of your right hand Take care  Pick up medications from the pharmacy    Treatment Goals:  Goals (1 Years of Data) as of 09/11/13         As of Today 08/15/12     Blood Pressure    . Blood Pressure < 140/90  113/83 102/68      Progress Toward Treatment Goals:  Treatment Goal 09/11/2013  Blood pressure at goal  Prevent falls -    Self Care Goals & Plans:  Self Care Goal 09/11/2013  Manage my medications take my medicines as prescribed; bring my medications to every visit; refill my medications on time; follow the sick day instructions if I am sick  Monitor my health keep track of my weight; keep track of my blood pressure  Eat healthy foods eat more vegetables; eat fruit for snacks and desserts; eat baked foods instead of fried foods; eat smaller portions; drink diet soda or water instead of juice or soda; eat foods that are low in salt  Be physically active find an activity I enjoy  Meeting treatment goals maintain the current self-care plan    No flowsheet data found.   Care Management & Community Referrals:  Referral 09/11/2013  Referrals made for care management support none needed  Referrals made to community resources none       Arthritis, Nonspecific Arthritis is inflammation of a joint. This usually means pain, redness, warmth or swelling are present. One or more joints may be involved. There are a number of types of arthritis. Your caregiver may not be able to tell what type of arthritis you have right away. CAUSES  The most common cause of arthritis is the wear and tear on the joint (osteoarthritis). This causes damage to the cartilage, which can break down over time. The knees, hips, back and neck are most often affected by this type of arthritis. Other types of arthritis and common causes of joint pain include:  Sprains and other injuries near the  joint. Sometimes minor sprains and injuries cause pain and swelling that develop hours later.  Rheumatoid arthritis. This affects hands, feet and knees. It usually affects both sides of your body at the same time. It is often associated with chronic ailments, fever, weight loss and general weakness.  Crystal arthritis. Gout and pseudo gout can cause occasional acute severe pain, redness and swelling in the foot, ankle, or knee.  Infectious arthritis. Bacteria can get into a joint through a break in overlying skin. This can cause infection of the joint. Bacteria and viruses can also spread through the blood and affect your joints.  Drug, infectious and allergy reactions. Sometimes joints can become mildly painful and slightly swollen with these types of illnesses. SYMPTOMS   Pain is the main symptom.  Your joint or joints can also be red, swollen and warm or hot to the touch.  You may have a fever with certain types of arthritis, or even feel overall ill.  The joint with arthritis will hurt with movement. Stiffness is present with some types of arthritis. DIAGNOSIS  Your caregiver will suspect arthritis based on your description of your symptoms and on your exam. Testing may be needed to find the type of arthritis:  Blood and sometimes urine tests.  X-ray tests and sometimes CT or MRI scans.  Removal of fluid from the joint (arthrocentesis) is done to  check for bacteria, crystals or other causes. Your caregiver (or a specialist) will numb the area over the joint with a local anesthetic, and use a needle to remove joint fluid for examination. This procedure is only minimally uncomfortable.  Even with these tests, your caregiver may not be able to tell what kind of arthritis you have. Consultation with a specialist (rheumatologist) may be helpful. TREATMENT  Your caregiver will discuss with you treatment specific to your type of arthritis. If the specific type cannot be determined, then the  following general recommendations may apply. Treatment of severe joint pain includes:  Rest.  Elevation.  Anti-inflammatory medication (for example, ibuprofen) may be prescribed. Avoiding activities that cause increased pain.  Only take over-the-counter or prescription medicines for pain and discomfort as recommended by your caregiver.  Cold packs over an inflamed joint may be used for 10 to 15 minutes every hour. Hot packs sometimes feel better, but do not use overnight. Do not use hot packs if you are diabetic without your caregiver's permission.  A cortisone shot into arthritic joints may help reduce pain and swelling.  Any acute arthritis that gets worse over the next 1 to 2 days needs to be looked at to be sure there is no joint infection. Long-term arthritis treatment involves modifying activities and lifestyle to reduce joint stress jarring. This can include weight loss. Also, exercise is needed to nourish the joint cartilage and remove waste. This helps keep the muscles around the joint strong. HOME CARE INSTRUCTIONS   Do not take aspirin to relieve pain if gout is suspected. This elevates uric acid levels.  Only take over-the-counter or prescription medicines for pain, discomfort or fever as directed by your caregiver.  Rest the joint as much as possible.  If your joint is swollen, keep it elevated.  Use crutches if the painful joint is in your leg.  Drinking plenty of fluids may help for certain types of arthritis.  Follow your caregiver's dietary instructions.  Try low-impact exercise such as:  Swimming.  Water aerobics.  Biking.  Walking.  Morning stiffness is often relieved by a warm shower.  Put your joints through regular range-of-motion. SEEK MEDICAL CARE IF:   You do not feel better in 24 hours or are getting worse.  You have side effects to medications, or are not getting better with treatment. SEEK IMMEDIATE MEDICAL CARE IF:   You have a  fever.  You develop severe joint pain, swelling or redness.  Many joints are involved and become painful and swollen.  There is severe back pain and/or leg weakness.  You have loss of bowel or bladder control. Document Released: 02/24/2004 Document Revised: 04/10/2011 Document Reviewed: 03/11/2008 [facility] Patient Information 2015 [place], [place]. This information is not intended to replace advice given to you by your health care provider. Make sure you discuss any questions you have with your health care provider.

## 2013-09-11 NOTE — Progress Notes (Signed)
Subjective:    Patient ID: Briana Ortiz, female    DOB: 12/11/1950, 63 y.o.   MRN: [id number]  HPI Comments: 63 y.o PMH GERD, HTN, OA s/p total left hip replacement (Dr. Berenice Primas) with min. Degenerative change in right hip, degenerative changes in b/l knees   She presents for annual f/u  1) She needs Rx refills of Pepcid, Prinzide, Gabapentin 2)She c/o pain and swelling in right thumb x 3 months though no pain now.  She has sharp pains intermittently and has tried OTC version of med similar to [facility], Aleve w/o relief, OTC Tylenol. Only medication that has helped is narcotic in the past which she request Rx refill. When pain is present it is 8/10.  3)she c/o left shoulder pain intermittently not w/ ROM only with touching the upper outer left arm.  Pain is 7/10 when touched.  She denies limited ROM.       SH: no insurance currently pt plants to met with Marlana Latus, works at a clean[employer]  HM: Due for Tdap/Zosatvax will hold to when pt gets insurance      Review of Systems  Respiratory: Negative for shortness of breath.   Cardiovascular: Negative for chest pain.  Gastrointestinal: Negative for constipation.  Musculoskeletal: Positive for arthralgias.       Objective:   Physical Exam  Nursing note and vitals reviewed. Constitutional: She is oriented to person, place, and time. Vital signs are normal. She appears well-developed and well-nourished. She is cooperative. No distress.  HENT:  Head: Normocephalic and atraumatic.  Mouth/Throat: Oropharynx is clear and moist and mucous membranes are normal. She has dentures. No oropharyngeal exudate.  Eyes: Conjunctivae are normal. Pupils are equal, round, and reactive to light. Right eye exhibits no discharge. Left eye exhibits no discharge. No scleral icterus.  Cardiovascular: Normal rate, regular rhythm, S1 normal, S2 normal and normal heart sounds.   No murmur heard. No lower ext edema   Pulmonary/Chest: Effort normal and breath sounds  normal. No respiratory distress. She has no wheezes.  Abdominal: Soft. Bowel sounds are normal. There is no tenderness.  Musculoskeletal:       Arms: Neurological: She is alert and oriented to person, place, and time. Gait normal.  Skin: Skin is warm, dry and intact. No rash noted. She is not diaphoretic.  Psychiatric: She has a normal mood and affect. Her speech is normal and behavior is normal. Judgment and thought content normal. Cognition and memory are normal.          Assessment & Plan:

## 2013-09-12 ENCOUNTER — Encounter: Payer: Self-pay | Admitting: Internal Medicine

## 2013-09-12 DIAGNOSIS — M25519 Pain in unspecified shoulder: Secondary | ICD-10-CM | POA: Insufficient documentation

## 2013-09-12 DIAGNOSIS — Z791 Long term (current) use of non-steroidal anti-inflammatories (NSAID): Secondary | ICD-10-CM | POA: Insufficient documentation

## 2013-09-12 DIAGNOSIS — Z0000 Encounter for general adult medical examination without abnormal findings: Secondary | ICD-10-CM

## 2013-09-12 NOTE — Assessment & Plan Note (Addendum)
Due for Tdap/Zosatvax will hold for now until pt gets insurance  Will check CBC today

## 2013-09-12 NOTE — Assessment & Plan Note (Signed)
Rx refill Gabapentin today

## 2013-09-12 NOTE — Assessment & Plan Note (Addendum)
BP Readings from Last 3 Encounters:  09/11/13 113/83  08/15/12 102/68  03/17/12 98/62    Lab Results  Component Value Date   NA 137 09/11/2013   K 3.5 09/11/2013   CREATININE 1.17* 09/11/2013    Assessment: Blood pressure control: controlled Progress toward BP goal:  at goal Comments: none  Plan: Medications:  continue current medications (Prinzide 20-25 mg) qd-Rx refill today  Educational resources provided: brochure;handout;video Self management tools provided: other (see comments) Other plans: f/u in 4-6 months CMET today with elevated Cr. Advised to stay hydrated and stop Prinzide if n/v and/or diarrhea acute illness

## 2013-09-12 NOTE — Assessment & Plan Note (Signed)
Left shoulder pain at deltoid muscle site Ddx could be deltoid tendonopathy If does not improve will refer back to Dr. Berenice Primas for further w/u once pt gets insurance again

## 2013-09-12 NOTE — Assessment & Plan Note (Signed)
Rx refill Pepcid 20 mg qd  

## 2013-09-12 NOTE — Assessment & Plan Note (Addendum)
Noted at right thumb jt trapeziometacarpal.  Ddx inclues OA vs dequervens tenosynovitis  Will try thumb splint for now Rx Percocet 5-325 mg qd prn #30 no refills  Will image right hand once insurance gets re-instated with Marlana Latus

## 2013-09-15 NOTE — Progress Notes (Signed)
INTERNAL MEDICINE TEACHING ATTENDING ADDENDUM - Durk Carmen, MD: I reviewed and discussed at the time of visit with the resident Dr. McLean, the patient's medical history, physical examination, diagnosis and results of pertinent tests and treatment and I agree with the patient's care as documented.  

## 2013-09-16 ENCOUNTER — Telehealth: Payer: Self-pay | Admitting: Licensed Clinical Social Worker

## 2013-09-16 NOTE — Telephone Encounter (Signed)
[name] placed call to Briana Ortiz stating she is in need of new eyeglasses.  Pt states her previous eye exam has been over one year.  CSW informed [name] of program available to assist with eyeglasses but would need valid prescription.  Pt states at this time she can not afford an eye exam.  CSW referred [name] to [facility].  Pt encouraged to contact regarding sponsorship for eye exam and services.  Once [name] has a valid eyeglass prescription this worker can proceed with application for [employer].  [name] aware CSW is available to assist as needed.  CSW will sign off at this time.

## 2013-10-14 ENCOUNTER — Other Ambulatory Visit: Payer: Self-pay | Admitting: *Deleted

## 2013-10-14 DIAGNOSIS — M79644 Pain in right finger(s): Secondary | ICD-10-CM

## 2013-10-16 MED ORDER — OXYCODONE-ACETAMINOPHEN 5-325 MG PO TABS: 1.0000 | ORAL_TABLET | Freq: Every day | ORAL | Status: DC | PRN

## 2013-10-16 NOTE — Telephone Encounter (Signed)
Left a message on home hone ID recording about limited number of pain med and needs xray of hand - order in Epic since 08/2013 - per Dr Aundra Dubin. Hilda Blades Joslynn Jamroz RN 10/16/13 2PM

## 2013-11-06 ENCOUNTER — Encounter: Payer: Self-pay | Admitting: Internal Medicine

## 2013-11-06 ENCOUNTER — Ambulatory Visit (HOSPITAL_COMMUNITY)
Admission: RE | Admit: 2013-11-06 | Discharge: 2013-11-06 | Disposition: A | Discharge status: Home / Self Care | Payer: Self-pay | Source: Ambulatory Visit | Attending: Internal Medicine | Admitting: Internal Medicine

## 2013-11-06 DIAGNOSIS — M19041 Primary osteoarthritis, right hand: Secondary | ICD-10-CM | POA: Insufficient documentation

## 2013-11-06 DIAGNOSIS — M79644 Pain in right finger(s): Secondary | ICD-10-CM

## 2013-11-14 ENCOUNTER — Other Ambulatory Visit: Payer: Self-pay | Admitting: Internal Medicine

## 2013-11-14 DIAGNOSIS — M79644 Pain in right finger(s): Secondary | ICD-10-CM

## 2013-11-14 MED ORDER — OXYCODONE-ACETAMINOPHEN 5-325 MG PO TABS: 1.0000 | ORAL_TABLET | Freq: Every day | ORAL | Status: DC | PRN

## 2013-11-17 ENCOUNTER — Ambulatory Visit (INDEPENDENT_AMBULATORY_CARE_PROVIDER_SITE_OTHER): Payer: Self-pay | Admitting: *Deleted

## 2013-11-17 DIAGNOSIS — Z23 Encounter for immunization: Secondary | ICD-10-CM

## 2013-12-01 ENCOUNTER — Encounter: Payer: Self-pay | Admitting: Internal Medicine

## 2013-12-04 ENCOUNTER — Encounter: Payer: Self-pay | Admitting: Internal Medicine

## 2013-12-17 ENCOUNTER — Other Ambulatory Visit: Payer: Self-pay | Admitting: *Deleted

## 2013-12-17 DIAGNOSIS — M79644 Pain in right finger(s): Secondary | ICD-10-CM

## 2013-12-17 NOTE — Telephone Encounter (Signed)
Also needs 3 month supply  Lisinopril-HCTZ at [facility]. Hilda Blades Maebry Obrien RN 12/17/13 4:20PM

## 2013-12-18 ENCOUNTER — Other Ambulatory Visit: Payer: Self-pay | Admitting: Internal Medicine

## 2013-12-18 DIAGNOSIS — I10 Essential (primary) hypertension: Secondary | ICD-10-CM

## 2013-12-18 MED ORDER — LISINOPRIL-HYDROCHLOROTHIAZIDE 20-25 MG PO TABS: 1.0000 | ORAL_TABLET | Freq: Every morning | ORAL | Status: DC

## 2013-12-19 ENCOUNTER — Other Ambulatory Visit: Payer: Self-pay | Admitting: Internal Medicine

## 2013-12-19 MED ORDER — MELOXICAM 7.5 MG PO TABS: 7.5000 mg | ORAL_TABLET | Freq: Every day | ORAL | Status: DC

## 2013-12-22 ENCOUNTER — Ambulatory Visit: Payer: Self-pay

## 2013-12-22 NOTE — Telephone Encounter (Signed)
Talked to pt per Dr Aundra Dubin about med and aware Mobic at pharmacy.

## 2014-01-01 ENCOUNTER — Ambulatory Visit: Payer: Self-pay

## 2014-01-08 ENCOUNTER — Encounter (HOSPITAL_COMMUNITY): Payer: Self-pay

## 2014-01-08 ENCOUNTER — Emergency Department (HOSPITAL_COMMUNITY)
Admission: EM | Admit: 2014-01-08 | Discharge: 2014-01-08 | Disposition: A | Discharge status: Home / Self Care | Payer: Self-pay | Attending: Emergency Medicine | Admitting: Emergency Medicine

## 2014-01-08 ENCOUNTER — Emergency Department (HOSPITAL_COMMUNITY): Payer: Self-pay

## 2014-01-08 DIAGNOSIS — R102 Pelvic and perineal pain: Secondary | ICD-10-CM | POA: Insufficient documentation

## 2014-01-08 DIAGNOSIS — Z791 Long term (current) use of non-steroidal anti-inflammatories (NSAID): Secondary | ICD-10-CM | POA: Insufficient documentation

## 2014-01-08 DIAGNOSIS — I10 Essential (primary) hypertension: Secondary | ICD-10-CM | POA: Insufficient documentation

## 2014-01-08 DIAGNOSIS — M1990 Unspecified osteoarthritis, unspecified site: Secondary | ICD-10-CM | POA: Insufficient documentation

## 2014-01-08 DIAGNOSIS — Z79899 Other long term (current) drug therapy: Secondary | ICD-10-CM | POA: Insufficient documentation

## 2014-01-08 DIAGNOSIS — Z7982 Long term (current) use of aspirin: Secondary | ICD-10-CM | POA: Insufficient documentation

## 2014-01-08 DIAGNOSIS — D259 Leiomyoma of uterus, unspecified: Secondary | ICD-10-CM | POA: Insufficient documentation

## 2014-01-08 DIAGNOSIS — K219 Gastro-esophageal reflux disease without esophagitis: Secondary | ICD-10-CM | POA: Insufficient documentation

## 2014-01-08 LAB — URINALYSIS, ROUTINE W REFLEX MICROSCOPIC
Bilirubin Urine: NEGATIVE
Glucose, UA: NEGATIVE mg/dL
KETONES UR: NEGATIVE mg/dL
Leukocytes, UA: NEGATIVE
Nitrite: NEGATIVE
Protein, ur: NEGATIVE mg/dL
Specific Gravity, Urine: 1.009 (ref 1.005–1.030)
UROBILINOGEN UA: 0.2 mg/dL (ref 0.0–1.0)
pH: 8 (ref 5.0–8.0)

## 2014-01-08 LAB — COMPREHENSIVE METABOLIC PANEL
ALBUMIN: 4.3 g/dL (ref 3.5–5.2)
ALT: 15 U/L (ref 0–35)
AST: 20 U/L (ref 0–37)
Alkaline Phosphatase: 86 U/L (ref 39–117)
Anion gap: 13 (ref 5–15)
BUN: 11 mg/dL (ref 6–23)
CO2: 31 mEq/L (ref 19–32)
Calcium: 10.6 mg/dL — ABNORMAL HIGH (ref 8.4–10.5)
Chloride: 97 mEq/L (ref 96–112)
Creatinine, Ser: 0.84 mg/dL (ref 0.50–1.10)
GFR calc Af Amer: 85 mL/min — ABNORMAL LOW (ref >90)
GFR calc non Af Amer: 74 mL/min — ABNORMAL LOW (ref >90)
Glucose, Bld: 86 mg/dL (ref 70–99)
Potassium: 3.5 mEq/L — ABNORMAL LOW (ref 3.7–5.3)
Sodium: 141 mEq/L (ref 137–147)
Total Bilirubin: 0.5 mg/dL (ref 0.3–1.2)
Total Protein: 7.8 g/dL (ref 6.0–8.3)

## 2014-01-08 LAB — CBC WITH DIFFERENTIAL/PLATELET
Basophils Absolute: 0 10*3/uL (ref 0.0–0.1)
Basophils Relative: 0 % (ref 0–1)
EOS PCT: 1 % (ref 0–5)
Eosinophils Absolute: 0.1 10*3/uL (ref 0.0–0.7)
HCT: 42.2 % (ref 36.0–46.0)
Hemoglobin: 13.9 g/dL (ref 12.0–15.0)
LYMPHS ABS: 1.8 10*3/uL (ref 0.7–4.0)
LYMPHS PCT: 21 % (ref 12–46)
MCH: 30 pg (ref 26.0–34.0)
MCHC: 32.9 g/dL (ref 30.0–36.0)
MCV: 91.1 fL (ref 78.0–100.0)
Monocytes Absolute: 0.2 10*3/uL (ref 0.1–1.0)
Monocytes Relative: 3 % (ref 3–12)
Neutro Abs: 6.4 10*3/uL (ref 1.7–7.7)
Neutrophils Relative %: 75 % (ref 43–77)
PLATELETS: 202 10*3/uL (ref 150–400)
RBC: 4.63 MIL/uL (ref 3.87–5.11)
RDW: 12.8 % (ref 11.5–15.5)
WBC: 8.6 10*3/uL (ref 4.0–10.5)

## 2014-01-08 LAB — WET PREP, GENITAL
CLUE CELLS WET PREP: NONE SEEN
Trich, Wet Prep: NONE SEEN
YEAST WET PREP: NONE SEEN

## 2014-01-08 LAB — URINE MICROSCOPIC-ADD ON

## 2014-01-08 MED ORDER — FENTANYL CITRATE 0.05 MG/ML IJ SOLN
50.0000 ug | Freq: Once | INTRAMUSCULAR | Status: AC
  Administered 2014-01-08: 50 ug via INTRAVENOUS
  Filled 2014-01-08: qty 2

## 2014-01-08 MED ORDER — TRAMADOL HCL 50 MG PO TABS: 50.0000 mg | ORAL_TABLET | Freq: Four times a day (QID) | ORAL | Status: DC | PRN

## 2014-01-08 MED ORDER — MORPHINE SULFATE 4 MG/ML IJ SOLN
4.0000 mg | Freq: Once | INTRAMUSCULAR | Status: AC
  Administered 2014-01-08: 4 mg via INTRAVENOUS
  Filled 2014-01-08: qty 1

## 2014-01-08 MED ORDER — HYDROCODONE-ACETAMINOPHEN 5-325 MG PO TABS: 1.0000 | ORAL_TABLET | ORAL | Status: DC | PRN

## 2014-01-08 MED ORDER — ONDANSETRON HCL 4 MG/2ML IJ SOLN
4.0000 mg | Freq: Once | INTRAMUSCULAR | Status: AC
  Administered 2014-01-08: 4 mg via INTRAVENOUS
  Filled 2014-01-08: qty 2

## 2014-01-08 NOTE — ED Notes (Signed)
Pt transported to [facility]

## 2014-01-08 NOTE — Discharge Instructions (Signed)
Abdominal Pain, Women °Abdominal (stomach, pelvic, or belly) pain can be caused by many things. It is important to tell your doctor: °· The location of the pain. °· Does it come and go or is it present all the time? °· Are there things that start the pain (eating certain foods, exercise)? °· Are there other symptoms associated with the pain (fever, nausea, vomiting, diarrhea)? °All of this is helpful to know when trying to find the cause of the pain. °CAUSES  °· Stomach: virus or bacteria infection, or ulcer. °· Intestine: appendicitis (inflamed appendix), regional ileitis (Crohn's disease), ulcerative colitis (inflamed colon), irritable bowel syndrome, diverticulitis (inflamed diverticulum of the colon), or cancer of the stomach or intestine. °· Gallbladder disease or stones in the gallbladder. °· Kidney disease, kidney stones, or infection. °· Pancreas infection or cancer. °· Fibromyalgia (pain disorder). °· Diseases of the female organs: °¨ Uterus: fibroid (non-cancerous) tumors or infection. °¨ Fallopian tubes: infection or tubal pregnancy. °¨ Ovary: cysts or tumors. °¨ Pelvic adhesions (scar tissue). °¨ Endometriosis (uterus lining tissue growing in the pelvis and on the pelvic organs). °¨ Pelvic congestion syndrome (female organs filling up with blood just before the menstrual period). °¨ Pain with the menstrual period. °¨ Pain with ovulation (producing an egg). °¨ Pain with an IUD (intrauterine device, birth control) in the uterus. °¨ Cancer of the female organs. °· Functional pain (pain not caused by a disease, may improve without treatment). °· Psychological pain. °· Depression. °DIAGNOSIS  °Your doctor will decide the seriousness of your pain by doing an examination. °· Blood tests. °· X-rays. °· Ultrasound. °· CT scan (computed tomography, special type of X-ray). °· MRI (magnetic resonance imaging). °· Cultures, for infection. °· Barium enema (dye inserted in the large intestine, to better view it with  X-rays). °· Colonoscopy (looking in intestine with a lighted tube). °· Laparoscopy (minor surgery, looking in abdomen with a lighted tube). °· Major abdominal exploratory surgery (looking in abdomen with a large incision). °TREATMENT  °The treatment will depend on the cause of the pain.  °· Many cases can be observed and treated at home. °· Over-the-counter medicines recommended by your caregiver. °· Prescription medicine. °· Antibiotics, for infection. °· Birth control pills, for painful periods or for ovulation pain. °· Hormone treatment, for endometriosis. °· Nerve blocking injections. °· Physical therapy. °· Antidepressants. °· Counseling with a psychologist or psychiatrist. °· Minor or major surgery. °HOME CARE INSTRUCTIONS  °· Do not take laxatives, unless directed by your caregiver. °· Take over-the-counter pain medicine only if ordered by your caregiver. Do not take aspirin because it can cause an upset stomach or bleeding. °· Try a clear liquid diet (broth or water) as ordered by your caregiver. Slowly move to a bland diet, as tolerated, if the pain is related to the stomach or intestine. °· Have a thermometer and take your temperature several times a day, and record it. °· Bed rest and sleep, if it helps the pain. °· Avoid sexual intercourse, if it causes pain. °· Avoid stressful situations. °· Keep your follow-up appointments and tests, as your caregiver orders. °· If the pain does not go away with medicine or surgery, you may try: °¨ Acupuncture. °¨ Relaxation exercises (yoga, meditation). °¨ Group therapy. °¨ Counseling. °SEEK MEDICAL CARE IF:  °· You notice certain foods cause stomach pain. °· Your home care treatment is not helping your pain. °· You need stronger pain medicine. °· You want your IUD removed. °· You feel faint or   lightheaded. °· You develop nausea and vomiting. °· You develop a rash. °· You are having side effects or an allergy to your medicine. °SEEK IMMEDIATE MEDICAL CARE IF:  °· Your  pain does not go away or gets worse. °· You have a fever. °· Your pain is felt only in portions of the abdomen. The right side could possibly be appendicitis. The left lower portion of the abdomen could be colitis or diverticulitis. °· You are passing blood in your stools (bright red or black tarry stools, with or without vomiting). °· You have blood in your urine. °· You develop chills, with or without a fever. °· You pass out. °MAKE SURE YOU:  °· Understand these instructions. °· Will watch your condition. °· Will get help right away if you are not doing well or get worse. °Document Released: 11/13/2006 Document Revised: 06/02/2013 Document Reviewed: 12/03/2008 °[employer]® Patient Information ©2015 [employer]. This information is not intended to replace advice given to you by your health care provider. Make sure you discuss any questions you have with your health care provider. ° °

## 2014-01-08 NOTE — ED Provider Notes (Signed)
CSN: [id number]     Arrival date & time 01/08/14  1109 History   First MD Initiated Contact with Patient 01/08/14 1110     Chief Complaint  Patient presents with  . Abdominal Pain     (Consider location/radiation/quality/duration/timing/severity/associated sxs/prior Treatment) HPI Comments: Patient presents with abdominal pain. She states that she woke up about 4:00 this morning with pain in her left lower abdomen radiating to her back. She says it's been constant but waxing and waning. She's had a history of uterine fibroids in the past and it feels somewhat similar. She denies any nausea vomiting or diarrhea. She states she's been having normal bowel movements. She denies any fevers or chills. She denies any vaginal bleeding or discharge. She denies any urinary symptoms. She denies any past abdominal surgeries.  Patient is a 63 y.o. female presenting with abdominal pain.  Abdominal Pain Associated symptoms: no chest pain, no chills, no cough, no diarrhea, no fatigue, no fever, no hematuria, no nausea, no shortness of breath and no vomiting     Past Medical History  Diagnosis Date  . GERD (gastroesophageal reflux disease)   . Hypertension   . Uterine fibroid   . Arthritis    Past Surgical History  Procedure Laterality Date  . Hemorrhoid surgery    . Tubal ligation    . Tonsillectomy    . Total hip arthroplasty Left 03/15/2012    Procedure: TOTAL HIP ARTHROPLASTY ANTERIOR APPROACH;  Surgeon: Alta Corning, MD;  Location: [place];  Service: Orthopedics;  Laterality: Left;   Family History  Problem Relation Age of Onset  . Alcohol abuse Mother   . Breast cancer Neg Hx   . Colon cancer Neg Hx   . Ovarian cancer Neg Hx   . Stroke Neg Hx    History  Substance Use Topics  . Smoking status: Never Smoker   . Smokeless tobacco: Never Used  . Alcohol Use: 0.0 oz/week    5-6 Cans of beer per week   OB History    Gravida Para Term Preterm AB TAB SAB Ectopic Multiple Living   1 1 1        1      Review of Systems  Constitutional: Negative for fever, chills, diaphoresis and fatigue.  HENT: Negative for congestion, rhinorrhea and sneezing.   Eyes: Negative.   Respiratory: Negative for cough, chest tightness and shortness of breath.   Cardiovascular: Negative for chest pain and leg swelling.  Gastrointestinal: Positive for abdominal pain. Negative for nausea, vomiting, diarrhea and blood in stool.  Genitourinary: Negative for frequency, hematuria, flank pain and difficulty urinating.  Musculoskeletal: Negative for back pain and arthralgias.  Skin: Negative for rash.  Neurological: Negative for dizziness, speech difficulty, weakness, numbness and headaches.      Allergies  Review of patient's allergies indicates no known allergies.  Home Medications   Prior to Admission medications   Medication Sig Start Date End Date Taking? Authorizing Provider  aspirin EC 325 MG EC tablet Take 1 tablet (325 mg total) by mouth 2 (two) times daily after a meal. 03/17/12  Yes Dione Housekeeper, PA-C  Elastic Bandages & Supports (WRIST/THUMB SPLINT/RIGHT UNIV) MISC 1 Device by Does not apply route daily. 09/11/13  Yes Cresenciano Genre, MD  famotidine (PEPCID) 20 MG tablet Take 1 tablet (20 mg total) by mouth daily. 09/11/13 09/11/14 Yes Cresenciano Genre, MD  gabapentin (NEURONTIN) 600 MG tablet Take 1 tablet (600 mg total) by mouth 3 (three) times  daily. 09/11/13  Yes Cresenciano Genre, MD  lisinopril-hydrochlorothiazide (PRINZIDE,ZESTORETIC) 20-25 MG per tablet Take 1 tablet by mouth every morning. 12/18/13 12/18/14 Yes Cresenciano Genre, MD  loratadine (CLARITIN REDITABS) 10 MG dissolvable tablet Take 10 mg by mouth every morning. 03/14/11 01/08/14 Yes Ansel Bong, MD  meloxicam (MOBIC) 7.5 MG tablet Take 1 tablet (7.5 mg total) by mouth daily. 12/19/13  Yes Cresenciano Genre, MD  Multiple Vitamin (MULTIVITAMIN WITH MINERALS) TABS Take 1 tablet by mouth daily.   Yes Historical Provider, MD   oxyCODONE-acetaminophen (PERCOCET/ROXICET) 5-325 MG per tablet Take 1 tablet by mouth daily as needed for severe pain. Patient not taking: Reported on 01/08/2014 11/14/13   Cresenciano Genre, MD  traMADol (ULTRAM) 50 MG tablet Take 1 tablet (50 mg total) by mouth every 6 (six) hours as needed. 01/08/14   Malvin Johns, MD   BP 155/97 mmHg  Pulse 39  Temp(Src) 97.4 F (36.3 C) (Oral)  Resp 16  Ht 5\' 1"  (1.549 m)  Wt 200 lb (90.719 kg)  BMI 37.81 kg/m2  SpO2 93% Physical Exam  Constitutional: She is oriented to person, place, and time. She appears well-developed and well-nourished.  HENT:  Head: Normocephalic and atraumatic.  Eyes: Pupils are equal, round, and reactive to light.  Neck: Normal range of motion. Neck supple.  Cardiovascular: Normal rate, regular rhythm and normal heart sounds.   Pulmonary/Chest: Effort normal and breath sounds normal. No respiratory distress. She has no wheezes. She has no rales. She exhibits no tenderness.  Abdominal: Soft. Bowel sounds are normal. There is tenderness (moderate pain to the suprapubic and left lower abdomen). There is no rebound and no guarding.  Genitourinary:  Positive tenderness to the left adnexal area  Musculoskeletal: Normal range of motion. She exhibits no edema.  Lymphadenopathy:    She has no cervical adenopathy.  Neurological: She is alert and oriented to person, place, and time.  Skin: Skin is warm and dry. No rash noted.  Psychiatric: She has a normal mood and affect.    ED Course  Procedures (including critical care time) Labs Review Results for orders placed or performed during the hospital encounter of 01/08/14  Wet prep, genital  Result Value Ref Range   Yeast Wet Prep HPF POC NONE SEEN NONE SEEN   Trich, Wet Prep NONE SEEN NONE SEEN   Clue Cells Wet Prep HPF POC NONE SEEN NONE SEEN   WBC, Wet Prep HPF POC FEW (A) NONE SEEN  Urinalysis, Routine w reflex microscopic  Result Value Ref Range   Color, Urine YELLOW  YELLOW   APPearance CLEAR CLEAR   Specific Gravity, Urine 1.009 1.005 - 1.030   pH 8.0 5.0 - 8.0   Glucose, UA NEGATIVE NEGATIVE mg/dL   Hgb urine dipstick SMALL (A) NEGATIVE   Bilirubin Urine NEGATIVE NEGATIVE   Ketones, ur NEGATIVE NEGATIVE mg/dL   Protein, ur NEGATIVE NEGATIVE mg/dL   Urobilinogen, UA 0.2 0.0 - 1.0 mg/dL   Nitrite NEGATIVE NEGATIVE   Leukocytes, UA NEGATIVE NEGATIVE  Comprehensive metabolic panel  Result Value Ref Range   Sodium 141 137 - 147 mEq/L   Potassium 3.5 (L) 3.7 - 5.3 mEq/L   Chloride 97 96 - 112 mEq/L   CO2 31 19 - 32 mEq/L   Glucose, Bld 86 70 - 99 mg/dL   BUN 11 6 - 23 mg/dL   Creatinine, Ser 0.84 0.50 - 1.10 mg/dL   Calcium 10.6 (H) 8.4 - 10.5 mg/dL  Total Protein 7.8 6.0 - 8.3 g/dL   Albumin 4.3 3.5 - 5.2 g/dL   AST 20 0 - 37 U/L   ALT 15 0 - 35 U/L   Alkaline Phosphatase 86 39 - 117 U/L   Total Bilirubin 0.5 0.3 - 1.2 mg/dL   GFR calc non Af Amer 74 (L) >90 mL/min   GFR calc Af Amer 85 (L) >90 mL/min   Anion gap 13 5 - 15  CBC with Differential  Result Value Ref Range   WBC 8.6 4.0 - 10.5 K/uL   RBC 4.63 3.87 - 5.11 MIL/uL   Hemoglobin 13.9 12.0 - 15.0 g/dL   HCT 42.2 36.0 - 46.0 %   MCV 91.1 78.0 - 100.0 fL   MCH 30.0 26.0 - 34.0 pg   MCHC 32.9 30.0 - 36.0 g/dL   RDW 12.8 11.5 - 15.5 %   Platelets 202 150 - 400 K/uL   Neutrophils Relative % 75 43 - 77 %   Neutro Abs 6.4 1.7 - 7.7 K/uL   Lymphocytes Relative 21 12 - 46 %   Lymphs Abs 1.8 0.7 - 4.0 K/uL   Monocytes Relative 3 3 - 12 %   Monocytes Absolute 0.2 0.1 - 1.0 K/uL   Eosinophils Relative 1 0 - 5 %   Eosinophils Absolute 0.1 0.0 - 0.7 K/uL   Basophils Relative 0 0 - 1 %   Basophils Absolute 0.0 0.0 - 0.1 K/uL  Urine microscopic-add on  Result Value Ref Range   Squamous Epithelial / LPF RARE RARE   RBC / HPF 0-2 <3 RBC/hpf   US Transvaginal Non-ob  01/08/2014   CLINICAL DATA:  Left-sided pelvic pain.  EXAM: TRANSABDOMINAL AND TRANSVAGINAL ULTRASOUND OF PELVIS   TECHNIQUE: Both transabdominal and transvaginal ultrasound examinations of the pelvis were performed. Transabdominal technique was performed for global imaging of the pelvis including uterus, ovaries, adnexal regions, and pelvic cul-de-sac. It was necessary to proceed with endovaginal exam following the transabdominal exam to visualize the endometrium and ovaries.  COMPARISON:  Ultrasound of March 23, 2010.  FINDINGS: Uterus  Measurements: 11.2 x 8.7 x 7.2 cm. Large calcified fibroid is noted in the uterine fundus which measures 7.0 x 6.3 cm. Smaller fibroid measuring 2.6 x 1.7 cm is noted anteriorly in the uterus.  Endometrium  Not well visualized due to extensive shadowing from overlying calcified fibroid. Thickness cannot be determined.  Right ovary  Measurements: 3.7 x 3.2 x 2.7 cm. Normal appearance/no adnexal mass.  Left ovary  Measurements: 2.6 x 2.4 x 1.9 cm. Normal appearance/no adnexal mass.  Other findings  No free fluid.  IMPRESSION: Fibroid uterus as described above. No other abnormality seen in the pelvis.   Electronically Signed   By: Sabino Dick M.D.   On: 01/08/2014 15:26   US Pelvis Complete  01/08/2014   CLINICAL DATA:  Left-sided pelvic pain.  EXAM: TRANSABDOMINAL AND TRANSVAGINAL ULTRASOUND OF PELVIS  TECHNIQUE: Both transabdominal and transvaginal ultrasound examinations of the pelvis were performed. Transabdominal technique was performed for global imaging of the pelvis including uterus, ovaries, adnexal regions, and pelvic cul-de-sac. It was necessary to proceed with endovaginal exam following the transabdominal exam to visualize the endometrium and ovaries.  COMPARISON:  Ultrasound of March 23, 2010.  FINDINGS: Uterus  Measurements: 11.2 x 8.7 x 7.2 cm. Large calcified fibroid is noted in the uterine fundus which measures 7.0 x 6.3 cm. Smaller fibroid measuring 2.6 x 1.7 cm is noted anteriorly in the uterus.  Endometrium  Not well visualized due to extensive shadowing from  overlying calcified fibroid. Thickness cannot be determined.  Right ovary  Measurements: 3.7 x 3.2 x 2.7 cm. Normal appearance/no adnexal mass.  Left ovary  Measurements: 2.6 x 2.4 x 1.9 cm. Normal appearance/no adnexal mass.  Other findings  No free fluid.  IMPRESSION: Fibroid uterus as described above. No other abnormality seen in the pelvis.   Electronically Signed   By: Sabino Dick M.D.   On: 01/08/2014 15:26      Imaging Review US Transvaginal Non-ob  01/08/2014   CLINICAL DATA:  Left-sided pelvic pain.  EXAM: TRANSABDOMINAL AND TRANSVAGINAL ULTRASOUND OF PELVIS  TECHNIQUE: Both transabdominal and transvaginal ultrasound examinations of the pelvis were performed. Transabdominal technique was performed for global imaging of the pelvis including uterus, ovaries, adnexal regions, and pelvic cul-de-sac. It was necessary to proceed with endovaginal exam following the transabdominal exam to visualize the endometrium and ovaries.  COMPARISON:  Ultrasound of March 23, 2010.  FINDINGS: Uterus  Measurements: 11.2 x 8.7 x 7.2 cm. Large calcified fibroid is noted in the uterine fundus which measures 7.0 x 6.3 cm. Smaller fibroid measuring 2.6 x 1.7 cm is noted anteriorly in the uterus.  Endometrium  Not well visualized due to extensive shadowing from overlying calcified fibroid. Thickness cannot be determined.  Right ovary  Measurements: 3.7 x 3.2 x 2.7 cm. Normal appearance/no adnexal mass.  Left ovary  Measurements: 2.6 x 2.4 x 1.9 cm. Normal appearance/no adnexal mass.  Other findings  No free fluid.  IMPRESSION: Fibroid uterus as described above. No other abnormality seen in the pelvis.   Electronically Signed   By: Sabino Dick M.D.   On: 01/08/2014 15:26   US Pelvis Complete  01/08/2014   CLINICAL DATA:  Left-sided pelvic pain.  EXAM: TRANSABDOMINAL AND TRANSVAGINAL ULTRASOUND OF PELVIS  TECHNIQUE: Both transabdominal and transvaginal ultrasound examinations of the pelvis were performed.  Transabdominal technique was performed for global imaging of the pelvis including uterus, ovaries, adnexal regions, and pelvic cul-de-sac. It was necessary to proceed with endovaginal exam following the transabdominal exam to visualize the endometrium and ovaries.  COMPARISON:  Ultrasound of March 23, 2010.  FINDINGS: Uterus  Measurements: 11.2 x 8.7 x 7.2 cm. Large calcified fibroid is noted in the uterine fundus which measures 7.0 x 6.3 cm. Smaller fibroid measuring 2.6 x 1.7 cm is noted anteriorly in the uterus.  Endometrium  Not well visualized due to extensive shadowing from overlying calcified fibroid. Thickness cannot be determined.  Right ovary  Measurements: 3.7 x 3.2 x 2.7 cm. Normal appearance/no adnexal mass.  Left ovary  Measurements: 2.6 x 2.4 x 1.9 cm. Normal appearance/no adnexal mass.  Other findings  No free fluid.  IMPRESSION: Fibroid uterus as described above. No other abnormality seen in the pelvis.   Electronically Signed   By: Sabino Dick M.D.   On: 01/08/2014 15:26     EKG Interpretation None      MDM   Final diagnoses:  Pelvic pain in female  Uterine leiomyoma, unspecified location    Patient presents with left lower abdominal pain. Her pain seems to be mostly pelvic in nature. Pelvic exam is unremarkable other than pain in the left pelvic area. Her exam does not seem to be consistent with torsion. She does have uterine fibroids. Her pain seems to be consistent with her past uterine fibroid in her urine any signs of infection or fever.   She doesn't have pain that would  be more consistent with diverticulitis or other abdominal process.  She is well appearing.  Was discharged with rx for Ultram. She was encouraged to follow-up with her OB/GYN at the [facility] outpatient center. She was given return precautions.    Malvin Johns, MD 01/08/14 [phone]

## 2014-01-08 NOTE — ED Notes (Addendum)
Pt woke up with abdominal pain at 0400 this am in left lower quadrant. Pt stated she called her pcp but could not be seen until tomorrow and she couldn't take the pain so she called ems. Pt denies n/v/d, lbm was Sunday 01/04/14. Left lower abdomen is tender upon palpations and bowel sounds audible in all quadrants. Pt stated that she has fibroids

## 2014-01-09 LAB — GC/CHLAMYDIA PROBE AMP
CT Probe RNA: NEGATIVE
GC Probe RNA: NEGATIVE

## 2014-01-19 ENCOUNTER — Ambulatory Visit: Payer: Self-pay

## 2014-01-21 ENCOUNTER — Encounter: Payer: Self-pay | Admitting: Internal Medicine

## 2014-01-21 ENCOUNTER — Ambulatory Visit (INDEPENDENT_AMBULATORY_CARE_PROVIDER_SITE_OTHER): Payer: Self-pay | Admitting: Internal Medicine

## 2014-01-21 VITALS — BP 123/77 | HR 60 | Temp 98.0°F | Ht 61.0 in | Wt 197.3 lb

## 2014-01-21 DIAGNOSIS — I10 Essential (primary) hypertension: Secondary | ICD-10-CM

## 2014-01-21 DIAGNOSIS — D259 Leiomyoma of uterus, unspecified: Secondary | ICD-10-CM

## 2014-01-21 DIAGNOSIS — K219 Gastro-esophageal reflux disease without esophagitis: Secondary | ICD-10-CM

## 2014-01-21 DIAGNOSIS — Z0000 Encounter for general adult medical examination without abnormal findings: Secondary | ICD-10-CM

## 2014-01-21 DIAGNOSIS — M79641 Pain in right hand: Secondary | ICD-10-CM

## 2014-01-21 LAB — BASIC METABOLIC PANEL WITH GFR
BUN: 15 mg/dL (ref 6–23)
CALCIUM: 10 mg/dL (ref 8.4–10.5)
CO2: 29 meq/L (ref 19–32)
Chloride: 100 mEq/L (ref 96–112)
Creat: 1.02 mg/dL (ref 0.50–1.10)
GFR, EST AFRICAN AMERICAN: 69 mL/min
GFR, Est Non African American: 60 mL/min
GLUCOSE: 82 mg/dL (ref 70–99)
Potassium: 3.7 mEq/L (ref 3.5–5.3)
Sodium: 138 mEq/L (ref 135–145)

## 2014-01-21 LAB — LIPID PANEL
Cholesterol: 139 mg/dL (ref 0–200)
HDL: 49 mg/dL (ref >39)
LDL CALC: 70 mg/dL (ref 0–99)
Total CHOL/HDL Ratio: 2.8 Ratio
Triglycerides: 98 mg/dL (ref <150)
VLDL: 20 mg/dL (ref 0–40)

## 2014-01-21 MED ORDER — TRAMADOL HCL 50 MG PO TABS: 50.0000 mg | ORAL_TABLET | Freq: Three times a day (TID) | ORAL | Status: DC | PRN

## 2014-01-21 MED ORDER — IBUPROFEN 800 MG PO TABS
800.0000 mg | ORAL_TABLET | Freq: Three times a day (TID) | ORAL | Status: DC | PRN
Start: 2014-01-21 — End: 2014-11-18

## 2014-01-21 MED ORDER — FAMOTIDINE 20 MG PO TABS: 20.0000 mg | ORAL_TABLET | Freq: Every day | ORAL | Status: DC

## 2014-01-21 NOTE — Assessment & Plan Note (Signed)
Will check lipid panel today See HPI for further details

## 2014-01-21 NOTE — Patient Instructions (Signed)
General Instructions: I will refer you to OB/GYN but if pain develops before then go to [facility] Emergency Room Take care and Happy Holidays Take Ibuprofen and Tramadol as needed for pain  Follow up in 6 months    Treatment Goals:  Goals (1 Years of Data) as of 01/21/14          As of Today 01/08/14 01/08/14 01/08/14 01/08/14     Blood Pressure   . Blood Pressure < 140/90  123/77 155/83 119/80 102/84 155/97      Progress Toward Treatment Goals:  Treatment Goal 09/11/2013  Blood pressure at goal  Prevent falls -    Self Care Goals & Plans:  Self Care Goal 01/21/2014  Manage my medications take my medicines as prescribed; bring my medications to every visit; refill my medications on time  Monitor my health keep track of my blood pressure  Eat healthy foods drink diet soda or water instead of juice or soda; eat more vegetables; eat foods that are low in salt; eat baked foods instead of fried foods; eat fruit for snacks and desserts; eat smaller portions  Be physically active find an activity I enjoy  Meeting treatment goals maintain the current self-care plan    No flowsheet data found.   Care Management & Community Referrals:  Referral 01/21/2014  Referrals made for care management support none needed  Referrals made to community resources none       Fibroids Fibroids are lumps (tumors) that can occur any place in a woman's body. These lumps are not cancerous. Fibroids vary in size, weight, and where they grow. HOME CARE  Do not take aspirin.  Write down the number of pads or tampons you use during your period. Tell your doctor. This can help determine the best treatment for you. GET HELP RIGHT AWAY IF:  You have pain in your lower belly (abdomen) that is not helped with medicine.  You have cramps that are not helped with medicine.  You have more bleeding between or during your period.  You feel lightheaded or pass out (faint).  Your lower belly pain  gets worse. MAKE SURE YOU:  Understand these instructions.  Will watch your condition.  Will get help right away if you are not doing well or get worse. Document Released: 02/18/2010 Document Revised: 04/10/2011 Document Reviewed: 02/18/2010 [facility] Patient Information 2015 [place], [place]. This information is not intended to replace advice given to you by your health care provider. Make sure you discuss any questions you have with your health care provider.

## 2014-01-21 NOTE — Progress Notes (Signed)
   Subjective:    Patient ID: Briana Ortiz, female    DOB: 08-Sep-1950, 63 y.o.   MRN: [id number]  HPI Comments: 63 y.o PMH GERD, HTN, OA s/p total left hip replacement (Dr. Berenice Primas) with min. Degenerative change in right hip, degenerative changes in b/l knees, mild arthritis to hand, uterine fibroids  1. Uterine fibroid-Recently presented to ED with LLQ ab pain thought to be 2/2 uterine fibroids confirmed by [place] 12/10 with 11.2 x 8.7 x 7.2 cm and 2.6 x 1.7 cm fibroids. She was having LLQ ab pain at night which felt like a knife stabbing.  She tried Ibuprofen alt with Tramadol which helped to ease down the pain and Norco when the pain got really bad.  Pt to f/u with OB/GYN but Obama care insurance does not get effective until 03/2014. Also will defer pap smear to OB/GYN. She needs Rx refills of Ibuprofen and Tramdol 2. Hand arthritis-Mobic did not really help and she is no longer taking 3. 01/08/14 labs indicate low K 3.5 will check BMET today  4. H/o HTN controlled today 123/77 taking HCTZ-Lisinopril 20-25 mg daily.  Will check BMET and lipid panel today    HM -due for Zoster and Tdap but currently no insurance.  Recently received flu vaccine.  Will defer pap smear to OB/GYN       Review of Systems  Constitutional: Negative for fever and chills.  Respiratory: Negative for shortness of breath.   Cardiovascular: Negative for chest pain and leg swelling.  Gastrointestinal: Negative for abdominal pain and constipation.  Genitourinary: Negative for dysuria and vaginal bleeding.       Objective:   Physical Exam  Constitutional: She is oriented to person, place, and time. Vital signs are normal. She appears well-developed and well-nourished. She is cooperative. No distress.  HENT:  Head: Normocephalic and atraumatic.  Mouth/Throat: Oropharynx is clear and moist and mucous membranes are normal. She has dentures. No oropharyngeal exudate.  Eyes: Conjunctivae are normal. Right eye exhibits no  discharge. Left eye exhibits no discharge. No scleral icterus.  Cardiovascular: Normal rate, regular rhythm, S1 normal, S2 normal and normal heart sounds.   No murmur heard. No lower ext edema b/l  Pulmonary/Chest: Effort normal and breath sounds normal. No respiratory distress. She has no wheezes.  Abdominal: Soft. Bowel sounds are normal. There is no tenderness.  Musculoskeletal:       Arms: Neurological: She is alert and oriented to person, place, and time. Gait normal.  Skin: Skin is warm, dry and intact. No rash noted. She is not diaphoretic.  Psychiatric: She has a normal mood and affect. Her speech is normal and behavior is normal. Judgment and thought content normal. Cognition and memory are normal.  Nursing note and vitals reviewed.         Assessment & Plan:  F/u in 6 months, sooner if needed

## 2014-01-21 NOTE — Assessment & Plan Note (Signed)
Will refer to OB/GYN to consider options plus do pap smear Will do Ibuprofen 600 mg tid prn alt with Tramdol 50 q8 prn #90 no refills

## 2014-01-21 NOTE — Assessment & Plan Note (Signed)
Rx refill Pepcid 20 mg qd

## 2014-01-21 NOTE — Assessment & Plan Note (Signed)
Arthritis on imaging  Will continue to try NSAIDS for now D/c Percocet

## 2014-01-21 NOTE — Assessment & Plan Note (Signed)
BP Readings from Last 3 Encounters:  01/21/14 123/77  01/08/14 155/83  09/11/13 113/83    Lab Results  Component Value Date   NA 141 01/08/2014   K 3.5* 01/08/2014   CREATININE 0.84 01/08/2014    Assessment: Blood pressure control: controlled Progress toward BP goal:  At goal Comments: none  Plan: Medications:  continue current medications Lis 20 HCTZ 25 mg qd  Other plans: check BMET, lipid panel today f/u in 6 months

## 2014-01-21 NOTE — Progress Notes (Signed)
Case discussed with Dr. McLean at time of visit.  We reviewed the resident's history and exam and pertinent patient test results.  I agree with the assessment, diagnosis, and plan of care documented in the resident's note.  

## 2014-02-20 ENCOUNTER — Encounter: Payer: Self-pay | Admitting: Obstetrics & Gynecology

## 2014-03-11 ENCOUNTER — Ambulatory Visit (INDEPENDENT_AMBULATORY_CARE_PROVIDER_SITE_OTHER): Payer: Self-pay | Admitting: Obstetrics & Gynecology

## 2014-03-11 ENCOUNTER — Encounter: Payer: Self-pay | Admitting: Obstetrics & Gynecology

## 2014-03-11 VITALS — BP 126/65 | HR 56 | Temp 97.9°F | Resp 20 | Ht 61.0 in | Wt 198.3 lb

## 2014-03-11 DIAGNOSIS — R001 Bradycardia, unspecified: Secondary | ICD-10-CM

## 2014-03-11 DIAGNOSIS — Z01419 Encounter for gynecological examination (general) (routine) without abnormal findings: Secondary | ICD-10-CM

## 2014-03-11 DIAGNOSIS — D251 Intramural leiomyoma of uterus: Secondary | ICD-10-CM

## 2014-03-11 DIAGNOSIS — Z0000 Encounter for general adult medical examination without abnormal findings: Secondary | ICD-10-CM

## 2014-03-11 NOTE — Progress Notes (Signed)
Pt has Hx of fibroids and recently started having pain again.

## 2014-03-11 NOTE — Progress Notes (Signed)
   Subjective:    Patient ID: Briana Ortiz, female    DOB: September 25, 1950, 64 y.o.   MRN: [id number]  HPI  64 yo post menopausal lady here for follow up of her fibroids. She has known fibroids for greater than 15 years. She has been menopausal for the last 5 years. She is sexually active and denies dyspareunia. She is here because she had 1 day of colicky abdominal pain about 2 months ago. She has had no pain since then.  An u/s 12/15 showed no increase in size of her fibroids  Review of Systems     Objective:   Physical Exam [id number] Breathing normally Neuro- intact EG, vagina, cervix- normal Bimanual- 16 week hard NT uterus Adnexa not palpable       Assessment & Plan:  Fibroids- reassurance given Preventative- pap done today Bradycardia- check TSH

## 2014-03-12 LAB — TSH: TSH: 0.94 u[IU]/mL (ref 0.350–4.500)

## 2014-03-18 LAB — CYTOLOGY - PAP

## 2014-04-09 ENCOUNTER — Encounter: Payer: Self-pay | Admitting: *Deleted

## 2014-04-22 ENCOUNTER — Other Ambulatory Visit: Payer: Self-pay | Admitting: *Deleted

## 2014-04-22 DIAGNOSIS — G5710 Meralgia paresthetica, unspecified lower limb: Secondary | ICD-10-CM

## 2014-04-22 MED ORDER — GABAPENTIN 600 MG PO TABS: 600.0000 mg | ORAL_TABLET | Freq: Three times a day (TID) | ORAL | Status: DC

## 2014-04-29 ENCOUNTER — Encounter: Payer: Self-pay | Admitting: Licensed Clinical Social Worker

## 2014-05-26 ENCOUNTER — Telehealth: Payer: Self-pay | Admitting: Internal Medicine

## 2014-05-26 NOTE — Telephone Encounter (Signed)
Call to patient to confirm appointment for 05/27/14 at 2:45 lmtcb

## 2014-05-27 ENCOUNTER — Ambulatory Visit (INDEPENDENT_AMBULATORY_CARE_PROVIDER_SITE_OTHER): Payer: Self-pay | Admitting: Internal Medicine

## 2014-05-27 ENCOUNTER — Encounter: Payer: Self-pay | Admitting: Internal Medicine

## 2014-05-27 VITALS — BP 141/75 | HR 64 | Temp 97.9°F | Ht 61.0 in | Wt 198.7 lb

## 2014-05-27 DIAGNOSIS — I10 Essential (primary) hypertension: Secondary | ICD-10-CM

## 2014-05-27 DIAGNOSIS — K120 Recurrent oral aphthae: Secondary | ICD-10-CM

## 2014-05-27 DIAGNOSIS — M4806 Spinal stenosis, lumbar region: Secondary | ICD-10-CM

## 2014-05-27 DIAGNOSIS — M47896 Other spondylosis, lumbar region: Secondary | ICD-10-CM

## 2014-05-27 DIAGNOSIS — M1612 Unilateral primary osteoarthritis, left hip: Secondary | ICD-10-CM

## 2014-05-27 DIAGNOSIS — M549 Dorsalgia, unspecified: Principal | ICD-10-CM

## 2014-05-27 DIAGNOSIS — D259 Leiomyoma of uterus, unspecified: Secondary | ICD-10-CM

## 2014-05-27 DIAGNOSIS — G8929 Other chronic pain: Secondary | ICD-10-CM

## 2014-05-27 MED ORDER — LISINOPRIL-HYDROCHLOROTHIAZIDE 20-25 MG PO TABS: 1.0000 | ORAL_TABLET | Freq: Every morning | ORAL | Status: DC

## 2014-05-27 NOTE — Assessment & Plan Note (Signed)
Given info disc'ed warm salt water gargles

## 2014-05-27 NOTE — Assessment & Plan Note (Signed)
Reviewed previous MRI with mild spondylosis and neural foraminal narrowing L3-4, L4-5  Try tylenol alternating with Aleve  Consider PT when gets Obamacare  Will fill out paperwork for SCAT

## 2014-05-27 NOTE — Progress Notes (Signed)
   Subjective:    Patient ID: Briana Ortiz, female    DOB: Jun 09, 1950, 65 y.o.   MRN: [id number]  HPI Comments: 64 y.o pmh chronic pain (shoulder, back, knee, left hip s/p replacement), GERD, HTN  1. HTN BP 141/75 today taking prinzide 20-25 mg qd. Last labs 12/2013 wnl. She would like medication refill   2. Chronic back pain today 6/10 w/o radiation to legs or other alarm sx's. Pain is low back and she wears an OTC pain patch to help, also tried tub soaks.  She also uses Aleve at times, prn Tramadol as well.  Reviewed Xray 2001 negative but MRI 2006 with mild spondylosis, neural foraminal narrowing L3-L4, L4-L5.     3. This appt was get SCAT transportation renewed   SH: pending Obama care.       Review of Systems  Respiratory: Negative for shortness of breath.   Cardiovascular: Negative for chest pain.  Skin:       Apthous ulcer in mouth       Objective:   Physical Exam  Constitutional: She is oriented to person, place, and time. Vital signs are normal. She appears well-developed and well-nourished. She is cooperative. No distress.  HENT:  Head: Normocephalic and atraumatic.  Mouth/Throat: Oropharynx is clear and moist.    Eyes: Conjunctivae are normal. Right eye exhibits no discharge. Left eye exhibits no discharge. No scleral icterus.  Cardiovascular: Normal rate, regular rhythm, S1 normal, S2 normal and normal heart sounds.   No murmur heard. Neg edema   Pulmonary/Chest: Effort normal and breath sounds normal. No respiratory distress. She has no wheezes.  Musculoskeletal:       Back:  Neurological: She is alert and oriented to person, place, and time. Gait normal.  Skin: Skin is warm and dry. No rash noted. She is not diaphoretic.  Psychiatric: She has a normal mood and affect. Her speech is normal and behavior is normal. Judgment and thought content normal. Cognition and memory are normal.  Nursing note and vitals reviewed.         Assessment & Plan:  F/u in  3-6 months

## 2014-05-27 NOTE — Assessment & Plan Note (Signed)
BP Readings from Last 3 Encounters:  05/27/14 141/75  03/11/14 126/65  01/21/14 123/77    Lab Results  Component Value Date   NA 138 01/21/2014   K 3.7 01/21/2014   CREATININE 1.02 01/21/2014    Assessment: Blood pressure control: controlled Progress toward BP goal:  at goal Comments: none  Plan: Medications:  continue current medications Rx refill #90 no refills  Other plans: f/u in 3-6 months

## 2014-05-27 NOTE — Patient Instructions (Addendum)
General Instructions: Please continue Aleve (no more than 3 pills in 24 hours) alternating with Tylenol (no more than 3000 mg per day i.e if 500 mg no more than 6 pills)  Please follow up in 3-6 months, sooner if needed  Take care    Treatment Goals:  Goals (1 Years of Data) as of 05/27/14          As of Today 03/11/14 01/21/14 01/08/14 01/08/14     Blood Pressure   . Blood Pressure < 140/90  141/75 126/65 123/77 155/83 119/80      Progress Toward Treatment Goals:  Treatment Goal 05/27/2014  Blood pressure at goal  Prevent falls -    Self Care Goals & Plans:  Self Care Goal 05/27/2014  Manage my medications bring my medications to every visit; refill my medications on time; follow the sick day instructions if I am sick; take my medicines as prescribed  Monitor my health keep track of my blood pressure  Eat healthy foods drink diet soda or water instead of juice or soda; eat more vegetables; eat foods that are low in salt; eat baked foods instead of fried foods; eat fruit for snacks and desserts; eat smaller portions  Be physically active find an activity I enjoy  Meeting treatment goals maintain the current self-care plan    No flowsheet data found.   Care Management & Community Referrals:  Referral 05/27/2014  Referrals made for care management support none needed  Referrals made to community resources none       Back Pain, Adult Low back pain is very common. About 1 in 5 people have back pain.The cause of low back pain is rarely dangerous. The pain often gets better over time.About half of people with a sudden onset of back pain feel better in just 2 weeks. About 8 in 10 people feel better by 6 weeks.  CAUSES Some common causes of back pain include:  Strain of the muscles or ligaments supporting the spine.  Wear and tear (degeneration) of the spinal discs.  Arthritis.  Direct injury to the back. DIAGNOSIS Most of the time, the direct cause of low back pain is  not known.However, back pain can be treated effectively even when the exact cause of the pain is unknown.Answering your caregiver's questions about your overall health and symptoms is one of the most accurate ways to make sure the cause of your pain is not dangerous. If your caregiver needs more information, he or she may order lab work or imaging tests (X-rays or MRIs).However, even if imaging tests show changes in your back, this usually does not require surgery. HOME CARE INSTRUCTIONS For many people, back pain returns.Since low back pain is rarely dangerous, it is often a condition that people can learn to [facility] their own.   Remain active. It is stressful on the back to sit or stand in one place. Do not sit, drive, or stand in one place for more than 30 minutes at a time. Take short walks on level surfaces as soon as pain allows.Try to increase the length of time you walk each day.  Do not stay in bed.Resting more than 1 or 2 days can delay your recovery.  Do not avoid exercise or work.Your body is made to move.It is not dangerous to be active, even though your back may hurt.Your back will likely heal faster if you return to being active before your pain is gone.  Pay attention to your body when you bend  and lift. Many people have less discomfortwhen lifting if they bend their knees, keep the load close to their bodies,and avoid twisting. Often, the most comfortable positions are those that put less stress on your recovering back.  Find a comfortable position to sleep. Use a firm mattress and lie on your side with your knees slightly bent. If you lie on your back, put a pillow under your knees.  Only take over-the-counter or prescription medicines as directed by your caregiver. Over-the-counter medicines to reduce pain and inflammation are often the most helpful.Your caregiver may prescribe muscle relaxant drugs.These medicines help dull your pain so you can more quickly return to  your normal activities and healthy exercise.  Put ice on the injured area.  Put ice in a plastic bag.  Place a towel between your skin and the bag.  Leave the ice on for 15-20 minutes, 03-04 times a day for the first 2 to 3 days. After that, ice and heat may be alternated to reduce pain and spasms.  Ask your caregiver about trying back exercises and gentle massage. This may be of some benefit.  Avoid feeling anxious or stressed.Stress increases muscle tension and can worsen back pain.It is important to recognize when you are anxious or stressed and learn ways to manage it.Exercise is a great option. SEEK MEDICAL CARE IF:  You have pain that is not relieved with rest or medicine.  You have pain that does not improve in 1 week.  You have new symptoms.  You are generally not feeling well. SEEK IMMEDIATE MEDICAL CARE IF:   You have pain that radiates from your back into your legs.  You develop new bowel or bladder control problems.  You have unusual weakness or numbness in your arms or legs.  You develop nausea or vomiting.  You develop abdominal pain.  You feel faint. Document Released: 01/16/2005 Document Revised: 07/18/2011 Document Reviewed: 05/20/2013 [facility] Patient Information 2015 [place], [place]. This information is not intended to replace advice given to you by your health care provider. Make sure you discuss any questions you have with your health care provider.

## 2014-05-27 NOTE — Assessment & Plan Note (Signed)
Ab pain resolved  Pt plans to not have hysterectomy

## 2014-05-27 NOTE — Assessment & Plan Note (Signed)
Try tylenol alternating with Aleve

## 2014-05-31 NOTE — Progress Notes (Signed)
Case discussed with Dr. McLean at time of visit.  We reviewed the resident's history and exam and pertinent patient test results.  I agree with the assessment, diagnosis, and plan of care documented in the resident's note.  

## 2014-06-17 ENCOUNTER — Encounter: Payer: Self-pay | Admitting: *Deleted

## 2014-08-17 ENCOUNTER — Telehealth: Payer: Self-pay | Admitting: Internal Medicine

## 2014-08-17 NOTE — Telephone Encounter (Signed)
Call to patient to confirm appointment for 08/18/14 at 3:45 lmtcb

## 2014-08-18 ENCOUNTER — Encounter: Payer: Self-pay | Admitting: Internal Medicine

## 2014-08-24 ENCOUNTER — Telehealth: Payer: Self-pay | Admitting: Internal Medicine

## 2014-08-24 NOTE — Telephone Encounter (Signed)
Call to patient to confirm appointment for 08/25/14 at 2:45 lmtcb

## 2014-08-25 ENCOUNTER — Encounter: Payer: Self-pay | Admitting: Internal Medicine

## 2014-11-13 ENCOUNTER — Other Ambulatory Visit: Payer: Self-pay

## 2014-11-13 DIAGNOSIS — Z1231 Encounter for screening mammogram for malignant neoplasm of breast: Secondary | ICD-10-CM

## 2014-11-18 ENCOUNTER — Ambulatory Visit (INDEPENDENT_AMBULATORY_CARE_PROVIDER_SITE_OTHER): Payer: 59 | Admitting: Internal Medicine

## 2014-11-18 VITALS — BP 131/68 | HR 84 | Temp 98.0°F | Ht 61.0 in | Wt 198.5 lb

## 2014-11-18 DIAGNOSIS — I10 Essential (primary) hypertension: Secondary | ICD-10-CM | POA: Diagnosis not present

## 2014-11-18 DIAGNOSIS — K219 Gastro-esophageal reflux disease without esophagitis: Secondary | ICD-10-CM | POA: Diagnosis not present

## 2014-11-18 DIAGNOSIS — M1612 Unilateral primary osteoarthritis, left hip: Secondary | ICD-10-CM

## 2014-11-18 DIAGNOSIS — M549 Dorsalgia, unspecified: Secondary | ICD-10-CM

## 2014-11-18 DIAGNOSIS — G8929 Other chronic pain: Secondary | ICD-10-CM

## 2014-11-18 DIAGNOSIS — G5710 Meralgia paresthetica, unspecified lower limb: Secondary | ICD-10-CM

## 2014-11-18 MED ORDER — LISINOPRIL-HYDROCHLOROTHIAZIDE 20-25 MG PO TABS: 1.0000 | ORAL_TABLET | Freq: Every morning | ORAL | Status: DC

## 2014-11-18 MED ORDER — GABAPENTIN 600 MG PO TABS: 600.0000 mg | ORAL_TABLET | Freq: Two times a day (BID) | ORAL | Status: DC

## 2014-11-18 MED ORDER — FAMOTIDINE 20 MG PO TABS: 20.0000 mg | ORAL_TABLET | Freq: Every day | ORAL | Status: DC

## 2014-11-18 NOTE — Patient Instructions (Signed)
-   Increase Gabapentin from 1 time daily to 2 times daily - Limit advil to 400 mg (2 pills) twice daily  - follow up in 6 months  General Instructions:   Please bring your medicines with you each time you come to clinic.  Medicines may include prescription medications, over-the-counter medications, herbal remedies, eye drops, vitamins, or other pills.   Progress Toward Treatment Goals:  Treatment Goal 05/27/2014  Blood pressure at goal  Prevent falls -    Self Care Goals & Plans:  Self Care Goal 05/27/2014  Manage my medications bring my medications to every visit; refill my medications on time; follow the sick day instructions if I am sick; take my medicines as prescribed  Monitor my health keep track of my blood pressure  Eat healthy foods drink diet soda or water instead of juice or soda; eat more vegetables; eat foods that are low in salt; eat baked foods instead of fried foods; eat fruit for snacks and desserts; eat smaller portions  Be physically active find an activity I enjoy  Meeting treatment goals maintain the current self-care plan    No flowsheet data found.   Care Management & Community Referrals:  Referral 05/27/2014  Referrals made for care management support none needed  Referrals made to community resources none

## 2014-11-20 ENCOUNTER — Encounter: Payer: Self-pay | Admitting: Internal Medicine

## 2014-11-20 ENCOUNTER — Ambulatory Visit
Admission: RE | Admit: 2014-11-20 | Discharge: 2014-11-20 | Disposition: A | Discharge status: Home / Self Care | Payer: 59 | Source: Ambulatory Visit

## 2014-11-20 DIAGNOSIS — Z1231 Encounter for screening mammogram for malignant neoplasm of breast: Secondary | ICD-10-CM

## 2014-11-20 NOTE — Progress Notes (Signed)
   Subjective:    Patient ID: Briana Ortiz, female    DOB: [birth year], 64 y.o.   MRN: [id number]  HPI Ms. Goens is a 64yo woman with PMHx of HTN, GERD, osteoarthritis of the left hip, and chronic back pain who presents today for follow up of her chronic medical problems as listed below.  HTN: BP well controlled at 131/68. Patient is on Lisinopril-HCTZ 20-25 mg daily.  GERD: Her symptoms are well controlled on Famotidine 20 mg daily.   OA Left Hip: Patient reports she had a total hip replacement surgery 3 years ago. She reports her pain is fairly controlled with Gabapentin 600 mg daily at bedtime and Advil 400 mg twice daily.   Chronic Back Pain: Reports her pain is controlled with Gabapentin 600 mg daily at bedtime and Advil 400 mg BID.   Review of Systems General: Denies fever, chills, night sweats, changes in weight, changes in appetite HEENT: Denies headaches, ear pain, changes in vision, rhinorrhea, sore throat CV: Denies CP, palpitations, SOB, orthopnea Pulm: Denies SOB, cough, wheezing GI: Denies abdominal pain, nausea, vomiting, diarrhea, constipation, melena, hematochezia GU: Denies dysuria, hematuria, frequency Msk: Denies muscle cramps Neuro: Denies weakness, numbness, tingling Skin: Denies rashes, bruising Psych: Denies depression, anxiety, hallucinations    Objective:   Physical Exam General: alert, sitting up, NAD HEENT: [place]/AT, EOMI, sclera anicteric, mucus membranes moist CV: RRR, no m/g/r Pulm: CTA bilaterally, breaths non-labored Abd: BS+, soft, non-tender Ext: warm, no peripheral edema Neuro: alert and oriented x 3, strength 5/5 in upper and lower extremities bilaterally, gait normal     Assessment & Plan:  Please refer to A&P documentation.

## 2014-11-20 NOTE — Assessment & Plan Note (Addendum)
BP Readings from Last 3 Encounters:  11/18/14 131/68  05/27/14 141/75  03/11/14 126/65    Lab Results  Component Value Date   NA 138 01/21/2014   K 3.7 01/21/2014   CREATININE 1.02 01/21/2014    Assessment: Blood pressure control: controlled Progress toward BP goal:  at goal Comments: BP well controlled.  Plan: Medications:  Continue Lisinopril-HCTZ 20-25 mg daily Other: Refill provided.

## 2014-11-20 NOTE — Assessment & Plan Note (Signed)
Continue Famotidine 20 mg daily. Refill provided.

## 2014-11-20 NOTE — Assessment & Plan Note (Signed)
Symptoms controlled. Will increase Gabapentin to 600 mg BID since left hip pain is uncontrolled. Limit Advil to 400 mg BID.

## 2014-11-20 NOTE — Assessment & Plan Note (Signed)
Patient with some discomfort. Instructed to increase Gabapentin to 600 mg BID and to limit ibuprofen to 400 mg BID.

## 2014-11-26 NOTE — Progress Notes (Signed)
Internal Medicine Clinic Attending  Case discussed with Dr. Rivet soon after the resident saw the patient.  We reviewed the resident's history and exam and pertinent patient test results.  I agree with the assessment, diagnosis, and plan of care documented in the resident's note.  

## 2015-03-09 ENCOUNTER — Encounter: Payer: Self-pay | Admitting: Internal Medicine

## 2015-03-23 ENCOUNTER — Encounter: Payer: Self-pay | Admitting: Internal Medicine

## 2015-07-01 ENCOUNTER — Other Ambulatory Visit: Payer: Self-pay | Admitting: Internal Medicine

## 2015-07-19 ENCOUNTER — Ambulatory Visit: Payer: Self-pay | Admitting: Internal Medicine

## 2015-07-19 ENCOUNTER — Encounter: Payer: Self-pay | Admitting: Internal Medicine

## 2015-08-24 ENCOUNTER — Ambulatory Visit (INDEPENDENT_AMBULATORY_CARE_PROVIDER_SITE_OTHER): Payer: BLUE CROSS/BLUE SHIELD | Admitting: Internal Medicine

## 2015-08-24 ENCOUNTER — Encounter (INDEPENDENT_AMBULATORY_CARE_PROVIDER_SITE_OTHER): Payer: Self-pay

## 2015-08-24 ENCOUNTER — Encounter: Payer: Self-pay | Admitting: Internal Medicine

## 2015-08-24 VITALS — BP 112/68 | HR 60 | Temp 97.5°F | Ht 61.0 in | Wt 196.4 lb

## 2015-08-24 DIAGNOSIS — M1612 Unilateral primary osteoarthritis, left hip: Secondary | ICD-10-CM | POA: Diagnosis not present

## 2015-08-24 DIAGNOSIS — M79642 Pain in left hand: Secondary | ICD-10-CM

## 2015-08-24 DIAGNOSIS — L299 Pruritus, unspecified: Secondary | ICD-10-CM

## 2015-08-24 DIAGNOSIS — I10 Essential (primary) hypertension: Secondary | ICD-10-CM | POA: Diagnosis not present

## 2015-08-24 DIAGNOSIS — L282 Other prurigo: Secondary | ICD-10-CM

## 2015-08-24 DIAGNOSIS — M549 Dorsalgia, unspecified: Secondary | ICD-10-CM

## 2015-08-24 DIAGNOSIS — Z79899 Other long term (current) drug therapy: Secondary | ICD-10-CM

## 2015-08-24 DIAGNOSIS — G8929 Other chronic pain: Secondary | ICD-10-CM

## 2015-08-24 MED ORDER — TRIAMCINOLONE ACETONIDE 0.1 % EX CREA: TOPICAL_CREAM | Freq: Two times a day (BID) | CUTANEOUS | Status: DC

## 2015-08-24 NOTE — Patient Instructions (Signed)
General Instructions: - Use the Triamcinolone-kenalog cream twice daily for your rash - Take Aleve 2 pills twice daily for your hand pain and back pain  Please bring your medicines with you each time you come to clinic.  Medicines may include prescription medications, over-the-counter medications, herbal remedies, eye drops, vitamins, or other pills.   Progress Toward Treatment Goals:  Treatment Goal 11/18/2014  Blood pressure at goal  Prevent falls -    Self Care Goals & Plans:  Self Care Goal 11/18/2014  Manage my medications take my medicines as prescribed; bring my medications to every visit; refill my medications on time  Monitor my health -  Eat healthy foods eat more vegetables; eat foods that are low in salt; eat baked foods instead of fried foods  Be physically active find an activity I enjoy  Meeting treatment goals -    No flowsheet data found.   Care Management & Community Referrals:  Referral 05/27/2014  Referrals made for care management support none needed  Referrals made to community resources none

## 2015-08-25 LAB — BMP8+ANION GAP
ANION GAP: 20 mmol/L — AB (ref 10.0–18.0)
BUN/Creatinine Ratio: 18 (ref 12–28)
BUN: 16 mg/dL (ref 8–27)
CALCIUM: 9.9 mg/dL (ref 8.7–10.3)
CHLORIDE: 97 mmol/L (ref 96–106)
CO2: 25 mmol/L (ref 18–29)
Creatinine, Ser: 0.9 mg/dL (ref 0.57–1.00)
GFR calc Af Amer: 79 mL/min/{1.73_m2} (ref >59)
GFR, EST NON AFRICAN AMERICAN: 68 mL/min/{1.73_m2} (ref >59)
GLUCOSE: 66 mg/dL (ref 65–99)
Potassium: 3.9 mmol/L (ref 3.5–5.2)
Sodium: 142 mmol/L (ref 134–144)

## 2015-08-26 ENCOUNTER — Other Ambulatory Visit: Payer: Self-pay | Admitting: Internal Medicine

## 2015-08-26 ENCOUNTER — Telehealth: Payer: Self-pay | Admitting: Internal Medicine

## 2015-08-26 ENCOUNTER — Other Ambulatory Visit: Payer: Self-pay | Admitting: *Deleted

## 2015-08-26 ENCOUNTER — Encounter: Payer: Self-pay | Admitting: Internal Medicine

## 2015-08-26 DIAGNOSIS — L282 Other prurigo: Secondary | ICD-10-CM | POA: Insufficient documentation

## 2015-08-26 DIAGNOSIS — G5710 Meralgia paresthetica, unspecified lower limb: Secondary | ICD-10-CM

## 2015-08-26 DIAGNOSIS — L309 Dermatitis, unspecified: Secondary | ICD-10-CM

## 2015-08-26 DIAGNOSIS — K219 Gastro-esophageal reflux disease without esophagitis: Secondary | ICD-10-CM

## 2015-08-26 MED ORDER — GABAPENTIN 600 MG PO TABS: 600.0000 mg | ORAL_TABLET | Freq: Two times a day (BID) | ORAL | 1 refills | Status: DC

## 2015-08-26 MED ORDER — TRIAMCINOLONE ACETONIDE 0.1 % EX CREA: 1.0000 "application " | TOPICAL_CREAM | Freq: Two times a day (BID) | CUTANEOUS | 1 refills | Status: DC

## 2015-08-26 MED ORDER — LORATADINE 10 MG PO TBDP: 10.0000 mg | ORAL_TABLET | ORAL | 1 refills | Status: AC

## 2015-08-26 MED ORDER — FAMOTIDINE 20 MG PO TABS: 20.0000 mg | ORAL_TABLET | Freq: Every day | ORAL | 1 refills | Status: DC

## 2015-08-26 MED ORDER — LISINOPRIL-HYDROCHLOROTHIAZIDE 20-25 MG PO TABS: 1.0000 | ORAL_TABLET | Freq: Every morning | ORAL | 1 refills | Status: DC

## 2015-08-26 NOTE — Telephone Encounter (Signed)
Also pharm did not receive the triamcinclone cream you ordered, please send a new script

## 2015-08-26 NOTE — Assessment & Plan Note (Signed)
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 There was some concern for DeQuervain's tenosynovitis given her pain over the area of the snuffbox, but her Finkelstein's test was negative so this seems less likely. Recommended for her to use Aleve as needed for pain. Will continue to monitor.

## 2015-08-26 NOTE — Assessment & Plan Note (Signed)
BP well-controlled -Continue current regimen 

## 2015-08-26 NOTE — Assessment & Plan Note (Signed)
Her rash seems most consistent with a contact dermatitis although the aggravating etiology is not clear. Will have her try Triamcinolone-Kenalog cream twice daily to area to see if this helps. She was advised to return to clinic if this does not improve in the next few weeks.

## 2015-08-26 NOTE — Assessment & Plan Note (Signed)
Her pain is well controlled with salonpas patches. Recommended for her to continue using these and use Aleve or Tylenol if needed.

## 2015-08-26 NOTE — Progress Notes (Signed)
   CC: "Itchy rash on both shoulders"  HPI:  Briana Ortiz is a 65 y.o. woman with PMHx as noted below who presents today for evaluation of an "itchy rash on both shoulders."  Pruritic Rash: She reports the rash started about 1 week ago. She states it first appeared on her right shoulder and now has noticed a few "spots" on her left shoulder. She notes the rash is still worse on the right side. She denies any new soaps, detergents, clothes, lotions, or perfumes. She denies any new medications. She has been using Benadryl itch relief which has alleviated her symptoms. She thinks going out in the sun has made her rash worse. She also works at a [employer] twice a week. She denies any drainage or bleeding related to the rash.  HTN: BP controlled at 112/68. She is taking Lisinopril-HCTZ.  OA Left Hip: Reports her left hip pain is well controlled on Aleve as needed. She reports taking Aleve about once per day, sometimes twice daily.  Chronic Back Pain: Reports her pain is well controlled with using salonpas patches. She denies any difficulty in movement or walking. Pain does not prevent her from doing her ADLs or social activities.  Left Hand Pain: Reports her left hand pain started a few months ago and has been intermittent. She describes having a sharp, intermittent pain over the left 1st metacarpal that extends up to the lateral wrist. She denies any injury or repetitive movements.  Past Medical History:  Diagnosis Date  . Arthritis   . GERD (gastroesophageal reflux disease)   . Hypertension   . Uterine fibroid     Review of Systems:  All negative except for those noted in HPI.  Physical Exam:  Vitals:   08/24/15 1611  BP: 112/68  Pulse: 60  Temp: 97.5 F (36.4 C)  TempSrc: Oral  Weight: 196 lb 6.4 oz (89.1 kg)  Height: 5\' 1"  (1.549 m)   General: well-nourished woman, sitting up, NAD HEENT: [place]/AT, EOMI, sclera anicteric, mucus membranes moist CV: RRR, no  m/g/r Pulm: CTA bilaterally, breaths non-labored Ext: warm, no peripheral edema. There is mild tenderness to palpation of the left 1st metacarpal and snuffbox area. Finkelstein's test negative. She has full ROM of her fingers and wrists on both sides. Neuro: alert and oriented x 3  Skin: There is a maculopapular rash with underlying erythema present over the right shoulder and lateral arm. Rash is worse on the right side compared to the left. No excoriations.   Assessment & Plan:   See Encounters Tab for problem based charting.   Patient seen with Dr. Angelia Mould

## 2015-08-26 NOTE — Telephone Encounter (Signed)
Pt states she was supposed have a medication sent to pharmacy at her appt on 08/24/15 but nothing at pharmacy

## 2015-08-26 NOTE — Telephone Encounter (Signed)
Have sent dr rivet a note for meds

## 2015-08-26 NOTE — Assessment & Plan Note (Signed)
Pain well controlled with Aleve as needed. She is no longer using Gabapentin.

## 2015-08-31 NOTE — Progress Notes (Signed)
Internal Medicine Clinic Attending  I saw and evaluated the patient.  I personally confirmed the key portions of the history and exam documented by Dr. Rivet and I reviewed pertinent patient test results.  The assessment, diagnosis, and plan were formulated together and I agree with the documentation in the resident's note. 

## 2015-09-20 ENCOUNTER — Emergency Department (HOSPITAL_COMMUNITY)
Admission: EM | Admit: 2015-09-20 | Discharge: 2015-09-20 | Disposition: A | Discharge status: Home / Self Care | Payer: BLUE CROSS/BLUE SHIELD | Attending: Emergency Medicine | Admitting: Emergency Medicine

## 2015-09-20 ENCOUNTER — Encounter (HOSPITAL_COMMUNITY): Payer: Self-pay | Admitting: Nurse Practitioner

## 2015-09-20 DIAGNOSIS — Z96642 Presence of left artificial hip joint: Secondary | ICD-10-CM | POA: Insufficient documentation

## 2015-09-20 DIAGNOSIS — Z7982 Long term (current) use of aspirin: Secondary | ICD-10-CM | POA: Insufficient documentation

## 2015-09-20 DIAGNOSIS — H00015 Hordeolum externum left lower eyelid: Secondary | ICD-10-CM | POA: Diagnosis not present

## 2015-09-20 DIAGNOSIS — Z79899 Other long term (current) drug therapy: Secondary | ICD-10-CM | POA: Diagnosis not present

## 2015-09-20 DIAGNOSIS — I10 Essential (primary) hypertension: Secondary | ICD-10-CM | POA: Diagnosis not present

## 2015-09-20 DIAGNOSIS — H00016 Hordeolum externum left eye, unspecified eyelid: Secondary | ICD-10-CM

## 2015-09-20 DIAGNOSIS — H578 Other specified disorders of eye and adnexa: Secondary | ICD-10-CM | POA: Diagnosis present

## 2015-09-20 DIAGNOSIS — H1013 Acute atopic conjunctivitis, bilateral: Secondary | ICD-10-CM | POA: Diagnosis not present

## 2015-09-20 MED ORDER — ERYTHROMYCIN 5 MG/GM OP OINT: 1.0000 "application " | TOPICAL_OINTMENT | Freq: Four times a day (QID) | OPHTHALMIC | 1 refills | Status: DC

## 2015-09-20 MED ORDER — CETIRIZINE HCL 10 MG PO TABS: 10.0000 mg | ORAL_TABLET | Freq: Every day | ORAL | 1 refills | Status: AC

## 2015-09-20 MED ORDER — FLUTICASONE PROPIONATE 50 MCG/ACT NA SUSP: 2.0000 | Freq: Every day | NASAL | 0 refills | Status: DC

## 2015-09-20 MED ORDER — CLINDAMYCIN HCL 150 MG PO CAPS: 300.0000 mg | ORAL_CAPSULE | Freq: Three times a day (TID) | ORAL | 0 refills | Status: DC

## 2015-09-20 MED ORDER — TETRACAINE HCL 0.5 % OP SOLN
1.0000 [drp] | Freq: Once | OPHTHALMIC | Status: AC
  Administered 2015-09-20: 1 [drp] via OPHTHALMIC
  Filled 2015-09-20: qty 2

## 2015-09-20 MED ORDER — TRAMADOL HCL 50 MG PO TABS: 50.0000 mg | ORAL_TABLET | Freq: Two times a day (BID) | ORAL | 0 refills | Status: DC | PRN

## 2015-09-20 MED ORDER — FLUORESCEIN SODIUM 1 MG OP STRP
1.0000 | ORAL_STRIP | Freq: Once | OPHTHALMIC | Status: AC
  Administered 2015-09-20: 1 via OPHTHALMIC
  Filled 2015-09-20: qty 1

## 2015-09-20 NOTE — ED Triage Notes (Signed)
Pt c/o stye to L eye. She reports itching. Pain, and swelling to the eye. She has been cleaning with peroxide and applying warm compresses with no relief.

## 2015-09-20 NOTE — ED Notes (Signed)
Checked patient eyes with patient glasses patient was 20/30 both eyes 20/30 left eye and 20/30 right eye

## 2015-09-20 NOTE — ED Provider Notes (Signed)
[facility] DEPT Provider Note   CSN: [id number] Arrival date & time: 09/20/15  1109  By signing my name below, I, Dora Sims, attest that this documentation has been prepared under the direction and in the presence of Delsa Grana, PA-C. Electronically Signed: Dora Sims, Scribe. 09/20/2015. 2:26 PM.  History   Chief Complaint Chief Complaint  Patient presents with  . Eye Problem    The history is provided by the patient. No language interpreter was used.     HPI Comments: Briana Ortiz is a 65 y.o. female who presents to the Emergency Department complaining of sudden onset, constant, severe, left eye pain beginning 2 days ago. She endorses associated swelling, itching, and eye watering. Pt notes there is a bump around her left eye and it feels like something is in her left eye. She states she experienced pus-like drainage from her left eye this morning. She also notes blurry vision in her left eye since onset. Pt reports she has been rubbing her left eye often. She has applied warm compresses and ice to her left eye with minimal relief. She states she has never experienced similar symptoms in the past.  She notes no allergies to antibiotics. Pt denies cold-like symptoms, fever, photophobia, or any other associated symptoms.  Past Medical History:  Diagnosis Date  . Arthritis   . GERD (gastroesophageal reflux disease)   . Hypertension   . Uterine fibroid     Patient Active Problem List   Diagnosis Date Noted  . Pruritic rash 08/26/2015  . Chronic back pain 05/27/2014  . Uterine fibroid 01/21/2014  . Health care maintenance 09/12/2013  . Left hand pain 08/15/2012  . Osteoarthritis of left hip 03/14/2011  . Essential hypertension 12/09/2005  . GERD 12/09/2005    Past Surgical History:  Procedure Laterality Date  . HEMORRHOID SURGERY    . TONSILLECTOMY    . 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 TUBAL LIGATION      OB History    Gravida Para Term Preterm AB Living   1 1 1     1    SAB TAB Ectopic Multiple Live Births                   Home Medications    Prior to Admission medications   Medication Sig Start Date End Date Taking? Authorizing Provider  aspirin EC 81 MG tablet Take 81 mg by mouth daily.    Historical Provider, MD  cetirizine (ZYRTEC ALLERGY) 10 MG tablet Take 1 tablet (10 mg total) by mouth daily. 09/20/15   Delsa Grana, PA-C  clindamycin (CLEOCIN) 150 MG capsule Take 2 capsules (300 mg total) by mouth 3 (three) times daily. May dispense as 150mg  capsules 09/20/15   Delsa Grana, PA-C  erythromycin ophthalmic ointment Place 1 application into the left eye every 6 (six) hours. Place 1/2 inch ribbon of ointment in the affected eye 4 times a day 09/20/15   Delsa Grana, PA-C  famotidine (PEPCID) 20 MG tablet Take 1 tablet (20 mg total) by mouth daily. 08/26/15 08/25/16  Carly J Rivet, MD  fluticasone (FLONASE) 50 MCG/ACT nasal spray Place 2 sprays into both nostrils daily. 09/20/15   Delsa Grana, PA-C  gabapentin (NEURONTIN) 600 MG tablet Take 1 tablet (600 mg total) by mouth 2 (two) times daily. 08/26/15   Carly J Rivet,  MD  ibuprofen (ADVIL,MOTRIN) 200 MG tablet Take 200 mg by mouth every 6 (six) hours as needed.    Historical Provider, MD  lisinopril-hydrochlorothiazide (PRINZIDE,ZESTORETIC) 20-25 MG tablet Take 1 tablet by mouth every morning. 08/26/15   Juliet Rude, MD  loratadine (CLARITIN REDITABS) 10 MG dissolvable tablet Take 1 tablet (10 mg total) by mouth every morning. 08/26/15 06/22/18  Juliet Rude, MD  Multiple Vitamin (MULTIVITAMIN WITH MINERALS) TABS Take 1 tablet by mouth daily.    Historical Provider, MD  traMADol (ULTRAM) 50 MG tablet Take 1 tablet (50 mg total) by mouth every 12 (twelve) hours as needed for severe pain. 09/20/15   Delsa Grana, PA-C  triamcinolone cream (KENALOG) 0.1  % Apply 1 application topically 2 (two) times daily. 08/26/15   Juliet Rude, MD    Family History Family History  Problem Relation Age of Onset  . Alcohol abuse Mother   . Breast cancer Neg Hx   . Colon cancer Neg Hx   . Ovarian cancer Neg Hx   . Stroke Neg Hx     Social History Social History  Substance Use Topics  . Smoking status: Never Smoker  . Smokeless tobacco: Never Used  . Alcohol use 0.0 oz/week    5 - 6 Cans of beer per week     Comment: Occasionally.     Allergies   Review of patient's allergies indicates no known allergies.   Review of Systems Review of Systems  All other systems reviewed and are negative.  A complete 10 system review of systems was obtained and all systems are negative except as noted in the HPI and PMH.   Physical Exam Updated Vital Signs BP 196/89 (BP Location: Right Arm)   Pulse 70   Temp 97.7 F (36.5 C) (Oral)   Resp 16   Ht 5' (1.524 m)   Wt 88.9 kg   SpO2 98%   BMI 38.28 kg/m   Physical Exam  Constitutional: She is oriented to person, place, and time. She appears well-developed and well-nourished. No distress.  HENT:  Head: Normocephalic and atraumatic.  Right Ear: External ear normal.  Left Ear: External ear normal.  Nose: Nose normal.  Mouth/Throat: Oropharynx is clear and moist. No oropharyngeal exudate.  Eyes: EOM are normal. Pupils are equal, round, and reactive to light. Left eye exhibits hordeolum. Right conjunctiva is injected. Right conjunctiva has no hemorrhage. Left conjunctiva is injected (mildly injected bilaterally). Left conjunctiva has no hemorrhage. Right eye exhibits normal extraocular motion and no nystagmus. Left eye exhibits normal extraocular motion and no nystagmus.  Left lower lid swelling and redness visible on lash line and also internally.  No active drainage.  Left lower eye lid erythematous with mild swelling   Neck: Neck supple. No tracheal deviation present.  Cardiovascular: Normal rate.     Pulmonary/Chest: Effort normal. No respiratory distress.  Musculoskeletal: Normal range of motion.  Neurological: She is alert and oriented to person, place, and time.  Skin: Skin is warm and dry.  Psychiatric: She has a normal mood and affect. Her behavior is normal.  Nursing note and vitals reviewed.   ED Treatments / Results  Labs (all labs ordered are listed, but only abnormal results are displayed) Labs Reviewed - No data to display  EKG  EKG Interpretation None       Radiology No results found.  Procedures Procedures (including critical care time)  DIAGNOSTIC STUDIES: Oxygen Saturation is 982% on RA, normal by my interpretation.  COORDINATION OF CARE: 2:26 PM Discussed treatment plan with pt at bedside and pt agreed to plan.  Medications Ordered in ED Medications  tetracaine (PONTOCAINE) 0.5 % ophthalmic solution 1 drop (1 drop Both Eyes Given 09/20/15 1405)  fluorescein ophthalmic strip 1 strip (1 strip Both Eyes Given 09/20/15 1405)     Initial Impression / Assessment and Plan / ED Course  I have reviewed the triage vital signs and the nursing notes.  Pertinent labs & imaging results that were available during my care of the patient were reviewed by me and considered in my medical decision making (see chart for details).  Clinical Course    Pt with left stye - looks like internal and external hordeolum, states it began as bilateral eye irritation fand itching.  There is left lower eye lid swelling and erythema will cover with topical and systemic Abx, give ENT follow up.  Will treat allergies with zyrtec and flonase.  Pt afebrile.  No concern for orbital cellulitis, normal EOM's, normal vision.  Final Clinical Impressions(s) / ED Diagnoses   Final diagnoses:  Hordeolum, left  Allergic conjunctivitis, bilateral    New Prescriptions Discharge Medication List as of 09/20/2015  2:51 PM    START taking these medications   Details  cetirizine (ZYRTEC  ALLERGY) 10 MG tablet Take 1 tablet (10 mg total) by mouth daily., Starting Mon 09/20/2015, Print    clindamycin (CLEOCIN) 150 MG capsule Take 2 capsules (300 mg total) by mouth 3 (three) times daily. May dispense as 150mg  capsules, Starting Mon 09/20/2015, Print    erythromycin ophthalmic ointment Place 1 application into the left eye every 6 (six) hours. Place 1/2 inch ribbon of ointment in the affected eye 4 times a day, Starting Mon 09/20/2015, Print    fluticasone (FLONASE) 50 MCG/ACT nasal spray Place 2 sprays into both nostrils daily., Starting Mon 09/20/2015, Print    traMADol (ULTRAM) 50 MG tablet Take 1 tablet (50 mg total) by mouth every 12 (twelve) hours as needed for severe pain., Starting Mon 09/20/2015, Print         Delsa Grana, PA-C 09/22/15 1303    Daleen Bo, MD 09/23/15 [phone]

## 2015-09-20 NOTE — ED Notes (Signed)
Declined W/C at D/C and was escorted to lobby by RN. 

## 2015-09-29 ENCOUNTER — Encounter: Payer: Self-pay | Admitting: Internal Medicine

## 2015-09-29 ENCOUNTER — Ambulatory Visit: Payer: BLUE CROSS/BLUE SHIELD

## 2015-09-30 ENCOUNTER — Ambulatory Visit (INDEPENDENT_AMBULATORY_CARE_PROVIDER_SITE_OTHER): Payer: BLUE CROSS/BLUE SHIELD

## 2015-09-30 ENCOUNTER — Encounter: Payer: Self-pay | Admitting: Podiatry

## 2015-09-30 ENCOUNTER — Ambulatory Visit (INDEPENDENT_AMBULATORY_CARE_PROVIDER_SITE_OTHER): Payer: BLUE CROSS/BLUE SHIELD | Admitting: Podiatry

## 2015-09-30 VITALS — BP 158/80 | HR 68 | Resp 16 | Ht 61.0 in | Wt 195.0 lb

## 2015-09-30 DIAGNOSIS — M79675 Pain in left toe(s): Secondary | ICD-10-CM | POA: Diagnosis not present

## 2015-09-30 DIAGNOSIS — S92912A Unspecified fracture of left toe(s), initial encounter for closed fracture: Secondary | ICD-10-CM | POA: Diagnosis not present

## 2015-09-30 NOTE — Progress Notes (Signed)
   Subjective:    Patient ID: Lenox Ponds, female    DOB: 02/18/1950, 65 y.o.   MRN: [id number]  HPI she presents today with a one-week duration of pain to the fourth and fifth digits of the left foot. She relates stomping her left foot on the floor and then noticed that her fourth and fifth toes were painful.    Review of Systems  All other systems reviewed and are negative.      Objective:   Physical Exam: Vital signs are stable alert and oriented 3. Pulses are palpable. Neurologic sensorium is intact percent lasting monofilament. Deep tendon reflexes are intact. For the visit digits of the left foot are swollen there and good alignment. Radiographs taken today demonstrate fracture of the portion of the fourth and fifth toes. No open lesions.          Assessment & Plan:  Fracture fourth and fifth proximal phalanges left foot.  Plan: Place her in a [employer] shoe for one-month follow-up with me as needed.

## 2015-10-11 ENCOUNTER — Ambulatory Visit: Payer: Self-pay | Admitting: Podiatry

## 2015-11-04 ENCOUNTER — Telehealth: Payer: Self-pay | Admitting: *Deleted

## 2015-11-04 ENCOUNTER — Ambulatory Visit: Payer: BLUE CROSS/BLUE SHIELD | Admitting: Podiatry

## 2015-11-04 NOTE — Telephone Encounter (Signed)
Pt left name and phone number. I called pt she states she had called to cancel today's appt and had rescheduled. I told pt if she would leave me a message often I could call back with the answer, because I have to triage calls by urgency. Pt states understanding and asked what she could take for the pain. I asked pt if she tolerated tylenol or ibuprofen and she states she can.  I told pt to take as the OTC pack instructs for either of them or could alternate. Pt states understanding.

## 2015-11-11 ENCOUNTER — Other Ambulatory Visit: Payer: Self-pay | Admitting: Internal Medicine

## 2015-11-11 DIAGNOSIS — Z1231 Encounter for screening mammogram for malignant neoplasm of breast: Secondary | ICD-10-CM

## 2015-11-17 ENCOUNTER — Telehealth: Payer: Self-pay

## 2015-11-17 NOTE — Telephone Encounter (Addendum)
Pt has h/a for 3 days, across bridge of nose, used her allergy medicine but no relief, she states she is taking all her meds as she has been asked, worried about this h/a, made appt to fit pt's transportation schedule

## 2015-11-17 NOTE — Telephone Encounter (Signed)
Needs to speak with a nurse regarding personal problem.  

## 2015-11-17 NOTE — Telephone Encounter (Signed)
Called pt lm for rtc

## 2015-11-18 ENCOUNTER — Ambulatory Visit: Payer: BLUE CROSS/BLUE SHIELD | Admitting: Podiatry

## 2015-11-18 ENCOUNTER — Telehealth: Payer: Self-pay | Admitting: Internal Medicine

## 2015-11-18 NOTE — Telephone Encounter (Signed)
APT. REMINDER CALL, PHONE NOT IN SERVICE °

## 2015-11-19 ENCOUNTER — Encounter: Payer: Self-pay | Admitting: Internal Medicine

## 2015-11-19 ENCOUNTER — Ambulatory Visit: Payer: BLUE CROSS/BLUE SHIELD

## 2015-11-22 ENCOUNTER — Ambulatory Visit
Admission: RE | Admit: 2015-11-22 | Discharge: 2015-11-22 | Disposition: A | Discharge status: Home / Self Care | Payer: BLUE CROSS/BLUE SHIELD | Source: Ambulatory Visit | Attending: Internal Medicine | Admitting: Internal Medicine

## 2015-11-22 ENCOUNTER — Ambulatory Visit: Payer: BLUE CROSS/BLUE SHIELD

## 2015-11-22 DIAGNOSIS — Z1231 Encounter for screening mammogram for malignant neoplasm of breast: Secondary | ICD-10-CM

## 2015-12-02 ENCOUNTER — Ambulatory Visit: Payer: BLUE CROSS/BLUE SHIELD | Admitting: Podiatry

## 2016-01-10 ENCOUNTER — Ambulatory Visit (INDEPENDENT_AMBULATORY_CARE_PROVIDER_SITE_OTHER): Payer: BLUE CROSS/BLUE SHIELD

## 2016-01-10 ENCOUNTER — Ambulatory Visit (INDEPENDENT_AMBULATORY_CARE_PROVIDER_SITE_OTHER): Payer: BLUE CROSS/BLUE SHIELD | Admitting: Family Medicine

## 2016-01-10 VITALS — BP 140/88 | HR 71 | Temp 97.7°F | Resp 17 | Ht 61.0 in | Wt 200.0 lb

## 2016-01-10 DIAGNOSIS — M25511 Pain in right shoulder: Secondary | ICD-10-CM | POA: Diagnosis not present

## 2016-01-10 DIAGNOSIS — M79601 Pain in right arm: Secondary | ICD-10-CM

## 2016-01-10 MED ORDER — TRAMADOL HCL 50 MG PO TABS: 50.0000 mg | ORAL_TABLET | Freq: Four times a day (QID) | ORAL | 0 refills | Status: DC | PRN

## 2016-01-10 MED ORDER — GABAPENTIN 600 MG PO TABS: 300.0000 mg | ORAL_TABLET | Freq: Two times a day (BID) | ORAL | 0 refills | Status: DC | PRN

## 2016-01-10 NOTE — Patient Instructions (Addendum)
Your arm pain may be due to a pinched nerve coming from your neck. Gabapentin 1/2-1 pill twice per day as needed, and tramadol if needed for breakthrough pain. Follow-up in 4 days for recheck, sooner if worse.   Shoulder Pain Many things can cause shoulder pain, including:  An injury to the area.  Overuse of the shoulder.  Arthritis. The source of the pain can be:  Inflammation.  An injury to the shoulder joint.  An injury to a tendon, ligament, or bone. Follow these instructions at home: Take these actions to help with your pain:  Squeeze a soft ball or a foam pad as much as possible. This helps to keep the shoulder from swelling. It also helps to strengthen the arm.  Take over-the-counter and prescription medicines only as told by your health care provider.  If directed, apply ice to the area:  Put ice in a plastic bag.  Place a towel between your skin and the bag.  Leave the ice on for 20 minutes, 2-3 times per day. Stop applying ice if it does not help with the pain.  If you were given a shoulder sling or immobilizer:  Wear it as told.  Remove it to shower or bathe.  Move your arm as little as possible, but keep your hand moving to prevent swelling. Contact a health care provider if:  Your pain gets worse.  Your pain is not relieved with medicines.  New pain develops in your arm, hand, or fingers. Get help right away if:  Your arm, hand, or fingers:  Tingle.  Become numb.  Become swollen.  Become painful.  Turn white or blue. This information is not intended to replace advice given to you by your health care provider. Make sure you discuss any questions you have with your health care provider. Document Released: 10/26/2004 Document Revised: 09/12/2015 Document Reviewed: 05/11/2014 Elsevier Interactive Patient Education  2017 [employer]  Cervical Radiculopathy Introduction Cervical radiculopathy happens when a nerve in the neck (cervical nerve)  is pinched or bruised. This condition can develop because of an injury or as part of the normal aging process. Pressure on the cervical nerves can cause pain or numbness that runs from the neck all the way down into the arm and fingers. Usually, this condition gets better with rest. Treatment may be needed if the condition does not improve. What are the causes? This condition may be caused by:  Injury.  Slipped (herniated) disk.  Muscle tightness in the neck because of overuse.  Arthritis.  Breakdown or degeneration in the bones and joints of the spine (spondylosis) due to aging.  Bone spurs that may develop near the cervical nerves. What are the signs or symptoms? Symptoms of this condition include:  Pain that runs from the neck to the arm and hand. The pain can be severe or irritating. It may be worse when the neck is moved.  Numbness or weakness in the affected arm and hand. How is this diagnosed? This condition may be diagnosed based on symptoms, medical history, and a physical exam. You may also have tests, including:  X-rays.  CT scan.  MRI.  Electromyogram (EMG).  Nerve conduction tests. How is this treated? In many cases, treatment is not needed for this condition. With rest, the condition usually gets better over time. If treatment is needed, options may include:  Wearing a soft neck collar for short periods of time.  Physical therapy to strengthen your neck muscles.  Medicines, such as  NSAIDs, oral corticosteroids, or spinal injections.  Surgery. This may be needed if other treatments do not help. Various types of surgery may be done depending on the cause of your problems. Follow these instructions at home: Managing pain  Take over-the-counter and prescription medicines only as told by your health care provider.  If directed, apply ice to the affected area.  Put ice in a plastic bag.  Place a towel between your skin and the bag.  Leave the ice on for  20 minutes, 2-3 times per day.  If ice does not help, you can try using heat. Take a warm shower or warm bath, or use a heat pack as told by your health care provider.  Try a gentle neck and shoulder massage to help relieve symptoms. Activity  Rest as needed. Follow instructions from your health care provider about any restrictions on activities.  Do stretching and strengthening exercises as told by your health care provider or physical therapist. General instructions  If you were given a soft collar, wear it as told by your health care provider.  Use a flat pillow when you sleep.  Keep all follow-up visits as told by your health care provider. This is important. Contact a health care provider if:  Your condition does not improve with treatment. Get help right away if:  Your pain gets much worse and cannot be controlled with medicines.  You have weakness or numbness in your hand, arm, face, or leg.  You have a high fever.  You have a stiff, rigid neck.  You lose control of your bowels or your bladder (have incontinence).  You have trouble with walking, balance, or speaking. This information is not intended to replace advice given to you by your health care provider. Make sure you discuss any questions you have with your health care provider. Document Released: 10/11/2000 Document Revised: 06/24/2015 Document Reviewed: 03/12/2014  2017 Elsevier   IF you received an x-ray today, you will receive an invoice from [facility]. Please contact [facility] at [phone] with questions or concerns regarding your invoice.   IF you received labwork today, you will receive an invoice from [employer]. Please contact [facility] at [phone] with questions or concerns regarding your invoice.   Our billing staff will not be able to assist you with questions regarding bills from these companies.  You will be contacted with the lab results as  soon as they are available. The fastest way to get your results is to activate your My Chart account. Instructions are located on the last page of this paperwork. If you have not heard from [place] regarding the results in 2 weeks, please contact this office.

## 2016-01-10 NOTE — Progress Notes (Signed)
Subjective:  By signing my name below, I, Briana Ortiz, attest that this documentation has been prepared under the direction and in the presence of Briana Ray, MD.  Electronically Signed: [name], ED Scribe. 01/10/2016. 3:50 PM.   Patient ID: Briana Ortiz, female    DOB: April 20, 1950, 65 y.o.   MRN: [id number]  HPI Chief Complaint  Patient presents with  . Shoulder Pain    Right. NKI. Started Friday   . Sinus Problem    PT INFORMED OF 1 ISSUE PER VISIT POLICY     HPI Comments: LLADIRA Ortiz is a 65 y.o. female who presents to the [facility] complaining of right shoulder pain that began 4 days ago. She denies known injury.  Pain shoots down entire right arm but locates pain primarily to middle of upper arm described as stabbing. She's tried 2 motrin every 4 hours starting 3 days ago as well as heat, ice and epson salt. No hx of shoulder or neck pain. She denies fever, chills, weight loss. Pt denies hx of blood clots. Pt is right hand dominant.   Pt had been on gabapentin for sciatic but stopped taking about 1 month. Pt states this medication makes her drowsy.  She restarted gabapentin 3 days ago. She also took tramadol once for the past days.  Patient Active Problem List   Diagnosis Date Noted  . Pruritic rash 08/26/2015  . Chronic back pain 05/27/2014  . Uterine fibroid 01/21/2014  . Health care maintenance 09/12/2013  . Left hand pain 08/15/2012  . Osteoarthritis of left hip 03/14/2011  . Essential hypertension 12/09/2005  . GERD 12/09/2005   Past Medical History:  Diagnosis Date  . Arthritis   . GERD (gastroesophageal reflux disease)   . Hypertension   . Uterine fibroid    Past Surgical History:  Procedure Laterality Date  . HEMORRHOID SURGERY    . TONSILLECTOMY    . 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 TUBAL LIGATION     No Known Allergies Prior to Admission medications   Medication Sig Start Date End Date Taking? Authorizing Provider  aspirin EC 81 MG tablet Take 81 mg by mouth daily.   Yes Historical Provider, MD  cetirizine (ZYRTEC ALLERGY) 10 MG tablet Take 1 tablet (10 mg total) by mouth daily. 09/20/15  Yes Delsa Grana, PA-C  clindamycin (CLEOCIN) 150 MG capsule Take 2 capsules (300 mg total) by mouth 3 (three) times daily. May dispense as 150mg  capsules 09/20/15  Yes Delsa Grana, PA-C  erythromycin ophthalmic ointment Place 1 application into the left eye every 6 (six) hours. Place 1/2 inch ribbon of ointment in the affected eye 4 times a day 09/20/15  Yes Delsa Grana, PA-C  famotidine (PEPCID) 20 MG tablet Take 1 tablet (20 mg total) by mouth daily. 08/26/15 08/25/16 Yes Carly J Rivet, MD  fluticasone (FLONASE) 50 MCG/ACT nasal spray Place 2 sprays into both nostrils daily. 09/20/15  Yes Delsa Grana, PA-C  gabapentin (NEURONTIN) 600 MG tablet Take 1 tablet (600 mg total) by mouth 2 (two) times daily. 08/26/15  Yes Carly Montey Hora, MD  ibuprofen (ADVIL,MOTRIN) 200 MG tablet Take 200 mg by mouth every 6 (six) hours as needed.   Yes Historical Provider, MD  lisinopril-hydrochlorothiazide (PRINZIDE,ZESTORETIC) 20-25 MG tablet Take 1 tablet by mouth every morning. 08/26/15  Yes  Juliet Rude, MD  loratadine (CLARITIN REDITABS) 10 MG dissolvable tablet Take 1 tablet (10 mg total) by mouth every morning. 08/26/15 06/22/18 Yes Carly Montey Hora, MD  Multiple Vitamin (MULTIVITAMIN WITH MINERALS) TABS Take 1 tablet by mouth daily.   Yes Historical Provider, MD  traMADol (ULTRAM) 50 MG tablet Take 1 tablet (50 mg total) by mouth every 12 (twelve) hours as needed for severe pain. 09/20/15  Yes Delsa Grana, PA-C  triamcinolone cream (KENALOG) 0.1 % Apply 1 application topically 2 (two) times daily. 08/26/15  Yes Juliet Rude, MD   Social History   Social History  . Marital status: Single    Spouse name:  N/A  . Number of children: N/A  . Years of education: N/A   Occupational History  . Not on file.   Social History Main Topics  . Smoking status: Never Smoker  . Smokeless tobacco: Never Used  . Alcohol use 0.0 oz/week    5 - 6 Cans of beer per week     Comment: Occasionally.  . Drug use: No  . Sexual activity: Yes    Birth control/ protection: None   Other Topics Concern  . Not on file   Social History Narrative   Financial assistance approved for 100% discount at [facility] and has [facility] card   [facility]  December 30, 2009 2:50 PM   Review of Systems  Constitutional: Negative for chills, fever and unexpected weight change.  Musculoskeletal: Positive for myalgias. Negative for arthralgias, joint swelling, neck pain and neck stiffness.  Skin: Negative for color change and wound.  Neurological: Negative for weakness and numbness.     Objective:   Physical Exam  Constitutional: She is oriented to person, place, and time. She appears well-developed and well-nourished. No distress.  HENT:  Head: Normocephalic and atraumatic.  Eyes: Conjunctivae and EOM are normal.  Neck: Neck supple.  Cardiovascular: Normal rate.   Pulmonary/Chest: Effort normal.  Musculoskeletal: Normal range of motion.  No focal bony tenderness to right hand or wrist. Pain free elbow ROM. Tender along mid upper arm, lateral aspect. Skin intact. No apparent skin changes, no redness or rash. Arm circumference is equal to left side. Pain in right trapezius. Ortiz amount spasm, right paraspinal and trapezius. Diffuse tenderness entire right shoulder.   Neurological: She is alert and oriented to person, place, and time.  Skin: Skin is warm and dry.  Psychiatric: She has a normal mood and affect. Her behavior is normal.  Nursing note and vitals reviewed.   Vitals:   01/10/16 1506  BP: 140/88  Pulse: 71  Resp: 17  Temp: 97.7 F (36.5 C)  TempSrc: Oral  SpO2: 97%  Weight: 200 lb (90.7 kg)  Height: 5\' 1"   (1.549 m)    Assessment & Plan:    Briana Ortiz is a 65 y.o. female Pain in joint of right shoulder - Plan: DG Shoulder Right, gabapentin (NEURONTIN) 600 MG tablet, traMADol (ULTRAM) 50 MG tablet  Right arm pain - Plan: DG Shoulder Right, DG Humerus Right, gabapentin (NEURONTIN) 600 MG tablet, traMADol (ULTRAM) 50 MG tablet   right upper arm pain, shoulder pain, diffuse, without known injury. Skin intact, no sign of infection, no red flags on history. Some discomfort in upper trapezius and with neck range of motion, possible cervical radiculopathy versus impingement/rotator cuff syndrome.  -Continue gabapentin 300-600 mg twice a day when necessary, side effects discussed. Tramadol given for short course if needed. SED. Recheck in 4  days, sooner if worse.  Meds ordered this encounter  Medications  . gabapentin (NEURONTIN) 600 MG tablet    Sig: Take 0.5-1 tablets (300-600 mg total) by mouth 2 (two) times daily as needed.    Dispense:  60 tablet    Refill:  0  . traMADol (ULTRAM) 50 MG tablet    Sig: Take 1 tablet (50 mg total) by mouth every 6 (six) hours as needed for severe pain.    Dispense:  6 tablet    Refill:  0   Patient Instructions   Your arm pain may be due to a pinched nerve coming from your neck. Gabapentin 1/2-1 pill twice per day as needed, and tramadol if needed for breakthrough pain. Follow-up in 4 days for recheck, sooner if worse.   Shoulder Pain Many things can cause shoulder pain, including:  An injury to the area.  Overuse of the shoulder.  Arthritis. The source of the pain can be:  Inflammation.  An injury to the shoulder joint.  An injury to a tendon, ligament, or bone. Follow these instructions at home: Take these actions to help with your pain:  Squeeze a soft ball or a foam pad as much as possible. This helps to keep the shoulder from swelling. It also helps to strengthen the arm.  Take over-the-counter and prescription medicines only as told  by your health care provider.  If directed, apply ice to the area:  Put ice in a plastic bag.  Place a towel between your skin and the bag.  Leave the ice on for 20 minutes, 2-3 times per day. Stop applying ice if it does not help with the pain.  If you were given a shoulder sling or immobilizer:  Wear it as told.  Remove it to shower or bathe.  Move your arm as little as possible, but keep your hand moving to prevent swelling. Contact a health care provider if:  Your pain gets worse.  Your pain is not relieved with medicines.  New pain develops in your arm, hand, or fingers. Get help right away if:  Your arm, hand, or fingers:  Tingle.  Become numb.  Become swollen.  Become painful.  Turn white or blue. This information is not intended to replace advice given to you by your health care provider. Make sure you discuss any questions you have with your health care provider. Document Released: 10/26/2004 Document Revised: 09/12/2015 Document Reviewed: 05/11/2014 Elsevier Interactive Patient Education  2017 Elsevier Inc.  Cervical Radiculopathy Introduction Cervical radiculopathy happens when a nerve in the neck (cervical nerve) is pinched or bruised. This condition can develop because of an injury or as part of the normal aging process. Pressure on the cervical nerves can cause pain or numbness that runs from the neck all the way down into the arm and fingers. Usually, this condition gets better with rest. Treatment may be needed if the condition does not improve. What are the causes? This condition may be caused by:  Injury.  Slipped (herniated) disk.  Muscle tightness in the neck because of overuse.  Arthritis.  Breakdown or degeneration in the bones and joints of the spine (spondylosis) due to aging.  Bone spurs that may develop near the cervical nerves. What are the signs or symptoms? Symptoms of this condition include:  Pain that runs from the neck to  the arm and hand. The pain can be severe or irritating. It may be worse when the neck is moved.  Numbness or weakness  in the affected arm and hand. How is this diagnosed? This condition may be diagnosed based on symptoms, medical history, and a physical exam. You may also have tests, including:  X-rays.  CT scan.  MRI.  Electromyogram (EMG).  Nerve conduction tests. How is this treated? In many cases, treatment is not needed for this condition. With rest, the condition usually gets better over time. If treatment is needed, options may include:  Wearing a soft neck collar for short periods of time.  Physical therapy to strengthen your neck muscles.  Medicines, such as NSAIDs, oral corticosteroids, or spinal injections.  Surgery. This may be needed if other treatments do not help. Various types of surgery may be done depending on the cause of your problems. Follow these instructions at home: Managing pain  Take over-the-counter and prescription medicines only as told by your health care provider.  If directed, apply ice to the affected area.  Put ice in a plastic bag.  Place a towel between your skin and the bag.  Leave the ice on for 20 minutes, 2-3 times per day.  If ice does not help, you can try using heat. Take a warm shower or warm bath, or use a heat pack as told by your health care provider.  Try a gentle neck and shoulder massage to help relieve symptoms. Activity  Rest as needed. Follow instructions from your health care provider about any restrictions on activities.  Do stretching and strengthening exercises as told by your health care provider or physical therapist. General instructions  If you were given a soft collar, wear it as told by your health care provider.  Use a flat pillow when you sleep.  Keep all follow-up visits as told by your health care provider. This is important. Contact a health care provider if:  Your condition does not improve  with treatment. Get help right away if:  Your pain gets much worse and cannot be controlled with medicines.  You have weakness or numbness in your hand, arm, face, or leg.  You have a high fever.  You have a stiff, rigid neck.  You lose control of your bowels or your bladder (have incontinence).  You have trouble with walking, balance, or speaking. This information is not intended to replace advice given to you by your health care provider. Make sure you discuss any questions you have with your health care provider. Document Released: 10/11/2000 Document Revised: 06/24/2015 Document Reviewed: 03/12/2014  2017 Elsevier   IF you received an x-Ortiz today, you will receive an invoice from [facility]. Please contact [facility] at [phone] with questions or concerns regarding your invoice.   IF you received labwork today, you will receive an invoice from [employer]. Please contact [facility] at [phone] with questions or concerns regarding your invoice.   Our billing staff will not be able to assist you with questions regarding bills from these companies.  You will be contacted with the lab results as soon as they are available. The fastest way to get your results is to activate your My Chart account. Instructions are located on the last page of this paperwork. If you have not heard from [place] regarding the results in 2 weeks, please contact this office.        I personally performed the services described in this documentation, which was scribed in my presence. The recorded information has been reviewed and considered, and addended by me as needed.   Signed,   Dellis Filbert  Carlota Raspberry, MD [facility].  01/10/16 4:31 PM

## 2016-01-12 ENCOUNTER — Encounter (HOSPITAL_COMMUNITY): Payer: Self-pay | Admitting: Emergency Medicine

## 2016-01-12 ENCOUNTER — Emergency Department (HOSPITAL_COMMUNITY)
Admission: EM | Admit: 2016-01-12 | Discharge: 2016-01-12 | Disposition: A | Discharge status: Home / Self Care | Payer: BLUE CROSS/BLUE SHIELD | Attending: Emergency Medicine | Admitting: Emergency Medicine

## 2016-01-12 DIAGNOSIS — M542 Cervicalgia: Secondary | ICD-10-CM | POA: Diagnosis present

## 2016-01-12 DIAGNOSIS — M5412 Radiculopathy, cervical region: Secondary | ICD-10-CM | POA: Diagnosis not present

## 2016-01-12 DIAGNOSIS — Z7982 Long term (current) use of aspirin: Secondary | ICD-10-CM | POA: Insufficient documentation

## 2016-01-12 DIAGNOSIS — Z96641 Presence of right artificial hip joint: Secondary | ICD-10-CM | POA: Insufficient documentation

## 2016-01-12 DIAGNOSIS — I10 Essential (primary) hypertension: Secondary | ICD-10-CM | POA: Diagnosis not present

## 2016-01-12 MED ORDER — PREDNISONE 10 MG PO TABS: ORAL_TABLET | ORAL | 0 refills | Status: DC

## 2016-01-12 MED ORDER — OXYCODONE-ACETAMINOPHEN 5-325 MG PO TABS: 1.0000 | ORAL_TABLET | ORAL | 0 refills | Status: DC | PRN

## 2016-01-12 MED ORDER — OXYCODONE-ACETAMINOPHEN 5-325 MG PO TABS
1.0000 | ORAL_TABLET | Freq: Once | ORAL | Status: AC
  Administered 2016-01-12: 1 via ORAL
  Filled 2016-01-12: qty 1

## 2016-01-12 NOTE — ED Triage Notes (Signed)
Pt. reports persistent right upper arm muscle pain for several days unrelieved by Tramadol and Gabapentin prescribed by Dr. Carlota Raspberry at [facility] urgent Care 2 days ago .

## 2016-01-12 NOTE — ED Provider Notes (Signed)
[place] DEPT Provider Note   CSN: [id number] Arrival date & time: 01/12/16  0219     History   Chief Complaint Chief Complaint  Patient presents with  . Arm Pain    HPI Briana Ortiz is a 65 y.o. female.  Patient with a history of HTN, GERD presents with complaint of sharp, intermittent and stabbing pain in her right arm. She reports pain that starts in the neck and radiates down medial arm and into 1st and 2nd fingers. No swelling, injury, weakness or numbness. She was seen at Urgent Care yesterday for same and was started on gabapentin and tramadol for radicular pain. She has an appointment with her primary care physician tomorrow but comes in tonight for uncontrolled pain.   The history is provided by the patient. No language interpreter was used.  Arm Pain  Pertinent negatives include no chest pain, no abdominal pain and no shortness of breath.    Past Medical History:  Diagnosis Date  . Arthritis   . GERD (gastroesophageal reflux disease)   . Hypertension   . Uterine fibroid     Patient Active Problem List   Diagnosis Date Noted  . Pruritic rash 08/26/2015  . Chronic back pain 05/27/2014  . Uterine fibroid 01/21/2014  . Health care maintenance 09/12/2013  . Left hand pain 08/15/2012  . Osteoarthritis of left hip 03/14/2011  . Essential hypertension 12/09/2005  . GERD 12/09/2005    Past Surgical History:  Procedure Laterality Date  . HEMORRHOID SURGERY    . TONSILLECTOMY    . 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 TUBAL LIGATION      OB History    Gravida Para Term Preterm AB Living   1 1 1     1    SAB TAB Ectopic Multiple Live Births                   Home Medications    Prior to Admission medications   Medication Sig Start Date End Date Taking? Authorizing Provider  aspirin EC 81 MG tablet  Take 81 mg by mouth daily.   Yes Historical Provider, MD  cetirizine (ZYRTEC ALLERGY) 10 MG tablet Take 1 tablet (10 mg total) by mouth daily. 09/20/15  Yes Delsa Grana, PA-C  erythromycin ophthalmic ointment Place 1 application into the left eye every 6 (six) hours. Place 1/2 inch ribbon of ointment in the affected eye 4 times a day 09/20/15  Yes Delsa Grana, PA-C  famotidine (PEPCID) 20 MG tablet Take 1 tablet (20 mg total) by mouth daily. 08/26/15 08/25/16 Yes Carly J Rivet, MD  fluticasone (FLONASE) 50 MCG/ACT nasal spray Place 2 sprays into both nostrils daily. Patient taking differently: Place 2 sprays into both nostrils daily as needed for allergies.  09/20/15  Yes Delsa Grana, PA-C  gabapentin (NEURONTIN) 600 MG tablet Take 0.5-1 tablets (300-600 mg total) by mouth 2 (two) times daily as needed. Patient taking differently: Take 300-600 mg by mouth 2 (two) times daily as needed (pain).  01/10/16  Yes Wendie Agreste, MD  ibuprofen (ADVIL,MOTRIN) 200 MG tablet Take 200-400 mg by mouth every 6 (six) hours as needed for moderate pain.    Yes Historical Provider, MD  lisinopril-hydrochlorothiazide (PRINZIDE,ZESTORETIC) 20-25 MG tablet Take 1 tablet by mouth every morning. 08/26/15  Yes Carly Montey Hora, MD  loratadine (CLARITIN REDITABS)  10 MG dissolvable tablet Take 1 tablet (10 mg total) by mouth every morning. 08/26/15 06/22/18 Yes Carly Montey Hora, MD  Multiple Vitamin (MULTIVITAMIN WITH MINERALS) TABS Take 1 tablet by mouth daily.   Yes Historical Provider, MD  traMADol (ULTRAM) 50 MG tablet Take 1 tablet (50 mg total) by mouth every 6 (six) hours as needed for severe pain. 01/10/16  Yes Wendie Agreste, MD  triamcinolone cream (KENALOG) 0.1 % Apply 1 application topically 2 (two) times daily. 08/26/15  Yes Juliet Rude, MD    Family History Family History  Problem Relation Age of Onset  . Alcohol abuse Mother   . Breast cancer Neg Hx   . Colon cancer Neg Hx   . Ovarian cancer Neg Hx   . Stroke  Neg Hx     Social History Social History  Substance Use Topics  . Smoking status: Never Smoker  . Smokeless tobacco: Never Used  . Alcohol use 0.0 oz/week    5 - 6 Cans of beer per week     Comment: Occasionally.     Allergies   Patient has no known allergies.   Review of Systems Review of Systems  Constitutional: Negative for fever.  Respiratory: Negative.  Negative for shortness of breath.   Cardiovascular: Negative.  Negative for chest pain.  Gastrointestinal: Negative.  Negative for abdominal pain and nausea.  Musculoskeletal:       See HPI.  Skin: Negative.  Negative for color change and wound.  Neurological: Negative.  Negative for weakness and numbness.     Physical Exam Updated Vital Signs BP 146/76   Pulse (!) 57   Temp 98.3 F (36.8 C) (Oral)   Resp 18   Ht 5\' 1"  (1.549 m)   Wt 90.7 kg   SpO2 98%   BMI 37.79 kg/m   Physical Exam  Constitutional: She is oriented to person, place, and time. She appears well-developed and well-nourished. No distress.  Cardiovascular: Intact distal pulses.   Pulmonary/Chest: Effort normal.  Abdominal: Soft. There is no tenderness.  Musculoskeletal: Normal range of motion.  Tender to right lateral neck that radiates into the arm in the pattern of complaint. Equal grip strength in bilateral UE's. No swelling.   Neurological: She is alert and oriented to person, place, and time. No sensory deficit.     ED Treatments / Results  Labs (all labs ordered are listed, but only abnormal results are displayed) Labs Reviewed - No data to display  EKG  EKG Interpretation None       Radiology Dg Shoulder Right  Result Date: 01/10/2016 CLINICAL DATA:  Right shoulder pain EXAM: RIGHT SHOULDER - 2+ VIEW COMPARISON:  None. FINDINGS: There is no evidence of fracture or dislocation. There is no evidence of arthropathy or other focal bone abnormality. Soft tissues are unremarkable. IMPRESSION: Negative. Electronically Signed    By: Donavan Foil M.D.   On: 01/10/2016 16:20   Dg Humerus Right  Result Date: 01/10/2016 CLINICAL DATA:  Pain EXAM: RIGHT HUMERUS - 2+ VIEW COMPARISON:  None. FINDINGS: There is no evidence of fracture or other focal bone lesions. Soft tissues are unremarkable. IMPRESSION: Negative. Electronically Signed   By: Donavan Foil M.D.   On: 01/10/2016 16:21    Procedures Procedures (including critical care time)  Medications Ordered in ED Medications  oxyCODONE-acetaminophen (PERCOCET/ROXICET) 5-325 MG per tablet 1 tablet (not administered)     Initial Impression / Assessment and Plan / ED Course  I have reviewed  the triage vital signs and the nursing notes.  Pertinent labs & imaging results that were available during my care of the patient were reviewed by me and considered in my medical decision making (see chart for details).  Clinical Course     Patient with right lateral neck pain radiating in to the UE c/w cervical radiculopathy without neurologic deficit. Pain uncontrolled with tramadol. She can be discharged home with already scheduled follow up with PCP tomorrow for further outpatient evaluation and management.   Final Clinical Impressions(s) / ED Diagnoses   Final diagnoses:  None   1. Cervical radiculopathy  New Prescriptions New Prescriptions   No medications on file     Charlann Lange, PA-C 01/12/16 [place], MD 01/12/16 [phone]

## 2016-01-13 ENCOUNTER — Ambulatory Visit (INDEPENDENT_AMBULATORY_CARE_PROVIDER_SITE_OTHER): Payer: BLUE CROSS/BLUE SHIELD | Admitting: Internal Medicine

## 2016-01-13 ENCOUNTER — Encounter: Payer: Self-pay | Admitting: Internal Medicine

## 2016-01-13 DIAGNOSIS — M7711 Lateral epicondylitis, right elbow: Secondary | ICD-10-CM

## 2016-01-13 DIAGNOSIS — M25521 Pain in right elbow: Secondary | ICD-10-CM

## 2016-01-13 DIAGNOSIS — M25511 Pain in right shoulder: Secondary | ICD-10-CM | POA: Diagnosis not present

## 2016-01-13 DIAGNOSIS — M7520 Bicipital tendinitis, unspecified shoulder: Secondary | ICD-10-CM | POA: Insufficient documentation

## 2016-01-13 NOTE — Assessment & Plan Note (Signed)
Symptoms began about 1 week ago at her right shoulder with radiation down her arm to her elbow. Pain is intermittent with sharp, stabbing, "knife-like" sensation most notable at the middle of her lateral upper arm. She denies any numbness/tinglin or "pins and needles" sensation. She denies any injury to the area. 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 She was seen at Urgent Care 3 days ago at which time xrays of the right shoulder and humerus were negative. She was instructed to continue her home Gabapentin and given a short course of Tramadol. She was seen in the ED yesterday and thought to have cervical radiculopathy. She was instructed to stop Tramadol and prescribed Percocet for pain as well as a Prednisone taper. Patient reports Gabapentin has made her drowsy during the day. She takes Advil with some relief. She took the Percocet for severe pain with relief. She denies any focal weakness, swelling, or erythema.  Symptoms and exam consistent with lateral epicondylitis likely from overuse activity with work. She does not describe typical numbness/tingling that would suggest cervical radiculopathy, although this is still in the differential. We will treat her with NSAIDs and conservative treatment. Do not think she needs to take the Prednisone. I advised her to discontinue the Tramadol and use her Percocet only as needed for severe pain and cautioned her on side effects. She is to avoid operating motor vehicles if she uses the Percocet. She will also try Gabapentin at nighttime only to avoid daytime drowsiness which she has previously taken for her chronic back pain. She understands and agrees to the plan. -Ibuprofen 400 mg q6h prn -Gabapentin qhs prn; avoid during daytime -Can take previously prescribed Percocet only for severe pain -Hold off on Prednisone -call if symptoms worsen or do not improve in 2 weeks

## 2016-01-13 NOTE — Patient Instructions (Signed)
It was a pleasure to meet you Briana Ortiz.  It sounds like your right shoulder and elbow pain is from tendinitis from overuse.  Treatment is with non-steroidal anti-inflammatory medicines like Ibuprofen or Advil.  You can take Ibuprofen 400 mg every 6 hours as needed to help reduce inflammation.  Rest and ice or heat will also help.  Please stop the Tramadol. Only take the Percocet for very severe pain. Do not operate motor vehicles when taking the percocet.  You can try the Gabapentin at bedtime to avoid daytime drowsiness.  I do not think you need to take the Prednisone.  Please let us know if your pain worsens or does not improve in the next 2-3 weeks.    Tennis Elbow Tennis elbow (lateral epicondylitis) is inflammation of the outer tendons of your forearm close to your elbow. Your tendons attach your muscles to your bones. The outer tendons of your forearm are used to extend your wrist, and they attach on the outside part of your elbow. Tennis elbow is often found in people who play tennis, but anyone may get the condition from repeatedly extending the wrist or turning the forearm. What are the causes? This condition is caused by repeatedly extending your wrist and using your hands. It can result from sports or work that requires repetitive forearm movements. Tennis elbow may also be caused by an injury. What increases the risk? You have a higher risk of developing tennis elbow if you play tennis or another racquet sport. You also have a higher risk if you frequently use your hands for work. This condition is also more likely to develop in:  Musicians.  Carpenters, painters, and plumbers.  Cooks.  Cashiers.  People who work in [employer].  Architect workers.  Butchers.  People who use computers. What are the signs or symptoms? Symptoms of this condition include:  Pain and tenderness in your forearm and the outer part of your elbow. You may only feel the pain when you  use your arm, or you may feel it even when you are not using your arm.  A burning feeling that runs from your elbow through your arm.  Weak grip in your hands. How is this diagnosed? This condition may be diagnosed by medical history and physical exam. You may also have other tests, including:  X-rays.  MRI. How is this treated? Your health care provider will recommend lifestyle adjustments, such as resting and icing your arm. Treatment may also include:  Medicines for inflammation. This may include shots of cortisone if your pain continues.  Physical therapy. This may include massage or exercises.  An elbow brace. Surgery may eventually be recommended if your pain does not go away with treatment. Follow these instructions at home: Activity  Rest your elbow and wrist as directed by your health care provider. Try to avoid any activities that caused the problem until your health care provider says that you can do them again.  If a physical therapist teaches you exercises, do all of them as directed.  If you lift an object, lift it with your palm facing upward. This lowers the stress on your elbow. Lifestyle  If your tennis elbow is caused by sports, check your equipment and make sure that:  You are using it correctly.  It is the best fit for you.  If your tennis elbow is caused by work, take breaks frequently, if you are able. Talk with your manager about how to best perform tasks in a  way that is safe.  If your tennis elbow is caused by computer use, talk with your manager about any changes that can be made to your work environment. General instructions  If directed, apply ice to the painful area:  Put ice in a plastic bag.  Place a towel between your skin and the bag.  Leave the ice on for 20 minutes, 2-3 times per day.  Take medicines only as directed by your health care provider.  If you were given a brace, wear it as directed by your health care provider.  Keep  all follow-up visits as directed by your health care provider. This is important. Contact a health care provider if:  Your pain does not get better with treatment.  Your pain gets worse.  You have numbness or weakness in your forearm, hand, or fingers. This information is not intended to replace advice given to you by your health care provider. Make sure you discuss any questions you have with your health care provider. Document Released: 01/16/2005 Document Revised: 09/16/2015 Document Reviewed: 01/12/2014 Elsevier Interactive Patient Education  2017 [employer].

## 2016-01-13 NOTE — Progress Notes (Signed)
   CC: Right arm pain  HPI:  Briana Ortiz is a 65 y.o. female with PMH as listed below who presents for evaluation of right arm pain.  Right arm pain: Symptoms began about 1 week ago at her right shoulder with radiation down her arm to her elbow. Pain is intermittent with sharp, stabbing, "knife-like" sensation most notable at the middle of her lateral upper arm. She denies any numbness/tinglin or "pins and needles" sensation. She denies any injury to the area. 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 She was seen at Urgent Care 3 days ago at which time xrays of the right shoulder and humerus were negative. She was instructed to continue her home Gabapentin and given a short course of Tramadol. She was seen in the ED yesterday and thought to have cervical radiculopathy. She was instructed to stop Tramadol and prescribed Percocet for pain as well as a Prednisone taper. Patient reports Gabapentin has made her drowsy during the day. She takes Advil with some relief. She took the Percocet for severe pain with relief. She denies any focal weakness, swelling, or erythema.  Past Medical History:  Diagnosis Date  . Arthritis   . GERD (gastroesophageal reflux disease)   . Hypertension   . Uterine fibroid     Review of Systems:   Review of Systems  Constitutional: Negative for chills, diaphoresis and fever.  Respiratory: Negative for shortness of breath.   Cardiovascular: Negative for chest pain.  Musculoskeletal:       Right shoulder and arm pain  Skin: Negative for rash.  Neurological: Negative for tingling, sensory change and focal weakness.     Physical Exam:  Vitals:   01/13/16 1019  BP: 126/85  Pulse: 65  Temp: 97.9 F (36.6 C)  TempSrc: Oral  SpO2: 99%  Weight: 202 lb (91.6 kg)  Height: 5\' 1"  (1.549 m)   Physical Exam  Constitutional: She is oriented to person, place, and time. She appears well-developed and well-nourished. No  distress.  Musculoskeletal: Normal range of motion. She exhibits no edema.  Right shoulder mildly tender to palpation over proximal deltoid, pain reproduced with palpation over right lateral epicondyle and passive movement in abduction and adduction. Strength 5/5 b/l upper extremities. ROM intact. No swelling, erythema, or increased warmth to touch.  Neurological: She is alert and oriented to person, place, and time.  Skin: Skin is warm. She is not diaphoretic. No erythema.    Assessment & Plan:   See Encounters Tab for problem based charting.  Patient discussed with Dr. Beryle Beams

## 2016-01-13 NOTE — Progress Notes (Signed)
Medicine attending: Medical history, presenting problems, physical findings, and medications, reviewed with resident physician Dr Vishal Patel on the day of the patient visit and I concur with his evaluation and management plan. 

## 2016-01-18 ENCOUNTER — Telehealth: Payer: Self-pay

## 2016-01-18 NOTE — Telephone Encounter (Signed)
Needs to speak with a nurse regarding right arm pain.

## 2016-01-27 NOTE — Telephone Encounter (Signed)
appt for recheck 1/3

## 2016-02-01 ENCOUNTER — Telehealth: Payer: Self-pay | Admitting: Internal Medicine

## 2016-02-01 NOTE — Telephone Encounter (Signed)
Apt. Reminder call, lmtcb °

## 2016-02-02 ENCOUNTER — Ambulatory Visit: Payer: BLUE CROSS/BLUE SHIELD

## 2016-03-20 ENCOUNTER — Ambulatory Visit: Payer: BLUE CROSS/BLUE SHIELD

## 2016-03-22 ENCOUNTER — Encounter: Payer: Self-pay | Admitting: Internal Medicine

## 2016-03-22 ENCOUNTER — Ambulatory Visit: Payer: BLUE CROSS/BLUE SHIELD

## 2016-03-29 ENCOUNTER — Encounter: Payer: Self-pay | Admitting: Internal Medicine

## 2016-03-29 ENCOUNTER — Other Ambulatory Visit: Payer: Self-pay | Admitting: Internal Medicine

## 2016-03-29 MED ORDER — LISINOPRIL-HYDROCHLOROTHIAZIDE 20-25 MG PO TABS: 1.0000 | ORAL_TABLET | Freq: Every morning | ORAL | 1 refills | Status: DC

## 2016-04-13 ENCOUNTER — Other Ambulatory Visit: Payer: Self-pay

## 2016-04-13 NOTE — Telephone Encounter (Signed)
lisinopril-hydrochlorothiazide (PRINZIDE,ZESTORETIC) 20-25 MG tablet, Refill request @ [facility] on [address].

## 2016-04-13 NOTE — Telephone Encounter (Signed)
Pharm was notified, they rec'd script 2/28 and it was placed on file, they are getting it ready and she may pick up in appr 1-2 hrs, she was made aware

## 2016-04-20 ENCOUNTER — Ambulatory Visit: Payer: BLUE CROSS/BLUE SHIELD | Admitting: Podiatrist

## 2016-04-25 ENCOUNTER — Telehealth: Payer: Self-pay | Admitting: Internal Medicine

## 2016-04-25 NOTE — Telephone Encounter (Signed)
APT. REMINDER CALL, LMTCB °

## 2016-04-26 ENCOUNTER — Encounter (INDEPENDENT_AMBULATORY_CARE_PROVIDER_SITE_OTHER): Payer: Self-pay

## 2016-04-26 ENCOUNTER — Encounter: Payer: Self-pay | Admitting: Internal Medicine

## 2016-04-26 ENCOUNTER — Ambulatory Visit (INDEPENDENT_AMBULATORY_CARE_PROVIDER_SITE_OTHER): Payer: BLUE CROSS/BLUE SHIELD | Admitting: Internal Medicine

## 2016-04-26 VITALS — BP 147/92 | HR 64 | Temp 98.2°F | Ht 61.0 in | Wt 197.9 lb

## 2016-04-26 DIAGNOSIS — M7521 Bicipital tendinitis, right shoulder: Secondary | ICD-10-CM | POA: Diagnosis not present

## 2016-04-26 MED ORDER — DICLOFENAC SODIUM 1 % TD GEL: 4.0000 g | Freq: Four times a day (QID) | TRANSDERMAL | Status: DC | PRN

## 2016-04-26 MED ORDER — DICLOFENAC SODIUM 1 % TD GEL: 4.0000 g | Freq: Four times a day (QID) | TRANSDERMAL | 1 refills | Status: DC | PRN

## 2016-04-26 NOTE — Progress Notes (Signed)
   CC: Right arm pain  HPI:  [name] is a 66 y.o. female with past medical history outlined below here for right arm pain. For the details of today's visit, please refer to the assessment and plan.  Past Medical History:  Diagnosis Date  . Arthritis   . GERD (gastroesophageal reflux disease)   . Hypertension   . Uterine fibroid     Review of Systems:  All pertinents listed in HPI, otherwise negative  Physical Exam:  Vitals:   04/26/16 0852  BP: (!) 147/92  Pulse: 64  Temp: 98.2 F (36.8 C)  TempSrc: Oral  SpO2: 100%  Weight: 197 lb 14.4 oz (89.8 kg)  Height: 5\' 1"  (1.549 m)    Constitutional: NAD, appears comfortable Cardiovascular: RRR Pulmonary/Chest: CTAB Extremities: Right arm with tenderness to palpation over the long head of the biceps tendon. +Yergason test. Upper extremity strength intact. Neurological: A&Ox3, CN II - XII grossly intact.    Assessment & Plan:   See Encounters Tab for problem based charting.  Patient seen with Dr. Evette Doffing

## 2016-04-26 NOTE — Patient Instructions (Signed)
[name],  For your arm pain, you may take 800 mg of ibuprofen/advil up to three times daily for the next week. You may also take tylenol 1 g up to three times daily if you need additional pain relief. I have also given you a prescription for a topical pain medication called voltaren gel. You may apply this to the affected area up to 4 times daily as needed for pain. If you change your mind about the steroid injection, please make another appointment for follow up. We can also refer you to physical therapy for strengthening exercises. If you have any questions or concerns, call our clinic at [phone] or after hours call [phone] and ask for the internal medicine resident on call. Thank you!  - Dr. Philipp Ovens

## 2016-04-26 NOTE — Assessment & Plan Note (Signed)
Patient endorses intermittent pain in her right upper extremity ongoing since December. Patient has been taking ibuprofen 200 mg when necessary without relief. On exam she has tenderness to palpation of the long head of the right biceps tendon. She also has pain with supination against resistance. Strength is intact. Point-of-care ultrasound revealed an intact but inflamed biceps tendon with surrounding pocket of fluid. Treatment options were discussed with patient. She has declined steroid injection today, but will consider in the future if pain persist. Advised patient to take ibuprofen 800 mg 3 times a day and Tylenol as needed for pain. Topical diclofenac gel was prescribed for additional relief. We also discussed physical therapy referral future once pain is improved.  -- Ibuprofen 800 mg TID  -- Tylenol prn for additional pain relief  -- Topical Voltaren gel QID prn  -- Return to clinic if pain persist for possible steroid injection

## 2016-04-27 ENCOUNTER — Telehealth: Payer: Self-pay | Admitting: *Deleted

## 2016-04-27 NOTE — Telephone Encounter (Signed)
Pt states she needs PA on voltaren gel, could we get started she is very anxious, thanks

## 2016-04-27 NOTE — Progress Notes (Signed)
Internal Medicine Clinic Attending  I saw and evaluated the patient.  I personally confirmed the key portions of the history and exam documented by Dr. Philipp Ovens and I reviewed pertinent patient test results.  The assessment, diagnosis, and plan were formulated together and I agree with the documentation in the resident's note.  Persistent right arm pain over the long head of the biceps without weakness. She has pain with palpation over the biceps tendon. Pain with resisted supination, but no weakness, so seems to represent biceps tendonitis. We did a POC ultrasound which showed a thickened biceps tendon with moderate associated fluid collection, likely representing partial tear from chronic tendinopathy. We talked about the natural course of this, and about conservative management. I did offer her steroid injection to the fluid collection, which I think we can accomplish under US guidance. This would have high chance of improving pain, but also increased chance of progressing partial tear to full thickness. She understands, will consider, and follow up.   Sagittal view of the anterior shoulder showing thickened LHBT with associated fluid collection.

## 2016-04-28 ENCOUNTER — Encounter: Payer: BLUE CROSS/BLUE SHIELD | Admitting: Podiatry

## 2016-04-28 ENCOUNTER — Ambulatory Visit: Payer: BLUE CROSS/BLUE SHIELD

## 2016-04-28 DIAGNOSIS — M25571 Pain in right ankle and joints of right foot: Secondary | ICD-10-CM

## 2016-05-01 ENCOUNTER — Telehealth: Payer: Self-pay | Admitting: Internal Medicine

## 2016-05-01 NOTE — Telephone Encounter (Signed)
Needs to schedule steroid injection.

## 2016-05-03 NOTE — Progress Notes (Signed)
This encounter was created in error - please disregard.

## 2016-05-05 ENCOUNTER — Ambulatory Visit (INDEPENDENT_AMBULATORY_CARE_PROVIDER_SITE_OTHER): Payer: BLUE CROSS/BLUE SHIELD | Admitting: Internal Medicine

## 2016-05-05 DIAGNOSIS — M25411 Effusion, right shoulder: Secondary | ICD-10-CM

## 2016-05-05 DIAGNOSIS — M7521 Bicipital tendinitis, right shoulder: Secondary | ICD-10-CM

## 2016-05-05 NOTE — Progress Notes (Signed)
   CC: Right shoulder and arm pain  HPI:  [name] is a 66 y.o. female with a past medical history listed below here today for follow up of her right shoulder and arm pain.  For details of today's visit and the status of her chronic medical issues please refer to the assessment and plan.   Past Medical History:  Diagnosis Date  . Arthritis   . GERD (gastroesophageal reflux disease)   . Hypertension   . Uterine fibroid     Review of Systems:   See HPI  Physical Exam:  Vitals:   05/05/16 0914  BP: 130/82  Pulse: (!) 57  Temp: 98 F (36.7 C)  TempSrc: Oral  SpO2: 100%  Weight: 197 lb 6.4 oz (89.5 kg)  Height: 5\' 1"  (1.549 m)   Physical Exam  Constitutional: She is well-developed, well-nourished, and in no distress. No distress.  Cardiovascular: Normal rate and regular rhythm.   Pulmonary/Chest: Effort normal and breath sounds normal.  Musculoskeletal:  Right arm with point tenderness over anterior surface of right shoulder and along the long head of the biceps tendon. Strength and sensation normal.        Assessment & Plan:   See Encounters Tab for problem based charting.  Patient seen with Dr. Angelia Mould

## 2016-05-05 NOTE — Patient Instructions (Signed)
[name],  It was a pleasure meeting you today. We did a steroid injection in your right shoulder. You can expect to be a little sore in that area tonight but the steroid should start to take effect in the next day or two. If you have any redness, warmth, fever, chills, worsening pain please give the clinic a call.    Shoulder Injection A shoulder injection is a procedure to get medicine into your shoulder joint. Your health care provider puts a needle into the joint and injects medicine with an attached syringe. The injected medicine may relieve the pain, swelling, and stiffness of arthritis. The injected medicine may also help to lubricate and cushion your shoulder joint. You may need more than one injection. Tell a health care provider about:  Any allergies you have.  All medicines you are taking, including vitamins, herbs, eye drops, creams, and over-the-counter medicines.  Any problems you or family members have had with anesthetic medicines.  Any blood disorders you have.  Any surgeries you have had.  Any medical conditions you have. What are the risks? Generally, this is a safe procedure. However, problems may occur, including:  Infection.  Bleeding.  Worsening symptoms.  Damage to the area around your knee.  Allergic reaction to any of the medicines.  Skin reactions from repeated injections. What happens before the procedure?  Ask your health care provider about changing or stopping your regular medicines. This is especially important if you are taking diabetes medicines or blood thinners.  Plan to have someone take you home after the procedure.  What happens after the procedure?  You may have to move your shoulder through its full range of motion. This helps to get all of the medicine into your joint space.  Your blood pressure, heart rate, breathing rate, and blood oxygen level will be monitored often until the medicines you were given have worn off.  You will  be watched to make sure that you do not have a reaction to the injected medicine. This information is not intended to replace advice given to you by your health care provider. Make sure you discuss any questions you have with your health care provider. Document Released: 04/09/2006 Document Revised: 06/18/2015 Document Reviewed: 11/26/2013 Elsevier Interactive Patient Education  2017 [employer].

## 2016-05-05 NOTE — Assessment & Plan Note (Signed)
[name] presents today for follow up of her right biceps tendonitis. She has had no relief with ibuprofen or tylenol. Was unable to get the Voltaren gel as her insurance would not cover it. Requests to precede with steroid injection today.   Point of care ultrasound was done again today which showed an intact but inflamed biceps tendon. No surrounding pocket of fluid visualized today.   10 mg Kenolog 2 mL lidoca  Shoulder Injection Procedure Note  Pre-operative Diagnosis: right biceps tendonitis  Indications: Symptomatic relief of large effusion  Anesthesia: Lidocaine 2% without epinephrine without added sodium bicarbonate  Procedure Details   Point of care ultrasound was used to identify the subacromial bursa and evaluate for rotator cuff tendinopathy or tears. Consent was obtained for the procedure. The shoulder was prepped with iodine. Using a 22 gauge needle the subacromial bursa was injected with 2 mL 2% lidocaine and 40 mg of triamcinolone (KENALOG) 40mg /ml. Injection was directly visualized with ultrasound. The needle was removed and a dressing was applied.   Complications:  None; patient tolerated the procedure well.

## 2016-05-09 NOTE — Progress Notes (Signed)
Internal Medicine Clinic Attending  I saw and evaluated the patient.  I personally confirmed the key portions of the history and exam documented by Dr. Charlynn Grimes and I reviewed pertinent patient test results.  The assessment, diagnosis, and plan were formulated together and I agree with the documentation in the resident's note. We used POCUS which compared with her last U/S the size of the effusion around the bicepis tendon appeared larger.  At this point she wanted to try a steroid injection.  Therefore we used U/S to guide our injection into the area.  She tolerated the procedure well. To correct Dr Shanna [address] procedure note only .25 cc of kenalog was used which is 10mg  of triamcinolone.

## 2016-05-18 ENCOUNTER — Telehealth: Payer: Self-pay | Admitting: *Deleted

## 2016-05-18 NOTE — Telephone Encounter (Signed)
Information was sent to CoverMyMeds for PA for Diclofenac Gel.  Approved 05/18/2016 thru 01/29/2018.  Sander Nephew, RN 05/18/2016 5:00 PM

## 2016-05-25 ENCOUNTER — Telehealth: Payer: Self-pay

## 2016-05-25 NOTE — Telephone Encounter (Signed)
Need to speak with a nurse about fluid on the right knee. Please call back.

## 2016-05-26 ENCOUNTER — Encounter (INDEPENDENT_AMBULATORY_CARE_PROVIDER_SITE_OTHER): Payer: Self-pay

## 2016-05-26 ENCOUNTER — Ambulatory Visit (INDEPENDENT_AMBULATORY_CARE_PROVIDER_SITE_OTHER): Payer: BLUE CROSS/BLUE SHIELD | Admitting: Internal Medicine

## 2016-05-26 VITALS — BP 140/73 | HR 62 | Temp 98.2°F | Ht 61.0 in | Wt 197.2 lb

## 2016-05-26 DIAGNOSIS — M7989 Other specified soft tissue disorders: Secondary | ICD-10-CM | POA: Diagnosis not present

## 2016-05-26 DIAGNOSIS — M21062 Valgus deformity, not elsewhere classified, left knee: Secondary | ICD-10-CM

## 2016-05-26 DIAGNOSIS — M25561 Pain in right knee: Secondary | ICD-10-CM

## 2016-05-26 DIAGNOSIS — M21061 Valgus deformity, not elsewhere classified, right knee: Secondary | ICD-10-CM | POA: Diagnosis not present

## 2016-05-26 DIAGNOSIS — E669 Obesity, unspecified: Secondary | ICD-10-CM

## 2016-05-26 DIAGNOSIS — M25061 Hemarthrosis, right knee: Secondary | ICD-10-CM | POA: Insufficient documentation

## 2016-05-26 LAB — SYNOVIAL CELL COUNT + DIFF, W/ CRYSTALS
Crystals, Fluid: NONE SEEN
EOSINOPHILS-SYNOVIAL: 0 % (ref 0–1)
Lymphocytes-Synovial Fld: 54 % — ABNORMAL HIGH (ref 0–20)
Monocyte-Macrophage-Synovial Fluid: 7 % — ABNORMAL LOW (ref 50–90)
Neutrophil, Synovial: 39 % — ABNORMAL HIGH (ref 0–25)
OTHER CELLS-SYN: 0
WBC, Synovial: 400 /mm3 — ABNORMAL HIGH (ref 0–200)

## 2016-05-26 NOTE — Telephone Encounter (Signed)
Lm for rtc 

## 2016-05-26 NOTE — Assessment & Plan Note (Signed)
Patient developed acute onset right knee pain and swelling without any associated trauma. Given she heard a "pop" sound there is concern for possible ligament tear or meniscus tear. No tears were appreciated on ultrasound evaluation. She had a significant amount of dark reddish fluid that was aspirated that is consistent with hemarthrosis. Fluid was sent for cell count, culture, and crystals. Patient states she is planning on following up with her Orthopedic surgeon soon and will call this coming Monday (4/30) for an appointment. She was advised to stop her aspirin for the next 2 weeks. Hemarthrosis can be seen with traumatic injuries, but usually in people with predisposition to bleeding such as being on anticoagulants or hemophilia. Patient is only on a baby aspirin, not full anticoagulation and denies any trauma to the knee. Possible that she could have an acquired hemophilia. Will follow up on synovial fluid studies and recommendations from Orthopedic surgery.

## 2016-05-26 NOTE — Progress Notes (Signed)
   CC: Right knee pain  HPI:  [name] is a 66 y.o. woman with PMHx as noted below who presents today for evaluation of acute right knee pain.  Patient reports 3 days ago she was walking and suddenly developed acute onset right knee pain. She reports hearing a "pop" and shortly after had pain. She describes her right knee became very swollen over the next few days and the pain worsened. She denies any redness of the knee. She has never had any prior swollen joints. She denies any falls or trauma to the knee. She has never had any knee surgeries. She denies any fevers.  Past Medical History:  Diagnosis Date  . Arthritis   . GERD (gastroesophageal reflux disease)   . Hypertension   . Uterine fibroid     Review of Systems:   All negative except per HPI  Physical Exam:  Vitals:   05/26/16 1508  BP: 140/73  Pulse: 62  Temp: 98.2 F (36.8 C)  SpO2: 100%  Weight: 197 lb 3.2 oz (89.4 kg)  Height: 5\' 1"  (1.549 m)   General: Obese woman, cooperative with exam Ext: Right knee appears significantly swollen in the suprapatellar bursa. Tenderness is elicited with palpation of the area and when patient bends knee. There is no erythema present. Lachman and anterior drawer tests negative. Valgus and varus stress tests negative. Both lower extremities have valgus deformity.  Procedure Note Procedure: Right knee aspiration with ultrasound guidance Indications: Diagnostic and therapeutic (pain relief)  Patient was informed of risks and consent was obtained. A time out was performed.   Patient was prepped and draped appropriately. Chlorhexidine was used to clean the area. From a lateral approach with the patient's leg extended, an 18 gauge needle was used to insert ~2 cc of lidocaine. Then, another 18 gauge needle was used to aspirate approximately 60 cc of reddish black fluid.   Patient tolerated procedure well with no apparent complications.  Dr. Evette Doffing was present and supervised  the entire procedure.   Assessment & Plan:   See Encounters Tab for problem based charting.  Patient seen with Dr. Evette Doffing

## 2016-05-26 NOTE — Telephone Encounter (Signed)
Pt states R knee has been continuously getting worse since mon, swelling, wm to touch, painful. She is given appt for 1445 today but does not know if she will have transportation available

## 2016-05-26 NOTE — Patient Instructions (Signed)
General Instructions: - Hold your baby aspirin for 2 weeks - Call and make an appointment with your orthopedic doctor - Will call you with results. May take a few days for results to come back.   Please bring your medicines with you each time you come to clinic.  Medicines may include prescription medications, over-the-counter medications, herbal remedies, eye drops, vitamins, or other pills.   Progress Toward Treatment Goals:  Treatment Goal 11/18/2014  Blood pressure at goal  Prevent falls -    Self Care Goals & Plans:  Self Care Goal 11/18/2014  Manage my medications take my medicines as prescribed; bring my medications to every visit; refill my medications on time  Monitor my health -  Eat healthy foods eat more vegetables; eat foods that are low in salt; eat baked foods instead of fried foods  Be physically active find an activity I enjoy  Meeting treatment goals -    No flowsheet data found.   Care Management & Community Referrals:  Referral 05/27/2014  Referrals made for care management support none needed  Referrals made to community resources none

## 2016-05-26 NOTE — Telephone Encounter (Signed)
Agree 

## 2016-05-29 NOTE — Progress Notes (Signed)
Internal Medicine Clinic Attending  I saw and evaluated the patient.  I personally confirmed the key portions of the history and exam documented by Dr. Arcelia Jew and I reviewed pertinent patient test results.  The assessment, diagnosis, and plan were formulated together and I agree with the documentation in the resident's note. I was present for the entirety of the procedure.   To clarify Dr. Shela Commons note: we anesthetized the lateral knee with lidocaine through a 25 gauge needle.   Patient tells me that she was walking normally when she felt a pop in her right knee and had a sudden sensation of pain. She had to sit down and reported the pain lasted for several hours. Later on she noticed increasing swelling of the knee and had chronic discomfort with walking. Denies any falls. No other trauma or twisting. She reports that she is able to bear weight now, denies locking or instability of the knee. Denies any history of ligament injuries. No prior surgeries. On my exam the MCL and LCL are intact with no joint laxity when I apply varus and valgus pressure. Anterior and posterior drawer tests are normal, Lockman test is without laxity as well. There is no joint line tenderness to palpation. There was a very large bulging effusion in the suprapatellar pouch. There was pain with significant flexion which makes me think meniscal injury. As Dr. Arcelia Jew noted, she had a large effusion on ultrasound which we aspirated which was grossly bloody, this was charcoal colored consistent with a hemarthrosis that had been there for several days. I think given the sensation of a pop and then sudden onset of pain, most likely this was due to knee injury. Probably degenerative meniscal tear given her age. Without any trauma I doubt fractures were don't think we need x-ray of the knee. I doubt this was a spontaneous hemarthrosis due to hemophilia because she has no history of bleeding, and this was not spontaneous and asymptomatic.

## 2016-05-30 LAB — BODY FLUID CULTURE: Culture: NO GROWTH

## 2016-06-12 ENCOUNTER — Telehealth: Payer: Self-pay | Admitting: Student in an Organized Health Care Education/Training Program

## 2016-06-12 NOTE — Telephone Encounter (Signed)
I called Briana Ortiz to follow up on her right knee pain. She had a sudden onset right knee effusion with pain in late April. We did an arthrocentesis and drainage a large amount of blood consistent with a spontaneous hemarthrosis. She reports that the knee pain is resolved, no further swelling. Walking and feeling well. Has an appointment with orthopedics for next week.

## 2016-07-11 ENCOUNTER — Encounter: Payer: Self-pay | Admitting: *Deleted

## 2016-07-11 ENCOUNTER — Encounter: Payer: Self-pay | Admitting: Internal Medicine

## 2016-07-11 ENCOUNTER — Encounter: Payer: BLUE CROSS/BLUE SHIELD | Admitting: Internal Medicine

## 2016-07-24 ENCOUNTER — Ambulatory Visit (INDEPENDENT_AMBULATORY_CARE_PROVIDER_SITE_OTHER): Payer: BLUE CROSS/BLUE SHIELD | Admitting: Internal Medicine

## 2016-07-24 VITALS — BP 132/82 | HR 64 | Temp 97.8°F | Ht 61.0 in | Wt 197.2 lb

## 2016-07-24 DIAGNOSIS — J019 Acute sinusitis, unspecified: Principal | ICD-10-CM

## 2016-07-24 DIAGNOSIS — B9789 Other viral agents as the cause of diseases classified elsewhere: Secondary | ICD-10-CM | POA: Insufficient documentation

## 2016-07-24 DIAGNOSIS — J018 Other acute sinusitis: Secondary | ICD-10-CM

## 2016-07-24 DIAGNOSIS — L609 Nail disorder, unspecified: Secondary | ICD-10-CM | POA: Insufficient documentation

## 2016-07-24 MED ORDER — FLUTICASONE PROPIONATE 50 MCG/ACT NA SUSP: 2.0000 | Freq: Every day | NASAL | 0 refills | Status: AC

## 2016-07-24 NOTE — Patient Instructions (Addendum)
Ms. Fetherolf it was nice meeting you today.  -You may take over-the-counter Tylenol as needed for sinus pain/headaches  -Do saline nasal irrigation   -Use Flonase nasal spray as instructed  -Return to the clinic if your symptoms do not improve in the next 7-10 days

## 2016-07-24 NOTE — Assessment & Plan Note (Signed)
History of present illness Patient is presenting with a 3 day history of sinus pain/pressure, clear nasal secretions, and frontal headaches. Also reports having a nonproductive cough and body aches. Denies having any fevers, chills, sneezing, sore throat, or shortness of breath. She is a never smoker. Denies having any GERD symptoms.  Assessment Acute viral rhinosinusitis. Sinuses tender on palpation. Cough likely secondary to postnasal drip.  Plan -Symptomatic management: Over-the-counter Tylenol as needed for headaches, sinus pain Saline nasal irrigation Intranasal glucocorticoid-sample of Flonase has been given to the patient -Advised patient to return to the clinic if her symptoms do not improve in the next 7-10 days

## 2016-07-24 NOTE — Assessment & Plan Note (Addendum)
History of present illness Patient believes her second right toe nail is coming off. Denies having any pain in the area.  Assessment Toenails appeared long and were covered with nail polish. Right second toenail was long but not detached; no signs of onychomycosis/ infection. No history of diabetes.  Plan -Advised patient to clip her toenails

## 2016-07-24 NOTE — Progress Notes (Signed)
   CC: Patient is complaining of sinus pressure and headaches. Also complaining of her right second toenail coming off.  HPI:  [name] is a 66 y.o. female with a past medical history of conditions listed below presenting to the clinic complaining of sinus pressure and headaches. She is also complaining of her right second toenail coming off. Please see problem based charting for the status of the patient's current and chronic medical conditions.   Past Medical History:  Diagnosis Date  . Arthritis   . GERD (gastroesophageal reflux disease)   . Hypertension   . Uterine fibroid     Review of Systems: Pertinent positives mentioned in HPI. Remainder of all ROS negative.   Physical Exam:  Vitals:   07/24/16 0946  BP: 132/82  Pulse: 64  Temp: 97.8 F (36.6 C)  TempSrc: Oral  SpO2: 100%  Weight: 197 lb 3.2 oz (89.4 kg)  Height: 5\' 1"  (1.549 m)   Physical Exam  Constitutional: She is oriented to person, place, and time. She appears well-developed and well-nourished. No distress.  HENT:  Head: Normocephalic and atraumatic.  Frontal, ethmoidal, and maxillary sinuses tender to palpation bilaterally.  Eyes: Right eye exhibits no discharge. Left eye exhibits no discharge.  Cardiovascular: Normal rate, regular rhythm and intact distal pulses.   Pulmonary/Chest: Effort normal and breath sounds normal. No respiratory distress. She has no wheezes. She has no rales.  Abdominal: Soft. Bowel sounds are normal. She exhibits no distension. There is no tenderness.  Musculoskeletal: She exhibits no edema.  Lymphadenopathy:    She has no cervical adenopathy.  Neurological: She is alert and oriented to person, place, and time.  Skin: Skin is warm and dry.  Right foot: Toenails are long and covered with nail polish. Right second toe nail long but intact; no onychomycosis. Toenail is not detached. No erythema, increased warmth, or swelling in the area.    Assessment & Plan:   See  Encounters Tab for problem based charting.  Patient discussed with Dr. Daryll Drown

## 2016-07-25 NOTE — Progress Notes (Signed)
Internal Medicine Clinic Attending  Case discussed with Dr. Rathoreat the time of the visit. We reviewed the resident's history and exam and pertinent patient test results. I agree with the assessment, diagnosis, and plan of care documented in the resident's note.  

## 2016-07-25 NOTE — Addendum Note (Signed)
Addended by: Gilles Chiquito B on: 07/25/2016 04:25 PM   Modules accepted: Level of Service

## 2016-09-28 ENCOUNTER — Ambulatory Visit: Payer: BLUE CROSS/BLUE SHIELD

## 2016-09-28 ENCOUNTER — Encounter (INDEPENDENT_AMBULATORY_CARE_PROVIDER_SITE_OTHER): Payer: BLUE CROSS/BLUE SHIELD | Admitting: Podiatry

## 2016-09-28 DIAGNOSIS — M7751 Other enthesopathy of right foot: Secondary | ICD-10-CM

## 2016-09-28 NOTE — Progress Notes (Signed)
This encounter was created in error - please disregard.

## 2016-10-30 ENCOUNTER — Other Ambulatory Visit: Payer: Self-pay | Admitting: Internal Medicine

## 2016-10-30 MED ORDER — LISINOPRIL-HYDROCHLOROTHIAZIDE 20-25 MG PO TABS: 1.0000 | ORAL_TABLET | Freq: Every morning | ORAL | 2 refills | Status: DC

## 2016-10-30 NOTE — Telephone Encounter (Signed)
Lisinopril-HCTZ rx called to [facility] on [address] per pt's request.

## 2016-10-30 NOTE — Telephone Encounter (Signed)
Patient requesting refill on meds, pls call back

## 2016-10-31 ENCOUNTER — Telehealth: Payer: Self-pay

## 2016-10-31 NOTE — Telephone Encounter (Signed)
Needs to speak with a nurse about headache. Please call pt back.

## 2016-10-31 NOTE — Telephone Encounter (Signed)
States she continues c/o sinus h/a's -tried Motrin which irritated her stomach. Pain is left side of nose. Suggested Flonase, Loratadine or Zyrtec (on  Med list). Said Flonase OTC is too expensive-she does not ave the money right now. But she will try Zyrtec (suggested store brand) and if it's not better by Thursday, she will call back.

## 2016-11-14 ENCOUNTER — Other Ambulatory Visit: Payer: Self-pay | Admitting: Internal Medicine

## 2016-11-14 DIAGNOSIS — Z1231 Encounter for screening mammogram for malignant neoplasm of breast: Secondary | ICD-10-CM

## 2016-12-04 ENCOUNTER — Ambulatory Visit
Admission: RE | Admit: 2016-12-04 | Discharge: 2016-12-04 | Disposition: A | Discharge status: Home / Self Care | Payer: BLUE CROSS/BLUE SHIELD | Source: Ambulatory Visit | Attending: Chiropractic Medicine | Admitting: Chiropractic Medicine

## 2016-12-04 DIAGNOSIS — Z1231 Encounter for screening mammogram for malignant neoplasm of breast: Secondary | ICD-10-CM

## 2017-03-02 ENCOUNTER — Ambulatory Visit (INDEPENDENT_AMBULATORY_CARE_PROVIDER_SITE_OTHER): Payer: Self-pay | Admitting: Internal Medicine

## 2017-03-02 ENCOUNTER — Ambulatory Visit (HOSPITAL_COMMUNITY)
Admission: RE | Admit: 2017-03-02 | Discharge: 2017-03-02 | Disposition: A | Discharge status: Home / Self Care | Payer: Self-pay | Source: Ambulatory Visit | Attending: Internal Medicine | Admitting: Internal Medicine

## 2017-03-02 ENCOUNTER — Encounter: Payer: Self-pay | Admitting: Internal Medicine

## 2017-03-02 ENCOUNTER — Other Ambulatory Visit: Payer: Self-pay

## 2017-03-02 VITALS — BP 163/93 | HR 53 | Temp 97.9°F | Ht 62.0 in | Wt 203.6 lb

## 2017-03-02 DIAGNOSIS — M25461 Effusion, right knee: Secondary | ICD-10-CM

## 2017-03-02 DIAGNOSIS — R001 Bradycardia, unspecified: Secondary | ICD-10-CM

## 2017-03-02 DIAGNOSIS — M25061 Hemarthrosis, right knee: Secondary | ICD-10-CM

## 2017-03-02 LAB — BODY FLUID CELL COUNT WITH DIFFERENTIAL

## 2017-03-02 LAB — APTT: APTT: 27 s (ref 24–36)

## 2017-03-02 LAB — SYNOVIAL CELL COUNT + DIFF, W/ CRYSTALS
CRYSTALS FLUID: NONE SEEN
Eosinophils-Synovial: 3 % — ABNORMAL HIGH (ref 0–1)
Lymphocytes-Synovial Fld: 12 % (ref 0–20)
Monocyte-Macrophage-Synovial Fluid: 2 % — ABNORMAL LOW (ref 50–90)
Neutrophil, Synovial: 83 % — ABNORMAL HIGH (ref 0–25)
WBC, Synovial: 1238 /mm3 — ABNORMAL HIGH (ref 0–200)

## 2017-03-02 LAB — PROTIME-INR
INR: 0.93
PROTHROMBIN TIME: 12.4 s (ref 11.4–15.2)

## 2017-03-02 NOTE — Addendum Note (Signed)
Addended by: Meryl Dare on: 03/02/2017 07:21 PM   Modules accepted: Level of Service

## 2017-03-02 NOTE — Progress Notes (Addendum)
CC: follow up of right knee effusion   HPI:  Briana Ortiz is a 67 y.o. with PMH as listed below who presents for acute concern of right knee swelling. Please see the assessment and plans for the status of the patient chronic medical problems.    Past Medical History:  Diagnosis Date  . Arthritis   . GERD (gastroesophageal reflux disease)   . Hypertension   . Uterine fibroid    Review of Systems:  Refer to history of present illness and assessment and plans for pertinent review of systems, all others reviewed and negative  Physical Exam:  Vitals:   03/02/17 1452  BP: (!) 163/93  Pulse: (!) 53  Temp: 97.9 F (36.6 C)  TempSrc: Oral  SpO2: 100%  Weight: 203 lb 9.6 oz (92.4 kg)  Height: 5\' 2"  (1.575 m)   General: well appearing, no acute distress  MSK: large effusion of the suprapatellar bursa, no overlying erythema or warmth, there is tenderness to palpation over the effusion and exquisite tenderness over palpation of the joint line, knees feel large and bony, crepitus is illicited with flexion and extension of the knee, there is normal joint laxity with valgus and varus testing, McMurrays testing does produce pain   Assessment & Plan:   Knee pain  Visited clinic 04/2016 with complaints of sudden onset right knee pain proceeded by a "pop" and followed by swelling. She had a large bulging suprapatellar effusion which was tender to palpation and aspiration of the bursa revealed 60 cc of reddish black fluid. This was though to be consistent with meniscal injury ( given pain with flexion of the knee) and spontaneous hemarthrosis. She was told to stop takign aspirin and advised to schedule an appointment with her orthopedic surgeon. When called about two weeks after the procedure her knee pain and swelling had resolved.  On Monday of this week she began having swelling in the right knee associated with pain. She denies traumatic injury to the knee or a pop associated with the  start of the swelling but she does endorse a sensation of locking of the knee at times. She denies fever or chills.  Exam is concerning for meniscal injury. 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 She denies any personal or family history of mucosal bleeding.  - grossly bloody fluid aspirated - synovial fluid sent for cell count, cultures, and crystal analysis  - obtain INR and PT as screening for hemophilia  - Stop aspirin  - refer to orthopedic surgery, urgently  - RTC in 1 week if orthopedic surgery eval has not been set up by that time, discussed return precautions/ calling the clinic if bleeding does not stop or if swelling worsens   ADDENDUM: Both PT and INR are within the reference range. Hemarthrosis can be seen in Hemophilia trait carriers, will proceed with factor VIII and IX assay for quantification of activity level, if these are <40% we can diagnose hemophilia, if the result is >40% and carrier status is still suspected we would need to proceed with genetic testing in order to definitively rule out this diagnosis. Briana Ortiz will be called to come in for factor level testing as a lab only visit asap.  Addendum 07/06/2017: Briana Ortiz did not returned for factor 8 and 9 assay, these labs should be drawn when she has reestablished in our clinic.   PROCEDURE NOTE  PROCEDURE: right knee joint aspiration   PREOPERATIVE DIAGNOSIS: Effusion  of the right knee.  POSTOPERATIVE DIAGNOSIS: Bloody right knee effusion.  PROCEDURE: The patient was apprised of the risks and the benefits of the procedure and informed consent was obtained, as witnessed by Ms. Lela Sturdivant. Time-out procedure was performed, with confirmation of the patient's name, date of birth, and correct identification of the right knee to be aspirated. The patient's knee was then marked at the appropriate site for injection placement. The knee was sterilely prepped with Betadine. A 3 mL  syringe of 1% lidocaine was drawn up and used to numb the suprapatellar tract. 25 ml of grossly bloody fluid were drawn off through an 18 gauge needle. There were no complications. The patient tolerated the procedure well. There was minimal bleeding. The patient was instructed to ice her knee upon leaving clinic and refrain from overuse over the next 3 days. The patient was instructed to go to the emergency room with any usual pain, swelling, or redness occurred in the injected area. The patient was given a followup appointment to evaluate response to the injection to his increased range of motion and reduction of pain. The procedure was supervised by attending physician, Dr. Heber [place].  Bradycardia  HR 52 at presentation today. She denied symptoms of lightheadedness, syncope, chest pain, or difficulty breathing. On chart review it is apparent that she has been persistently bradycardic with HR usually in the 50-60s. She is not on any nodal blocking agents.  - Obtained EKG today - she is in a sinus arrhythmia with HR 72, QT interval 408  - continue to monitor   See Encounters Tab for problem based charting.  Patient seen with Dr. Angelia Mould

## 2017-03-02 NOTE — Assessment & Plan Note (Signed)
HR 52 at presentation today. She denied symptoms of lightheadedness, syncope, chest pain, or difficulty breathing. On chart review it is apparent that she has been persistently bradycardic with HR usually in the 50-60s. She is not on any nodal blocking agents.  - Obtained EKG today - she is in a sinus arrhythmia with HR 72, QT interval 408  - continue to monitor

## 2017-03-02 NOTE — Progress Notes (Addendum)
Internal Medicine Clinic Attending  I saw and evaluated the patient.  I personally confirmed the key portions of the history and exam documented by Dr. Hetty Ely and I reviewed pertinent patient test results.  The assessment, diagnosis, and plan were formulated together and I agree with the documentation in the resident's note. I was present for the entire procedure. 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  We repeated PT and PTT which are again normal.  Will have her see orthopedic surgery.  Suprapatellar view of right knee effusion by U/S which guided our arthrocentesis.

## 2017-03-02 NOTE — Assessment & Plan Note (Addendum)
Visited clinic 04/2016 with complaints of sudden onset right knee pain proceeded by a "pop" and followed by swelling. She had a large bulging suprapatellar effusion which was tender to palpation and aspiration of the bursa revealed 60 cc of reddish black fluid. This was though to be consistent with meniscal injury ( given pain with flexion of the knee) and spontaneous hemarthrosis. She was told to stop takign aspirin and advised to schedule an appointment with her orthopedic surgeon. When called about two weeks after the procedure her knee pain and swelling had resolved.  On Monday of this week she began having swelling in the right knee associated with pain. She denies traumatic injury to the knee or a pop associated with the start of the swelling but she does endorse a sensation of locking of the knee at times. She denies fever or chills.  Exam is concerning for meniscal injury. 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 She denies any personal or family history of mucosal bleeding.  - grossly bloody fluid aspirated - synovial fluid sent for cell count, cultures, and crystal analysis  - obtain INR and aPTT as screening for hemophilia  - Stop aspirin  - refer to orthopedic surgery, urgently  - RTC in 1 week if orthopedic surgery eval has not been set up by that time, discussed return precautions/ calling the clinic if bleeding does not stop or if swelling worsens

## 2017-03-02 NOTE — Patient Instructions (Addendum)
Ms. Malcolm,   I am sending in a referral for you to see the surgeon, please call us if you have not heard back Monday afternoon.   Stop taking aspirin   Call the clinic [phone] if you have problems with your knee becoming more swollen or bleeding this weekend   FOLLOW-UP INSTRUCTIONS When: 1 week  For: knee swelling  What to bring: medication bottles

## 2017-03-05 ENCOUNTER — Telehealth: Payer: Self-pay

## 2017-03-05 ENCOUNTER — Encounter: Payer: Self-pay | Admitting: Internal Medicine

## 2017-03-05 MED ORDER — TRAMADOL HCL 50 MG PO TABS: 50.0000 mg | ORAL_TABLET | Freq: Every day | ORAL | 0 refills | Status: AC | PRN

## 2017-03-05 MED ORDER — NAPROXEN 500 MG PO TABS: 500.0000 mg | ORAL_TABLET | Freq: Two times a day (BID) | ORAL | 0 refills | Status: DC

## 2017-03-05 NOTE — Telephone Encounter (Signed)
Briana Ortiz needs to schedule an appointment with orthopedic surgery urgently. I noticed that there are arrangements being made for her to meet with Marlana Latus prior but she is in danger of permanent joint damage and debilitation if she delays this further. I will send in a prescription for naproxen and Tramadol (one week supply) but the arrangements for orthopedic surgery evaluation take precedence at this time. If her knee becomes swollen she needs to schedule follow up in [facility].

## 2017-03-05 NOTE — Addendum Note (Signed)
Addended by: Meryl Dare on: 03/05/2017 01:54 PM   Modules accepted: Orders

## 2017-03-05 NOTE — Telephone Encounter (Signed)
Pt calls and states her pain is at a 10, she states she is keeping leg elevated, using ice and taking tylenol and advil as instructed with no relief.  She would like some percocet as she has been prescribed percocet in the past Please advise

## 2017-03-05 NOTE — Telephone Encounter (Signed)
Requesting med for knee pain. Please call pt back.

## 2017-03-06 LAB — BODY FLUID CULTURE: Culture: NO GROWTH

## 2017-03-06 NOTE — Addendum Note (Signed)
Addended by: Meryl Dare on: 03/06/2017 08:43 AM   Modules accepted: Orders

## 2017-03-20 ENCOUNTER — Encounter: Payer: Self-pay | Admitting: Internal Medicine

## 2017-04-06 ENCOUNTER — Other Ambulatory Visit: Payer: Self-pay

## 2017-04-06 NOTE — Addendum Note (Signed)
Addended by: Hulan Fray on: 04/06/2017 08:40 PM   Modules accepted: Orders

## 2017-04-30 ENCOUNTER — Telehealth: Payer: Self-pay | Admitting: Internal Medicine

## 2017-04-30 NOTE — Telephone Encounter (Signed)
CALLED PATIENT LMT[facility], NEEDS TO MAKE AN APPT. TO RENEW [facility] CARD

## 2017-05-07 ENCOUNTER — Ambulatory Visit: Payer: Self-pay

## 2017-05-14 ENCOUNTER — Ambulatory Visit: Payer: Self-pay

## 2017-07-06 NOTE — Addendum Note (Signed)
Addended by: Meryl Dare on: 07/06/2017 01:46 PM   Modules accepted: Orders

## 2017-08-07 ENCOUNTER — Other Ambulatory Visit: Payer: Self-pay | Admitting: Internal Medicine

## 2017-08-07 NOTE — Addendum Note (Signed)
Addended by: Meryl Dare on: 08/07/2017 08:17 AM   Modules accepted: Orders

## 2017-08-09 DIAGNOSIS — Z131 Encounter for screening for diabetes mellitus: Secondary | ICD-10-CM | POA: Diagnosis not present

## 2017-08-09 DIAGNOSIS — I10 Essential (primary) hypertension: Secondary | ICD-10-CM | POA: Diagnosis not present

## 2017-08-09 DIAGNOSIS — Z1329 Encounter for screening for other suspected endocrine disorder: Secondary | ICD-10-CM | POA: Diagnosis not present

## 2017-08-09 DIAGNOSIS — E669 Obesity, unspecified: Secondary | ICD-10-CM | POA: Insufficient documentation

## 2017-08-09 DIAGNOSIS — Z1211 Encounter for screening for malignant neoplasm of colon: Secondary | ICD-10-CM | POA: Diagnosis not present

## 2017-08-14 DIAGNOSIS — H2513 Age-related nuclear cataract, bilateral: Secondary | ICD-10-CM | POA: Diagnosis not present

## 2017-08-20 ENCOUNTER — Encounter: Payer: Self-pay | Admitting: *Deleted

## 2017-09-17 DIAGNOSIS — Z1231 Encounter for screening mammogram for malignant neoplasm of breast: Secondary | ICD-10-CM | POA: Diagnosis not present

## 2017-09-17 DIAGNOSIS — Z124 Encounter for screening for malignant neoplasm of cervix: Secondary | ICD-10-CM | POA: Diagnosis not present

## 2017-09-17 DIAGNOSIS — L259 Unspecified contact dermatitis, unspecified cause: Secondary | ICD-10-CM | POA: Diagnosis not present

## 2017-09-17 DIAGNOSIS — Z01419 Encounter for gynecological examination (general) (routine) without abnormal findings: Secondary | ICD-10-CM | POA: Diagnosis not present

## 2017-09-17 DIAGNOSIS — Z1211 Encounter for screening for malignant neoplasm of colon: Secondary | ICD-10-CM | POA: Diagnosis not present

## 2017-10-02 ENCOUNTER — Encounter: Payer: Self-pay | Admitting: Internal Medicine

## 2017-10-29 ENCOUNTER — Other Ambulatory Visit: Payer: Self-pay | Admitting: Family Medicine

## 2017-10-29 DIAGNOSIS — Z1231 Encounter for screening mammogram for malignant neoplasm of breast: Secondary | ICD-10-CM

## 2017-10-30 DIAGNOSIS — M25561 Pain in right knee: Secondary | ICD-10-CM | POA: Diagnosis not present

## 2017-10-30 DIAGNOSIS — M1711 Unilateral primary osteoarthritis, right knee: Secondary | ICD-10-CM | POA: Diagnosis not present

## 2017-10-30 DIAGNOSIS — M25532 Pain in left wrist: Secondary | ICD-10-CM | POA: Diagnosis not present

## 2017-10-30 DIAGNOSIS — M222X1 Patellofemoral disorders, right knee: Secondary | ICD-10-CM | POA: Diagnosis not present

## 2017-11-04 ENCOUNTER — Other Ambulatory Visit: Payer: Self-pay

## 2017-11-04 ENCOUNTER — Emergency Department (HOSPITAL_COMMUNITY)
Admission: EM | Admit: 2017-11-04 | Discharge: 2017-11-04 | Disposition: A | Discharge status: Home / Self Care | Payer: Medicare Other | Attending: Emergency Medicine | Admitting: Emergency Medicine

## 2017-11-04 ENCOUNTER — Encounter (HOSPITAL_COMMUNITY): Payer: Self-pay | Admitting: Emergency Medicine

## 2017-11-04 ENCOUNTER — Emergency Department (HOSPITAL_COMMUNITY): Payer: Medicare Other

## 2017-11-04 DIAGNOSIS — Z79899 Other long term (current) drug therapy: Secondary | ICD-10-CM | POA: Insufficient documentation

## 2017-11-04 DIAGNOSIS — K4090 Unilateral inguinal hernia, without obstruction or gangrene, not specified as recurrent: Secondary | ICD-10-CM | POA: Insufficient documentation

## 2017-11-04 DIAGNOSIS — R1030 Lower abdominal pain, unspecified: Secondary | ICD-10-CM | POA: Diagnosis present

## 2017-11-04 DIAGNOSIS — R1031 Right lower quadrant pain: Secondary | ICD-10-CM

## 2017-11-04 DIAGNOSIS — I10 Essential (primary) hypertension: Secondary | ICD-10-CM | POA: Insufficient documentation

## 2017-11-04 LAB — COMPREHENSIVE METABOLIC PANEL
ALBUMIN: 4.4 g/dL (ref 3.5–5.0)
ALT: 19 U/L (ref 0–44)
AST: 22 U/L (ref 15–41)
Alkaline Phosphatase: 66 U/L (ref 38–126)
Anion gap: 11 (ref 5–15)
BUN: 18 mg/dL (ref 8–23)
CHLORIDE: 101 mmol/L (ref 98–111)
CO2: 27 mmol/L (ref 22–32)
Calcium: 9.7 mg/dL (ref 8.9–10.3)
Creatinine, Ser: 0.95 mg/dL (ref 0.44–1.00)
GFR calc Af Amer: >60 mL/min (ref >60)
GFR calc non Af Amer: >60 mL/min (ref >60)
GLUCOSE: 135 mg/dL — AB (ref 70–99)
POTASSIUM: 3.4 mmol/L — AB (ref 3.5–5.1)
Sodium: 139 mmol/L (ref 135–145)
Total Bilirubin: 0.8 mg/dL (ref 0.3–1.2)
Total Protein: 7.6 g/dL (ref 6.5–8.1)

## 2017-11-04 LAB — CBC
HEMATOCRIT: 43.2 % (ref 36.0–46.0)
Hemoglobin: 13.9 g/dL (ref 12.0–15.0)
MCH: 29.8 pg (ref 26.0–34.0)
MCHC: 32.2 g/dL (ref 30.0–36.0)
MCV: 92.7 fL (ref 78.0–100.0)
Platelets: 210 10*3/uL (ref 150–400)
RBC: 4.66 MIL/uL (ref 3.87–5.11)
RDW: 13.2 % (ref 11.5–15.5)
WBC: 7.5 10*3/uL (ref 4.0–10.5)

## 2017-11-04 LAB — URINALYSIS, ROUTINE W REFLEX MICROSCOPIC
Bilirubin Urine: NEGATIVE
Glucose, UA: NEGATIVE mg/dL
HGB URINE DIPSTICK: NEGATIVE
Ketones, ur: NEGATIVE mg/dL
Leukocytes, UA: NEGATIVE
Nitrite: NEGATIVE
PH: 7 (ref 5.0–8.0)
PROTEIN: NEGATIVE mg/dL
Specific Gravity, Urine: 1.006 (ref 1.005–1.030)

## 2017-11-04 LAB — LIPASE, BLOOD: Lipase: 45 U/L (ref 11–51)

## 2017-11-04 MED ORDER — IOPAMIDOL (ISOVUE-300) INJECTION 61%
INTRAVENOUS | Status: AC
  Filled 2017-11-04: qty 100

## 2017-11-04 MED ORDER — HYDROCODONE-ACETAMINOPHEN 5-325 MG PO TABS: 1.0000 | ORAL_TABLET | ORAL | 0 refills | Status: DC | PRN

## 2017-11-04 MED ORDER — ONDANSETRON HCL 4 MG/2ML IJ SOLN
4.0000 mg | Freq: Once | INTRAMUSCULAR | Status: AC
  Administered 2017-11-04: 4 mg via INTRAVENOUS
  Filled 2017-11-04: qty 2

## 2017-11-04 MED ORDER — SODIUM CHLORIDE 0.9 % IV BOLUS
1000.0000 mL | Freq: Once | INTRAVENOUS | Status: AC
  Administered 2017-11-04: 1000 mL via INTRAVENOUS

## 2017-11-04 MED ORDER — ACETAMINOPHEN 500 MG PO TABS
1000.0000 mg | ORAL_TABLET | Freq: Once | ORAL | Status: AC
  Administered 2017-11-04: 1000 mg via ORAL
  Filled 2017-11-04: qty 2

## 2017-11-04 MED ORDER — IOPAMIDOL (ISOVUE-300) INJECTION 61%
100.0000 mL | Freq: Once | INTRAVENOUS | Status: AC | PRN
  Administered 2017-11-04: 100 mL via INTRAVENOUS

## 2017-11-04 MED ORDER — SODIUM CHLORIDE 0.9 % IJ SOLN
INTRAMUSCULAR | Status: AC
  Filled 2017-11-04: qty 50

## 2017-11-04 NOTE — ED Notes (Addendum)
**  Pt stated that she "took drugs recently and does not want this discussed in front of her daughter"**

## 2017-11-04 NOTE — ED Provider Notes (Signed)
[facility] DEPT Provider Note   CSN: [id number] Arrival date & time: 11/04/17  4944     History   Chief Complaint Chief Complaint  Patient presents with  . Abdominal Pain    HPI Briana Ortiz is a 67 y.o. female.  Patient is a 67 year old female who presents with lower abdominal pain.  She states that for several months she has had pain in her right leg.  She states that the pain starts in her lower leg and radiates up to her hip area.  She states now she is complaining of pain to her groin.  It is not seemingly worse with movement of the hip.  She denies any numbness of the leg.  No nausea or vomiting.  No fevers.  She recently was seen by Dr. Berenice Primas, her orthopedist who injected her knee.  She denies any history of injury to this area.  No fevers.  No urinary symptoms.  She does say that she recently has done cocaine.     Past Medical History:  Diagnosis Date  . Arthritis   . GERD (gastroesophageal reflux disease)   . Hypertension   . Uterine fibroid     Patient Active Problem List   Diagnosis Date Noted  . Bradycardia 03/02/2017  . Nail abnormality 07/24/2016  . Acute viral sinusitis 07/24/2016  . Hemarthrosis of right knee 05/26/2016  . Biceps tendinitis 01/13/2016  . Pruritic rash 08/26/2015  . Chronic back pain 05/27/2014  . Uterine fibroid 01/21/2014  . Health care maintenance 09/12/2013  . Left hand pain 08/15/2012  . Osteoarthritis of left hip 03/14/2011  . Essential hypertension 12/09/2005  . GERD 12/09/2005    Past Surgical History:  Procedure Laterality Date  . HEMORRHOID SURGERY    . TONSILLECTOMY    . 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 TUBAL LIGATION       OB History    Gravida  1   Para  1   Term  1   Preterm      AB      Living  1     SAB      TAB        Ectopic      Multiple      Live Births               Home Medications    Prior to Admission medications   Medication Sig Start Date End Date Taking? Authorizing Provider  cetirizine (ZYRTEC ALLERGY) 10 MG tablet Take 1 tablet (10 mg total) by mouth daily. 09/20/15  Yes Delsa Grana, PA-C  diclofenac sodium (VOLTAREN) 1 % GEL Apply 4 g topically 4 (four) times daily as needed. 04/26/16  Yes Velna Ochs, MD  gabapentin (NEURONTIN) 600 MG tablet Take 0.5-1 tablets (300-600 mg total) by mouth 2 (two) times daily as needed. Patient taking differently: Take 300-600 mg by mouth 2 (two) times daily as needed (pain).  01/10/16  Yes Wendie Agreste, MD  ibuprofen (ADVIL,MOTRIN) 200 MG tablet Take 200-400 mg by mouth every 6 (six) hours as needed for moderate pain.    Yes [provider]  lisinopril-hydrochlorothiazide (PRINZIDE,ZESTORETIC) 20-25 MG tablet TAKE 1 TABLET BY MOUTH ONCE DAILY IN THE MORNING 08/07/17  Yes Oda Kilts, MD  Multiple Vitamin (MULTIVITAMIN WITH MINERALS) TABS Take 1 tablet by mouth  daily.   Yes [provider]  erythromycin ophthalmic ointment Place 1 application into the left eye every 6 (six) hours. Place 1/2 inch ribbon of ointment in the affected eye 4 times a day Patient not taking: Reported on 11/04/2017 09/20/15   Delsa Grana, PA-C  famotidine (PEPCID) 20 MG tablet Take 1 tablet (20 mg total) by mouth daily. Patient not taking: Reported on 11/04/2017 08/26/15 08/25/16  Rivet, Sindy Guadeloupe, MD  fluticasone (FLONASE) 50 MCG/ACT nasal spray Place 2 sprays into both nostrils daily. Patient not taking: Reported on 11/04/2017 07/24/16   Shela Leff, MD  HYDROcodone-acetaminophen (NORCO/VICODIN) 5-325 MG tablet Take 1-2 tablets by mouth every 4 (four) hours as needed. 11/04/17   Malvin Johns, MD  loratadine (CLARITIN REDITABS) 10 MG dissolvable tablet Take 1 tablet (10 mg total) by mouth every morning. Patient not taking: Reported on  11/04/2017 08/26/15 06/22/18  Rivet, Sindy Guadeloupe, MD  naproxen (NAPROSYN) 500 MG tablet Take 1 tablet (500 mg total) by mouth 2 (two) times daily with a meal. Patient not taking: Reported on 11/04/2017 03/05/17   Ledell Noss, MD  oxyCODONE-acetaminophen (PERCOCET/ROXICET) 5-325 MG tablet Take 1 tablet by mouth every 4 (four) hours as needed for severe pain. Patient not taking: Reported on 11/04/2017 01/12/16   Charlann Lange, PA-C  traMADol (ULTRAM) 50 MG tablet Take 1 tablet (50 mg total) by mouth daily as needed. Patient not taking: Reported on 11/04/2017 03/05/17 03/05/18  Ledell Noss, MD  triamcinolone cream (KENALOG) 0.1 % Apply 1 application topically 2 (two) times daily. Patient not taking: Reported on 11/04/2017 08/26/15   RivetSindy Guadeloupe, MD    Family History Family History  Problem Relation Age of Onset  . Alcohol abuse Mother   . Breast cancer Neg Hx   . Colon cancer Neg Hx   . Ovarian cancer Neg Hx   . Stroke Neg Hx     Social History Social History   Tobacco Use  . Smoking status: Never Smoker  . Smokeless tobacco: Never Used  Substance Use Topics  . Alcohol use: Yes    Alcohol/week: 5.0 - 6.0 standard drinks    Types: 5 - 6 Cans of beer per week    Comment: Occasionally.  . Drug use: No     Allergies   Patient has no known allergies.   Review of Systems Review of Systems  Constitutional: Negative for chills, diaphoresis, fatigue and fever.  HENT: Negative for congestion, rhinorrhea and sneezing.   Eyes: Negative.   Respiratory: Negative for cough, chest tightness and shortness of breath.   Cardiovascular: Negative for chest pain and leg swelling.  Gastrointestinal: Positive for abdominal pain. Negative for blood in stool, diarrhea, nausea and vomiting.  Genitourinary: Negative for difficulty urinating, flank pain, frequency and hematuria.  Musculoskeletal: Positive for arthralgias. Negative for back pain.  Skin: Negative for rash.  Neurological: Negative for dizziness,  speech difficulty, weakness, numbness and headaches.     Physical Exam Updated Vital Signs BP (!) 149/138   Pulse 64   Temp 98.2 F (36.8 C) (Oral)   Resp 18   Ht 5' (1.524 m)   Wt 90.7 kg   SpO2 100%   BMI 39.06 kg/m   Physical Exam  Constitutional: She is oriented to person, place, and time. She appears well-developed and well-nourished.  HENT:  Head: Normocephalic and atraumatic.  Eyes: Pupils are equal, round, and reactive to light.  Neck: Normal range of motion. Neck supple.  Cardiovascular: Normal rate, regular rhythm  and normal heart sounds.  Pulmonary/Chest: Effort normal and breath sounds normal. No respiratory distress. She has no wheezes. She has no rales. She exhibits no tenderness.  Abdominal: Soft. Bowel sounds are normal. There is tenderness in the right lower quadrant. There is no rebound and no guarding.  Patient has tenderness to the right inguinal canal and right lower abdomen.  There is no palpable hernias.  Musculoskeletal: Normal range of motion. She exhibits no edema.  There is some generalized pain on movement of the right leg.  No specific joint tenderness.  No swelling of the leg.  She has normal sensation and motor function in the leg.  Pedal pulses are intact.  Lymphadenopathy:    She has no cervical adenopathy.  Neurological: She is alert and oriented to person, place, and time.  Skin: Skin is warm and dry. No rash noted.  Psychiatric: She has a normal mood and affect.     ED Treatments / Results  Labs (all labs ordered are listed, but only abnormal results are displayed) Labs Reviewed  COMPREHENSIVE METABOLIC PANEL - Abnormal; Notable for the following components:      Result Value   Potassium 3.4 (*)    Glucose, Bld 135 (*)    All other components within normal limits  URINALYSIS, ROUTINE W REFLEX MICROSCOPIC - Abnormal; Notable for the following components:   Color, Urine STRAW (*)    All other components within normal limits  LIPASE,  BLOOD  CBC    EKG None  Radiology Ct Abdomen Pelvis W Contrast  Result Date: 11/04/2017 CLINICAL DATA:  Lower abdominal pain, nausea, vomiting EXAM: CT ABDOMEN AND PELVIS WITH CONTRAST TECHNIQUE: Multidetector CT imaging of the abdomen and pelvis was performed using the standard protocol following bolus administration of intravenous contrast. CONTRAST:  139mL ISOVUE-300 IOPAMIDOL (ISOVUE-300) INJECTION 61% COMPARISON:  None. FINDINGS: Lower chest: Linear areas of scarring or atelectasis in the lung bases. Heart is normal size. No effusions. Hepatobiliary: No focal hepatic abnormality. Gallbladder unremarkable. Pancreas: No focal abnormality or ductal dilatation. Spleen: No focal abnormality.  Normal size. Adrenals/Urinary Tract: No adrenal abnormality. No focal renal abnormality. No stones or hydronephrosis. Urinary bladder is unremarkable. Stomach/Bowel: Stomach, large and small bowel grossly unremarkable. Normal appendix. Vascular/Lymphatic: No evidence of aneurysm or adenopathy. Reproductive: 7 cm calcified fibroid centrally within the uterus. No adnexal masses. Other: No free fluid or free air. Musculoskeletal: Prior left hip replacement. No acute bony abnormality. IMPRESSION: No acute findings in the abdomen or pelvis. 7 cm calcified fibroid. Electronically Signed   By: Rolm Baptise M.D.   On: 11/04/2017 09:11    Procedures Procedures (including critical care time)  Medications Ordered in ED Medications  iopamidol (ISOVUE-300) 61 % injection (has no administration in time range)  sodium chloride 0.9 % injection (has no administration in time range)  iopamidol (ISOVUE-300) 61 % injection 100 mL (100 mLs Intravenous Contrast Given 11/04/17 0852)  ondansetron (ZOFRAN) injection 4 mg (4 mg Intravenous Given 11/04/17 0915)  sodium chloride 0.9 % bolus 1,000 mL (0 mLs Intravenous Stopped 11/04/17 1043)  acetaminophen (TYLENOL) tablet 1,000 mg (1,000 mg Oral Given 11/04/17 1041)     Initial  Impression / Assessment and Plan / ED Course  I have reviewed the triage vital signs and the nursing notes.  Pertinent labs & imaging results that were available during my care of the patient were reviewed by me and considered in my medical decision making (see chart for details).     Patient presents  with pain in her right leg.  She has complaints of pain in her right inguinal area and lower abdomen but it seems to be radiating down her leg.  Its tender when I palpate her inguinal canal but I do not feel any hernia.  A CT scan was performed which shows no evidence of hernia.  There is a fibroid in her uterus but she states that this is been there for a long time and I do not feel that that is the etiology for her symptoms.  She did have an episode of vomiting in the ED but she states she really was not nauseated but rather that it started after a little coughing spell.  She is had no further vomiting.  She is tolerating oral fluids.  She has no fevers.  Her labs are non-concerning.  I did encourage her to follow back up with Dr. Berenice Primas as a seems to be more musculoskeletal in nature.  She is ambulating without too much difficulty.  I am concerned that maybe she has a nerve entrapment in the area.  She has no neurologic deficits however.  She was discharged home in good condition.  She was given a short-term prescription of Vicodin.  I do not see any recent opioid prescriptions have been filled.  She was encouraged to make an appointment to follow-up with Dr. Berenice Primas.  On discharge, her blood pressure is noted to be elevated.  She states she has been taking her blood pressure medication regularly.  I encouraged her to follow-up with her PCP.  Final Clinical Impressions(s) / ED Diagnoses   Final diagnoses:  Right inguinal pain    ED Discharge Orders         Ordered    HYDROcodone-acetaminophen (NORCO/VICODIN) 5-325 MG tablet  Every 4 hours PRN     11/04/17 1049           Malvin Johns,  MD 11/04/17 1058

## 2017-11-04 NOTE — ED Notes (Signed)
Patient aware we need urine sample. Will call out when ready to void.  

## 2017-11-04 NOTE — ED Notes (Signed)
Patient does not want to wait for peer support.

## 2017-11-04 NOTE — ED Triage Notes (Signed)
Pt reports right lower quadrant abdominal pain that started tonight. Pt denies any nausea or vomiting.

## 2017-11-04 NOTE — ED Notes (Signed)
Patient given PO fluids per MD.

## 2017-11-04 NOTE — ED Notes (Signed)
Patient was coughing in her room. She got up to the sink and started heaving up some thick, clear mucus. Pt reported nasal congestion and clear drainage.

## 2017-11-09 DIAGNOSIS — K219 Gastro-esophageal reflux disease without esophagitis: Secondary | ICD-10-CM | POA: Diagnosis not present

## 2017-11-09 DIAGNOSIS — R1031 Right lower quadrant pain: Secondary | ICD-10-CM | POA: Diagnosis not present

## 2017-11-09 DIAGNOSIS — Z09 Encounter for follow-up examination after completed treatment for conditions other than malignant neoplasm: Secondary | ICD-10-CM | POA: Diagnosis not present

## 2017-11-19 DIAGNOSIS — M545 Low back pain: Secondary | ICD-10-CM | POA: Diagnosis not present

## 2017-11-19 DIAGNOSIS — M5441 Lumbago with sciatica, right side: Secondary | ICD-10-CM | POA: Diagnosis not present

## 2017-11-19 DIAGNOSIS — M1611 Unilateral primary osteoarthritis, right hip: Secondary | ICD-10-CM | POA: Diagnosis not present

## 2017-12-06 ENCOUNTER — Ambulatory Visit
Admission: RE | Admit: 2017-12-06 | Discharge: 2017-12-06 | Disposition: A | Discharge status: Home / Self Care | Payer: Medicare Other | Source: Ambulatory Visit | Attending: Family Medicine | Admitting: Family Medicine

## 2017-12-06 DIAGNOSIS — Z1231 Encounter for screening mammogram for malignant neoplasm of breast: Secondary | ICD-10-CM

## 2018-04-25 DIAGNOSIS — Z23 Encounter for immunization: Secondary | ICD-10-CM | POA: Diagnosis not present

## 2018-06-06 DIAGNOSIS — M1711 Unilateral primary osteoarthritis, right knee: Secondary | ICD-10-CM | POA: Diagnosis not present

## 2018-06-06 DIAGNOSIS — M25561 Pain in right knee: Secondary | ICD-10-CM | POA: Diagnosis not present

## 2018-06-18 DIAGNOSIS — Z7689 Persons encountering health services in other specified circumstances: Secondary | ICD-10-CM | POA: Diagnosis not present

## 2018-07-30 ENCOUNTER — Other Ambulatory Visit: Payer: Self-pay | Admitting: Orthopedic Surgery

## 2018-08-07 DIAGNOSIS — Z01812 Encounter for preprocedural laboratory examination: Secondary | ICD-10-CM | POA: Diagnosis not present

## 2018-08-07 DIAGNOSIS — I10 Essential (primary) hypertension: Secondary | ICD-10-CM | POA: Diagnosis not present

## 2018-08-07 DIAGNOSIS — Z131 Encounter for screening for diabetes mellitus: Secondary | ICD-10-CM | POA: Diagnosis not present

## 2018-09-17 ENCOUNTER — Other Ambulatory Visit (HOSPITAL_COMMUNITY): Payer: Medicare Other

## 2018-09-17 NOTE — Progress Notes (Signed)
SPOKE WITH PATIENT BY PHONE SCAT WILL NOT TAKE PATIENT FOR COVID TEST, PATIENT TO ARRIVE 640 AM 09-20-2018 FOR RAPID COVID AT [facility]. PATIENT AWARE.

## 2018-09-17 NOTE — Patient Instructions (Addendum)
Briana Ortiz    Your procedure is scheduled on: 09-20-2018    Report to [facility] Main  Entrance       Report to admitting at 6:40AM    1 VISITOR IS ALLOWED TO WAIT IN WAITING ROOM  ONLY DAY OF YOUR SURGERY. NO VISITORS ARE ALLOWED IN SHORT STAY OR RECOVERY ROOM.   Call this number if you have problems the morning of surgery [phone]    Remember:  [place], NO CHEWING GUM CANDY OR MINTS.     NO SOLID FOOD AFTER MIDNIGHT THE NIGHT PRIOR TO SURGERY. NOTHING BY MOUTH EXCEPT CLEAR LIQUIDS UNTIL  640 AM . PLEASE FINISH ENSURE DRINK PER SURGEON ORDER  WHICH NEEDS TO BE COMPLETED AT 640 AM .   CLEAR LIQUID DIET   Foods Allowed                                                                     Foods Excluded  Coffee and tea, regular and decaf                             liquids that you cannot  Plain Jell-O any favor except red or purple                                           see through such as: Fruit ices (not with fruit pulp)                                     milk, soups, orange juice  Iced Popsicles                                    All solid food Carbonated beverages, regular and diet                                    Cranberry, grape and apple juices Sports drinks like Gatorade Lightly seasoned clear broth or consume(fat free) Sugar, honey syrup  Sample Menu Breakfast                                Lunch                                     Supper Cranberry juice                    Beef broth                            Chicken broth Jell-O  Grape juice                           Apple juice Coffee or tea                        Jell-O                                      Popsicle                                                Coffee or tea                        Coffee or  tea  _____________________________________________________________________     Take these medicines the morning of surgery with A SIP OF WATER: Cetirizine, Hydrocodone if needed                                You may not have any metal on your body including hair pins and              piercings  Do not wear jewelry, make-up, lotions, powders or perfumes, deodorant             Do not wear nail polish.  Do not shave  48 hours prior to surgery.                 Do not bring valuables to the hospital. [place].  Contacts, dentures or bridgework may not be worn into surgery.  Leave suitcase in the car. After surgery it may be brought to your room.     _____________________________________________________________________             [facility] - Preparing for Surgery Before surgery, you can play an important role.  Because skin is not sterile, your skin needs to be as free of germs as possible.  You can reduce the number of germs on your skin by washing with CHG (chlorahexidine gluconate) soap before surgery.  CHG is an antiseptic cleaner which kills germs and bonds with the skin to continue killing germs even after washing. Please DO NOT use if you have an allergy to CHG or antibacterial soaps.  If your skin becomes reddened/irritated stop using the CHG and inform your nurse when you arrive at Short Stay. Do not shave (including legs and underarms) for at least 48 hours prior to the first CHG shower.  You may shave your face/neck. Please follow these instructions carefully:  1.  Shower with CHG Soap the night before surgery and the  morning of Surgery.  2.  If you choose to wash your hair, wash your hair first as usual with your  normal  shampoo.  3.  After you shampoo, rinse your hair and body thoroughly to remove the  shampoo.                           4.  Use CHG as you  would any other liquid soap.  You can apply chg directly  to the  skin and wash                       Gently with a scrungie or clean washcloth.  5.  Apply the CHG Soap to your body ONLY FROM THE NECK DOWN.   Do not use on face/ open                           Wound or open sores. Avoid contact with eyes, ears mouth and genitals (private parts).                       Wash face,  Genitals (private parts) with your normal soap.             6.  Wash thoroughly, paying special attention to the area where your surgery  will be performed.  7.  Thoroughly rinse your body with warm water from the neck down.  8.  DO NOT shower/wash with your normal soap after using and rinsing off  the CHG Soap.                9.  Pat yourself dry with a clean towel.            10.  Wear clean pajamas.            11.  Place clean sheets on your bed the night of your first shower and do not  sleep with pets. Day of Surgery : Do not apply any lotions/deodorants the morning of surgery.  Please wear clean clothes to the hospital/surgery center.  FAILURE TO FOLLOW THESE INSTRUCTIONS MAY RESULT IN THE CANCELLATION OF YOUR SURGERY PATIENT SIGNATURE_________________________________  NURSE SIGNATURE__________________________________  ________________________________________________________________________   Adam Phenix  An incentive spirometer is a tool that can help keep your lungs clear and active. This tool measures how well you are filling your lungs with each breath. Taking long deep breaths may help reverse or decrease the chance of developing breathing (pulmonary) problems (especially infection) following:  A long period of time when you are unable to move or be active. BEFORE THE PROCEDURE   If the spirometer includes an indicator to show your best effort, your nurse or respiratory therapist will set it to a desired goal.  If possible, sit up straight or lean slightly forward. Try not to slouch.  Hold the incentive spirometer in an upright position. INSTRUCTIONS FOR  USE  1. Sit on the edge of your bed if possible, or sit up as far as you can in bed or on a chair. 2. Hold the incentive spirometer in an upright position. 3. Breathe out normally. 4. Place the mouthpiece in your mouth and seal your lips tightly around it. 5. Breathe in slowly and as deeply as possible, raising the piston or the ball toward the top of the column. 6. Hold your breath for 3-5 seconds or for as long as possible. Allow the piston or ball to fall to the bottom of the column. 7. Remove the mouthpiece from your mouth and breathe out normally. 8. Rest for a few seconds and repeat Steps 1 through 7 at least 10 times every 1-2 hours when you are awake. Take your time and take a few normal breaths between deep breaths. 9. The spirometer may include an indicator to show your best effort. Use the  indicator as a goal to work toward during each repetition. 10. After each set of 10 deep breaths, practice coughing to be sure your lungs are clear. If you have an incision (the cut made at the time of surgery), support your incision when coughing by placing a pillow or rolled up towels firmly against it. Once you are able to get out of bed, walk around indoors and cough well. You may stop using the incentive spirometer when instructed by your caregiver.  RISKS AND COMPLICATIONS  Take your time so you do not get dizzy or light-headed.  If you are in pain, you may need to take or ask for pain medication before doing incentive spirometry. It is harder to take a deep breath if you are having pain. AFTER USE  Rest and breathe slowly and easily.  It can be helpful to keep track of a log of your progress. Your caregiver can provide you with a simple table to help with this. If you are using the spirometer at home, follow these instructions: [place] IF:   You are having difficultly using the spirometer.  You have trouble using the spirometer as often as instructed.  Your pain medication  is not giving enough relief while using the spirometer.  You develop fever of 100.5 F (38.1 C) or higher. SEEK IMMEDIATE MEDICAL CARE IF:   You cough up bloody sputum that had not been present before.  You develop fever of 102 F (38.9 C) or greater.  You develop worsening pain at or near the incision site. MAKE SURE YOU:   Understand these instructions.  Will watch your condition.  Will get help right away if you are not doing well or get worse. Document Released: 05/29/2006 Document Revised: 04/10/2011 Document Reviewed: 07/30/2006 Exit[employer] Patient Information 2014 [employer], [place].   ________________________________________________________________________  WHAT IS A BLOOD TRANSFUSION? Blood Transfusion Information  A transfusion is the replacement of blood or some of its parts. Blood is made up of multiple cells which provide different functions.  Red blood cells carry oxygen and are used for blood loss replacement.  White blood cells fight against infection.  Platelets control bleeding.  Plasma helps clot blood.  Other blood products are available for specialized needs, such as hemophilia or other clotting disorders. BEFORE THE TRANSFUSION  Who gives blood for transfusions?   Healthy volunteers who are fully evaluated to make sure their blood is safe. This is blood bank blood. Transfusion therapy is the safest it has ever been in the practice of medicine. Before blood is taken from a donor, a complete history is taken to make sure that person has no history of diseases nor engages in risky social behavior (examples are intravenous drug use or sexual activity with multiple partners). The donor's travel history is screened to minimize risk of transmitting infections, such as malaria. The donated blood is tested for signs of infectious diseases, such as HIV and hepatitis. The blood is then tested to be sure it is compatible with you in order to minimize the chance of a  transfusion reaction. If you or a relative donates blood, this is often done in anticipation of surgery and is not appropriate for emergency situations. It takes many days to process the donated blood. RISKS AND COMPLICATIONS Although transfusion therapy is very safe and saves many lives, the main dangers of transfusion include:   Getting an infectious disease.  Developing a transfusion reaction. This is an allergic reaction to something in the blood you  were given. Every precaution is taken to prevent this. The decision to have a blood transfusion has been considered carefully by your caregiver before blood is given. Blood is not given unless the benefits outweigh the risks. AFTER THE TRANSFUSION  Right after receiving a blood transfusion, you will usually feel much better and more energetic. This is especially true if your red blood cells have gotten low (anemic). The transfusion raises the level of the red blood cells which carry oxygen, and this usually causes an energy increase.  The nurse administering the transfusion will monitor you carefully for complications. HOME CARE INSTRUCTIONS  No special instructions are needed after a transfusion. You may find your energy is better. Speak with your caregiver about any limitations on activity for underlying diseases you may have. SEEK MEDICAL CARE IF:   Your condition is not improving after your transfusion.  You develop redness or irritation at the intravenous (IV) site. SEEK IMMEDIATE MEDICAL CARE IF:  Any of the following symptoms occur over the next 12 hours:  Shaking chills.  You have a temperature by mouth above 102 F (38.9 C), not controlled by medicine.  Chest, back, or muscle pain.  People around you feel you are not acting correctly or are confused.  Shortness of breath or difficulty breathing.  Dizziness and fainting.  You get a rash or develop hives.  You have a decrease in urine output.  Your urine turns a dark  color or changes to pink, red, or brown. Any of the following symptoms occur over the next 10 days:  You have a temperature by mouth above 102 F (38.9 C), not controlled by medicine.  Shortness of breath.  Weakness after normal activity.  The white part of the eye turns yellow (jaundice).  You have a decrease in the amount of urine or are urinating less often.  Your urine turns a dark color or changes to pink, red, or brown. Document Released: 01/14/2000 Document Revised: 04/10/2011 Document Reviewed: 09/02/2007 [facility] Patient Information 2014 [place], [place].  _______________________________________________________________________

## 2018-09-18 ENCOUNTER — Encounter (HOSPITAL_COMMUNITY): Payer: Self-pay

## 2018-09-18 ENCOUNTER — Other Ambulatory Visit: Payer: Self-pay

## 2018-09-18 ENCOUNTER — Ambulatory Visit (HOSPITAL_COMMUNITY)
Admission: RE | Admit: 2018-09-18 | Discharge: 2018-09-18 | Disposition: A | Discharge status: Home / Self Care | Payer: Medicare Other | Source: Ambulatory Visit | Attending: Orthopedic Surgery | Admitting: Orthopedic Surgery

## 2018-09-18 ENCOUNTER — Encounter (HOSPITAL_COMMUNITY)
Admission: RE | Admit: 2018-09-18 | Discharge: 2018-09-18 | Disposition: A | Discharge status: Home / Self Care | Payer: Medicare Other | Source: Ambulatory Visit | Attending: Orthopedic Surgery | Admitting: Orthopedic Surgery

## 2018-09-18 DIAGNOSIS — M1711 Unilateral primary osteoarthritis, right knee: Secondary | ICD-10-CM | POA: Insufficient documentation

## 2018-09-18 DIAGNOSIS — I10 Essential (primary) hypertension: Secondary | ICD-10-CM | POA: Insufficient documentation

## 2018-09-18 DIAGNOSIS — Z01818 Encounter for other preprocedural examination: Secondary | ICD-10-CM | POA: Diagnosis not present

## 2018-09-18 DIAGNOSIS — Z01811 Encounter for preprocedural respiratory examination: Secondary | ICD-10-CM

## 2018-09-18 DIAGNOSIS — J984 Other disorders of lung: Secondary | ICD-10-CM | POA: Insufficient documentation

## 2018-09-18 DIAGNOSIS — K219 Gastro-esophageal reflux disease without esophagitis: Secondary | ICD-10-CM | POA: Insufficient documentation

## 2018-09-18 DIAGNOSIS — Z79899 Other long term (current) drug therapy: Secondary | ICD-10-CM | POA: Insufficient documentation

## 2018-09-18 HISTORY — DX: Cardiac arrhythmia, unspecified: I49.9

## 2018-09-18 HISTORY — DX: Allergy, unspecified, initial encounter: T78.40XA

## 2018-09-18 LAB — URINALYSIS, ROUTINE W REFLEX MICROSCOPIC
Bacteria, UA: NONE SEEN
Bilirubin Urine: NEGATIVE
Glucose, UA: NEGATIVE mg/dL
Hgb urine dipstick: NEGATIVE
Ketones, ur: NEGATIVE mg/dL
Nitrite: NEGATIVE
Protein, ur: NEGATIVE mg/dL
Specific Gravity, Urine: 1.02 (ref 1.005–1.030)
pH: 5 (ref 5.0–8.0)

## 2018-09-18 LAB — COMPREHENSIVE METABOLIC PANEL
ALT: 21 U/L (ref 0–44)
AST: 24 U/L (ref 15–41)
Albumin: 4.2 g/dL (ref 3.5–5.0)
Alkaline Phosphatase: 77 U/L (ref 38–126)
Anion gap: 10 (ref 5–15)
BUN: 19 mg/dL (ref 8–23)
CO2: 29 mmol/L (ref 22–32)
Calcium: 9.4 mg/dL (ref 8.9–10.3)
Chloride: 101 mmol/L (ref 98–111)
Creatinine, Ser: 1.18 mg/dL — ABNORMAL HIGH (ref 0.44–1.00)
GFR calc Af Amer: 56 mL/min — ABNORMAL LOW (ref >60)
GFR calc non Af Amer: 48 mL/min — ABNORMAL LOW (ref >60)
Glucose, Bld: 93 mg/dL (ref 70–99)
Potassium: 3.8 mmol/L (ref 3.5–5.1)
Sodium: 140 mmol/L (ref 135–145)
Total Bilirubin: 0.5 mg/dL (ref 0.3–1.2)
Total Protein: 7.9 g/dL (ref 6.5–8.1)

## 2018-09-18 LAB — CBC WITH DIFFERENTIAL/PLATELET
Abs Immature Granulocytes: 0.03 10*3/uL (ref 0.00–0.07)
Basophils Absolute: 0 10*3/uL (ref 0.0–0.1)
Basophils Relative: 0 %
Eosinophils Absolute: 0.2 10*3/uL (ref 0.0–0.5)
Eosinophils Relative: 2 %
HCT: 45.1 % (ref 36.0–46.0)
Hemoglobin: 14.1 g/dL (ref 12.0–15.0)
Immature Granulocytes: 0 %
Lymphocytes Relative: 25 %
Lymphs Abs: 1.9 10*3/uL (ref 0.7–4.0)
MCH: 30.3 pg (ref 26.0–34.0)
MCHC: 31.3 g/dL (ref 30.0–36.0)
MCV: 97 fL (ref 80.0–100.0)
Monocytes Absolute: 0.2 10*3/uL (ref 0.1–1.0)
Monocytes Relative: 3 %
Neutro Abs: 5.3 10*3/uL (ref 1.7–7.7)
Neutrophils Relative %: 70 %
Platelets: 210 10*3/uL (ref 150–400)
RBC: 4.65 MIL/uL (ref 3.87–5.11)
RDW: 12.9 % (ref 11.5–15.5)
WBC: 7.7 10*3/uL (ref 4.0–10.5)
nRBC: 0 % (ref 0.0–0.2)

## 2018-09-18 LAB — SURGICAL PCR SCREEN
MRSA, PCR: NEGATIVE
Staphylococcus aureus: NEGATIVE

## 2018-09-18 LAB — PROTIME-INR
INR: 0.9 (ref 0.8–1.2)
Prothrombin Time: 12 seconds (ref 11.4–15.2)

## 2018-09-18 LAB — APTT: aPTT: 29 seconds (ref 24–36)

## 2018-09-18 NOTE — Progress Notes (Signed)
EKG and HGBA1C 08-07-2018 ON CHART FROM [facility]

## 2018-09-19 MED ORDER — BUPIVACAINE LIPOSOME 1.3 % IJ SUSP
20.0000 mL | Freq: Once | INTRAMUSCULAR | Status: DC
  Filled 2018-09-19: qty 20

## 2018-09-20 ENCOUNTER — Inpatient Hospital Stay (HOSPITAL_COMMUNITY): Payer: Medicare Other | Admitting: Physician Assistant

## 2018-09-20 ENCOUNTER — Encounter (HOSPITAL_COMMUNITY)
Admission: RE | Disposition: A | Discharge status: Home Health | Payer: Self-pay | Source: Home / Self Care | Attending: Orthopedic Surgery

## 2018-09-20 ENCOUNTER — Other Ambulatory Visit: Payer: Self-pay

## 2018-09-20 ENCOUNTER — Ambulatory Visit (HOSPITAL_COMMUNITY)
Admission: RE | Admit: 2018-09-20 | Discharge: 2018-09-22 | Disposition: A | Discharge status: Home Health | Payer: Medicare Other | Attending: Orthopedic Surgery | Admitting: Orthopedic Surgery

## 2018-09-20 ENCOUNTER — Encounter (HOSPITAL_COMMUNITY): Payer: Self-pay | Admitting: Emergency Medicine

## 2018-09-20 ENCOUNTER — Inpatient Hospital Stay (HOSPITAL_COMMUNITY): Payer: Medicare Other | Admitting: Certified Registered Nurse Anesthetist

## 2018-09-20 DIAGNOSIS — D62 Acute posthemorrhagic anemia: Secondary | ICD-10-CM | POA: Insufficient documentation

## 2018-09-20 DIAGNOSIS — G8918 Other acute postprocedural pain: Secondary | ICD-10-CM | POA: Diagnosis not present

## 2018-09-20 DIAGNOSIS — Z96642 Presence of left artificial hip joint: Secondary | ICD-10-CM | POA: Diagnosis not present

## 2018-09-20 DIAGNOSIS — Z20828 Contact with and (suspected) exposure to other viral communicable diseases: Secondary | ICD-10-CM | POA: Insufficient documentation

## 2018-09-20 DIAGNOSIS — K219 Gastro-esophageal reflux disease without esophagitis: Secondary | ICD-10-CM | POA: Insufficient documentation

## 2018-09-20 DIAGNOSIS — I10 Essential (primary) hypertension: Secondary | ICD-10-CM | POA: Insufficient documentation

## 2018-09-20 DIAGNOSIS — M545 Low back pain: Secondary | ICD-10-CM | POA: Insufficient documentation

## 2018-09-20 DIAGNOSIS — M1711 Unilateral primary osteoarthritis, right knee: Secondary | ICD-10-CM | POA: Diagnosis not present

## 2018-09-20 HISTORY — PX: TOTAL KNEE ARTHROPLASTY: SHX125

## 2018-09-20 LAB — SARS CORONAVIRUS 2 BY RT PCR (HOSPITAL ORDER, PERFORMED IN ~~LOC~~ HOSPITAL LAB): SARS Coronavirus 2: NEGATIVE

## 2018-09-20 SURGERY — ARTHROPLASTY, KNEE, TOTAL
Anesthesia: Spinal | Site: Knee | Laterality: Right

## 2018-09-20 MED ORDER — ASPIRIN EC 325 MG PO TBEC: 325.0000 mg | DELAYED_RELEASE_TABLET | Freq: Two times a day (BID) | ORAL | 0 refills | Status: DC

## 2018-09-20 MED ORDER — MIDAZOLAM HCL 2 MG/2ML IJ SOLN
1.0000 mg | INTRAMUSCULAR | Status: DC
  Filled 2018-09-20: qty 2

## 2018-09-20 MED ORDER — METHOCARBAMOL 500 MG PO TABS
500.0000 mg | ORAL_TABLET | Freq: Four times a day (QID) | ORAL | Status: DC | PRN
  Administered 2018-09-20 – 2018-09-22 (×6): 500 mg via ORAL
  Filled 2018-09-20 (×6): qty 1

## 2018-09-20 MED ORDER — GABAPENTIN 300 MG PO CAPS
300.0000 mg | ORAL_CAPSULE | Freq: Two times a day (BID) | ORAL | Status: DC
  Administered 2018-09-20 – 2018-09-22 (×5): 300 mg via ORAL
  Filled 2018-09-20 (×5): qty 1

## 2018-09-20 MED ORDER — LISINOPRIL 20 MG PO TABS
20.0000 mg | ORAL_TABLET | Freq: Every day | ORAL | Status: DC
  Administered 2018-09-20 – 2018-09-22 (×3): 20 mg via ORAL
  Filled 2018-09-20 (×3): qty 1

## 2018-09-20 MED ORDER — BUPIVACAINE IN DEXTROSE 0.75-8.25 % IT SOLN
INTRATHECAL | Status: DC | PRN
  Administered 2018-09-20: 1.8 mL via INTRATHECAL

## 2018-09-20 MED ORDER — SODIUM CHLORIDE 0.9 % IV SOLN
INTRAVENOUS | Status: DC | PRN
  Administered 2018-09-20: 40 ug/min via INTRAVENOUS

## 2018-09-20 MED ORDER — BUPIVACAINE-EPINEPHRINE 0.5% -1:200000 IJ SOLN
INTRAMUSCULAR | Status: DC | PRN
  Administered 2018-09-20: 50 mL

## 2018-09-20 MED ORDER — METOCLOPRAMIDE HCL 5 MG/ML IJ SOLN: 10.0000 mg | Freq: Once | INTRAMUSCULAR | Status: DC | PRN

## 2018-09-20 MED ORDER — BUPIVACAINE-EPINEPHRINE 0.5% -1:200000 IJ SOLN
INTRAMUSCULAR | Status: AC
  Filled 2018-09-20: qty 1

## 2018-09-20 MED ORDER — ALUM & MAG HYDROXIDE-SIMETH 200-200-20 MG/5ML PO SUSP
30.0000 mL | ORAL | Status: DC | PRN
  Administered 2018-09-22: 30 mL via ORAL
  Filled 2018-09-20 (×3): qty 30

## 2018-09-20 MED ORDER — OXYCODONE-ACETAMINOPHEN 5-325 MG PO TABS: 1.0000 | ORAL_TABLET | Freq: Four times a day (QID) | ORAL | 0 refills | Status: AC | PRN

## 2018-09-20 MED ORDER — CELECOXIB 200 MG PO CAPS
200.0000 mg | ORAL_CAPSULE | Freq: Two times a day (BID) | ORAL | Status: DC
  Administered 2018-09-20 – 2018-09-22 (×4): 200 mg via ORAL
  Filled 2018-09-20 (×4): qty 1

## 2018-09-20 MED ORDER — SODIUM CHLORIDE 0.9% FLUSH
INTRAVENOUS | Status: DC | PRN
  Administered 2018-09-20: 50 mL

## 2018-09-20 MED ORDER — DEXAMETHASONE SODIUM PHOSPHATE 10 MG/ML IJ SOLN
10.0000 mg | Freq: Two times a day (BID) | INTRAMUSCULAR | Status: AC
  Administered 2018-09-21 – 2018-09-22 (×3): 10 mg via INTRAVENOUS
  Filled 2018-09-20 (×3): qty 1

## 2018-09-20 MED ORDER — TRANEXAMIC ACID-NACL 1000-0.7 MG/100ML-% IV SOLN
1000.0000 mg | Freq: Once | INTRAVENOUS | Status: AC
  Administered 2018-09-20: 15:00:00 1000 mg via INTRAVENOUS
  Filled 2018-09-20: qty 100

## 2018-09-20 MED ORDER — MAGNESIUM CITRATE PO SOLN: 1.0000 | Freq: Once | ORAL | Status: DC | PRN

## 2018-09-20 MED ORDER — SODIUM CHLORIDE (PF) 0.9 % IJ SOLN
INTRAMUSCULAR | Status: AC
  Filled 2018-09-20: qty 50

## 2018-09-20 MED ORDER — BUPIVACAINE LIPOSOME 1.3 % IJ SUSP
INTRAMUSCULAR | Status: DC | PRN
  Administered 2018-09-20: 20 mL

## 2018-09-20 MED ORDER — PROPOFOL 500 MG/50ML IV EMUL
INTRAVENOUS | Status: DC | PRN
  Administered 2018-09-20: 50 ug/kg/min via INTRAVENOUS

## 2018-09-20 MED ORDER — LACTATED RINGERS IV SOLN
INTRAVENOUS | Status: DC
  Administered 2018-09-20 (×3): via INTRAVENOUS

## 2018-09-20 MED ORDER — POLYETHYLENE GLYCOL 3350 17 G PO PACK
17.0000 g | PACK | Freq: Every day | ORAL | Status: DC | PRN
  Administered 2018-09-21: 17 g via ORAL
  Filled 2018-09-20: qty 1

## 2018-09-20 MED ORDER — SODIUM CHLORIDE 0.9 % IR SOLN
Status: DC | PRN
  Administered 2018-09-20: 1000 mL

## 2018-09-20 MED ORDER — HYDROCHLOROTHIAZIDE 25 MG PO TABS
25.0000 mg | ORAL_TABLET | Freq: Every day | ORAL | Status: DC
  Administered 2018-09-20 – 2018-09-22 (×3): 25 mg via ORAL
  Filled 2018-09-20 (×3): qty 1

## 2018-09-20 MED ORDER — ROPIVACAINE HCL 5 MG/ML IJ SOLN
INTRAMUSCULAR | Status: DC | PRN
  Administered 2018-09-20: 30 mL via PERINEURAL

## 2018-09-20 MED ORDER — DOCUSATE SODIUM 100 MG PO CAPS
100.0000 mg | ORAL_CAPSULE | Freq: Two times a day (BID) | ORAL | Status: DC
  Administered 2018-09-20 – 2018-09-22 (×4): 100 mg via ORAL
  Filled 2018-09-20 (×4): qty 1

## 2018-09-20 MED ORDER — OXYCODONE HCL 5 MG PO TABS
5.0000 mg | ORAL_TABLET | ORAL | Status: DC | PRN
  Administered 2018-09-20 – 2018-09-22 (×10): 10 mg via ORAL
  Filled 2018-09-20 (×10): qty 2

## 2018-09-20 MED ORDER — FENTANYL CITRATE (PF) 100 MCG/2ML IJ SOLN
50.0000 ug | INTRAMUSCULAR | Status: DC
  Administered 2018-09-20: 100 ug via INTRAVENOUS
  Filled 2018-09-20: qty 2

## 2018-09-20 MED ORDER — LISINOPRIL-HYDROCHLOROTHIAZIDE 20-25 MG PO TABS: 1.0000 | ORAL_TABLET | Freq: Every day | ORAL | Status: DC

## 2018-09-20 MED ORDER — TRANEXAMIC ACID-NACL 1000-0.7 MG/100ML-% IV SOLN
1000.0000 mg | INTRAVENOUS | Status: AC
  Administered 2018-09-20: 1000 mg via INTRAVENOUS
  Filled 2018-09-20: qty 100

## 2018-09-20 MED ORDER — SODIUM CHLORIDE 0.9 % IV SOLN
INTRAVENOUS | Status: DC
  Administered 2018-09-20: 19:00:00 via INTRAVENOUS

## 2018-09-20 MED ORDER — ONDANSETRON HCL 4 MG/2ML IJ SOLN: 4.0000 mg | Freq: Four times a day (QID) | INTRAMUSCULAR | Status: DC | PRN

## 2018-09-20 MED ORDER — PROPOFOL 10 MG/ML IV BOLUS
INTRAVENOUS | Status: DC | PRN
  Administered 2018-09-20 (×3): 20 mg via INTRAVENOUS

## 2018-09-20 MED ORDER — CEFAZOLIN SODIUM-DEXTROSE 2-4 GM/100ML-% IV SOLN
2.0000 g | INTRAVENOUS | Status: AC
  Administered 2018-09-20: 10:00:00 2 g via INTRAVENOUS
  Filled 2018-09-20: qty 100

## 2018-09-20 MED ORDER — ACETAMINOPHEN 325 MG PO TABS
325.0000 mg | ORAL_TABLET | Freq: Four times a day (QID) | ORAL | Status: DC | PRN
  Administered 2018-09-21 (×2): 650 mg via ORAL
  Filled 2018-09-20 (×2): qty 2

## 2018-09-20 MED ORDER — DOCUSATE SODIUM 100 MG PO CAPS: 100.0000 mg | ORAL_CAPSULE | Freq: Two times a day (BID) | ORAL | 0 refills | Status: DC

## 2018-09-20 MED ORDER — TIZANIDINE HCL 2 MG PO TABS: 2.0000 mg | ORAL_TABLET | Freq: Three times a day (TID) | ORAL | 0 refills | Status: AC | PRN

## 2018-09-20 MED ORDER — CHLORHEXIDINE GLUCONATE 4 % EX LIQD: 60.0000 mL | Freq: Once | CUTANEOUS | Status: DC

## 2018-09-20 MED ORDER — 0.9 % SODIUM CHLORIDE (POUR BTL) OPTIME
TOPICAL | Status: DC | PRN
  Administered 2018-09-20: 1000 mL

## 2018-09-20 MED ORDER — FENTANYL CITRATE (PF) 100 MCG/2ML IJ SOLN: 25.0000 ug | INTRAMUSCULAR | Status: DC | PRN

## 2018-09-20 MED ORDER — ASPIRIN EC 325 MG PO TBEC
325.0000 mg | DELAYED_RELEASE_TABLET | Freq: Two times a day (BID) | ORAL | Status: DC
  Administered 2018-09-20 – 2018-09-22 (×4): 325 mg via ORAL
  Filled 2018-09-20 (×4): qty 1

## 2018-09-20 MED ORDER — HYDROMORPHONE HCL 1 MG/ML IJ SOLN: 0.5000 mg | INTRAMUSCULAR | Status: DC | PRN

## 2018-09-20 MED ORDER — POVIDONE-IODINE 10 % EX SWAB
2.0000 "application " | Freq: Once | CUTANEOUS | Status: AC
  Administered 2018-09-20: 2 via TOPICAL

## 2018-09-20 MED ORDER — ONDANSETRON HCL 4 MG/2ML IJ SOLN
INTRAMUSCULAR | Status: DC | PRN
  Administered 2018-09-20: 4 mg via INTRAVENOUS

## 2018-09-20 MED ORDER — METHOCARBAMOL 500 MG IVPB - SIMPLE MED
500.0000 mg | Freq: Four times a day (QID) | INTRAVENOUS | Status: DC | PRN
  Filled 2018-09-20: qty 50

## 2018-09-20 MED ORDER — ONDANSETRON HCL 4 MG PO TABS
4.0000 mg | ORAL_TABLET | Freq: Four times a day (QID) | ORAL | Status: DC | PRN
  Administered 2018-09-20: 4 mg via ORAL
  Filled 2018-09-20 (×2): qty 1

## 2018-09-20 MED ORDER — PROPOFOL 10 MG/ML IV BOLUS
INTRAVENOUS | Status: AC
  Filled 2018-09-20: qty 60

## 2018-09-20 MED ORDER — BISACODYL 5 MG PO TBEC: 5.0000 mg | DELAYED_RELEASE_TABLET | Freq: Every day | ORAL | Status: DC | PRN

## 2018-09-20 MED ORDER — CEFAZOLIN SODIUM-DEXTROSE 2-4 GM/100ML-% IV SOLN
2.0000 g | Freq: Four times a day (QID) | INTRAVENOUS | Status: AC
  Administered 2018-09-20 (×2): 2 g via INTRAVENOUS
  Filled 2018-09-20 (×2): qty 100

## 2018-09-20 MED ORDER — MEPERIDINE HCL 50 MG/ML IJ SOLN: 6.2500 mg | INTRAMUSCULAR | Status: DC | PRN

## 2018-09-20 MED ORDER — DIPHENHYDRAMINE HCL 12.5 MG/5ML PO ELIX
12.5000 mg | ORAL_SOLUTION | ORAL | Status: DC | PRN
  Administered 2018-09-20: 12.5 mg via ORAL
  Filled 2018-09-20: qty 5

## 2018-09-20 SURGICAL SUPPLY — 53 items
ATTUNE MED DOME PAT 32 KNEE (Knees) ×2 IMPLANT
ATTUNE MED DOME PAT 32MM KNEE (Knees) ×1 IMPLANT
ATTUNE PSFEM RTSZ5 NARCEM KNEE (Femur) ×3 IMPLANT
ATTUNE PSRP INSE SZ 5 7MM KNEE (Insert) ×1 IMPLANT
ATTUNE PSRP INSE SZ5 7 KNEE (Insert) ×2 IMPLANT
BAG ZIPLOCK 12X15 (MISCELLANEOUS) ×3 IMPLANT
BASEPLATE TIBIAL ROTATING SZ 4 (Knees) ×3 IMPLANT
BENZOIN TINCTURE PRP APPL 2/3 (GAUZE/BANDAGES/DRESSINGS) ×3 IMPLANT
BLADE SAGITTAL 25.0X1.19X90 (BLADE) ×2 IMPLANT
BLADE SAGITTAL 25.0X1.19X90MM (BLADE) ×1
BLADE SAW SGTL 11.0X1.19X90.0M (BLADE) IMPLANT
BLADE SURG SZ10 CARB STEEL (BLADE) ×3 IMPLANT
BNDG ELASTIC 6X5.8 VLCR STR LF (GAUZE/BANDAGES/DRESSINGS) ×3 IMPLANT
BOOTIES KNEE HIGH SLOAN (MISCELLANEOUS) ×3 IMPLANT
BOWL SMART MIX CTS (DISPOSABLE) ×3 IMPLANT
CEMENT HV SMART SET (Cement) ×3 IMPLANT
CLOSURE WOUND 1/2 X4 (GAUZE/BANDAGES/DRESSINGS) ×1
COVER SURGICAL LIGHT HANDLE (MISCELLANEOUS) ×3 IMPLANT
COVER WAND RF STERILE (DRAPES) ×3 IMPLANT
CUFF TOURN SGL QUICK 34 (TOURNIQUET CUFF) ×2
CUFF TRNQT CYL 34X4.125X (TOURNIQUET CUFF) ×1 IMPLANT
DECANTER SPIKE VIAL GLASS SM (MISCELLANEOUS) ×6 IMPLANT
DRAPE U-SHAPE 47X51 STRL (DRAPES) ×3 IMPLANT
DRSG AQUACEL AG ADV 3.5X10 (GAUZE/BANDAGES/DRESSINGS) ×3 IMPLANT
DURAPREP 26ML APPLICATOR (WOUND CARE) ×3 IMPLANT
ELECT REM PT RETURN 15FT ADLT (MISCELLANEOUS) ×3 IMPLANT
GLOVE BIOGEL PI IND STRL 8 (GLOVE) ×2 IMPLANT
GLOVE BIOGEL PI INDICATOR 8 (GLOVE) ×4
GLOVE ECLIPSE 7.5 STRL STRAW (GLOVE) ×6 IMPLANT
GOWN STRL REUS W/TWL XL LVL3 (GOWN DISPOSABLE) ×6 IMPLANT
HANDPIECE INTERPULSE COAX TIP (DISPOSABLE) ×2
HOLDER FOLEY CATH W/STRAP (MISCELLANEOUS) IMPLANT
HOOD PEEL AWAY FLYTE STAYCOOL (MISCELLANEOUS) ×9 IMPLANT
KIT TURNOVER KIT A (KITS) IMPLANT
MANIFOLD NEPTUNE II (INSTRUMENTS) ×3 IMPLANT
NEEDLE HYPO 22GX1.5 SAFETY (NEEDLE) ×3 IMPLANT
NS IRRIG 1000ML POUR BTL (IV SOLUTION) ×3 IMPLANT
PACK ICE MAXI GEL EZY WRAP (MISCELLANEOUS) ×3 IMPLANT
PACK TOTAL KNEE CUSTOM (KITS) ×3 IMPLANT
PADDING CAST COTTON 6X4 STRL (CAST SUPPLIES) ×3 IMPLANT
PIN STEINMAN FIXATION KNEE (PIN) ×3 IMPLANT
PIN THREADED HEADED SIGMA (PIN) ×3 IMPLANT
PROTECTOR NERVE ULNAR (MISCELLANEOUS) ×3 IMPLANT
SET HNDPC FAN SPRY TIP SCT (DISPOSABLE) ×1 IMPLANT
STRIP CLOSURE SKIN 1/2X4 (GAUZE/BANDAGES/DRESSINGS) ×2 IMPLANT
SUT MNCRL AB 3-0 PS2 18 (SUTURE) ×3 IMPLANT
SUT VIC AB 0 CT1 36 (SUTURE) ×3 IMPLANT
SUT VIC AB 1 CT1 36 (SUTURE) ×6 IMPLANT
SYR CONTROL 10ML LL (SYRINGE) ×6 IMPLANT
TRAY FOLEY MTR SLVR 16FR STAT (SET/KITS/TRAYS/PACK) ×3 IMPLANT
WATER STERILE IRR 1000ML POUR (IV SOLUTION) ×6 IMPLANT
WRAP KNEE MAXI GEL POST OP (GAUZE/BANDAGES/DRESSINGS) ×3 IMPLANT
YANKAUER SUCT BULB TIP 10FT TU (MISCELLANEOUS) ×3 IMPLANT

## 2018-09-20 NOTE — Op Note (Signed)
PATIENT ID:      Briana Ortiz  MRN:     [id number] DOB/AGE:    1950-07-11 / 68 y.o.       OPERATIVE REPORT    DATE OF PROCEDURE:  09/20/2018       PREOPERATIVE DIAGNOSIS:   SEVERE ENDSTAGE OSTEOARTHRITIS RIGHT KNEE      Estimated body mass index is 40.25 kg/m as calculated from the following:   Height as of this encounter: 5\' 1"  (1.549 m).   Weight as of this encounter: 96.6 kg.                                                        POSTOPERATIVE DIAGNOSIS:   SEVERE ENDSTAGE OSTEOARTHRITIS RIGHT KNEE                                                                      PROCEDURE:  Procedure(s): RIGHT TOTAL KNEE ARTHROPLASTY Using DepuyAttune RP implants #5n Femur, #4Tibia, 7 mm Attune RP bearing, 32 Patella     SURGEON: Alta Corning    ASSISTANT:   Jim bethune PA-C   (Present and scrubbed throughout the case, critical for assistance with exposure, retraction, instrumentation, and closure.)         ANESTHESIA: spinal, 20cc Exparel, 50cc 0.25% Marcaine  EBL: min cc  FLUID REPLACEMENT: unk cc crystaloid  Tourniquet Time: None  Drains: None  Tranexamic Acid: 1gm IV, 2gm topical  Exparel: 266mg    COMPLICATIONS:  None         INDICATIONS FOR PROCEDURE: The patient has  SEVERE ENDSTAGE OSTEOARTHRITIS RIGHT KNEE, valgus deformities, XR shows bone on bone arthritis, lateral subluxation of tibia. Patient has failed all conservative measures including anti-inflammatory medicines, narcotics, attempts at exercise and weight loss, cortisone injections and viscosupplementation.  Risks and benefits of surgery have been discussed, questions answered.   DESCRIPTION OF PROCEDURE: The patient identified by armband, received  IV antibiotics, in the holding area at [facility]. Patient taken to the operating room, appropriate anesthetic monitors were attached, and spinal anesthesia was  induced. IV Tranexamic acid was given.Tourniquet applied high to the operative thigh. Lateral post and foot  positioner applied to the table, the lower extremity was then prepped and draped in usual sterile fashion from the toes to the tourniquet. Time-out procedure was performed. The skin and subcutaneous tissue along the incision was injected with 20 cc of a mixture of Exparel and Marcaine solution, using a 20-gauge by 1-1/2 inch needle. We began the operation, with the knee flexed 130 degrees, by making the anterior midline incision starting at handbreadth above the patella going over the patella 1 cm medial to and 4 cm distal to the tibial tubercle. Small bleeders in the skin and the subcutaneous tissue identified and cauterized. Transverse retinaculum was incised and reflected medially and a medial parapatellar arthrotomy was accomplished. the patella was everted and theprepatellar fat pad resected. The superficial medial collateral ligament was then elevated from anterior to posterior along the proximal flare of the tibia and anterior half of the menisci resected.  The knee was hyperflexed exposing bone on bone arthritis. Peripheral and notch osteophytes as well as the cruciate ligaments were then resected. We continued to work our way around posteriorly along the proximal tibia, and externally rotated the tibia subluxing it out from underneath the femur. A McHale retractor was placed through the notch and a lateral Hohmann retractor placed, and we then drilled through the proximal tibia in line with the axis of the tibia followed by an intramedullary guide rod and 2-degree posterior slope cutting guide. The tibial cutting guide, 4 degree posterior sloped, was pinned into place allowing resection of 4 mm of bone medially and 6 mm of bone laterally. Satisfied with the tibial resection, we then entered the distal femur 2 mm anterior to the PCL origin with the intramedullary guide rod and applied the distal femoral cutting guide set at 9 mm, with 5 degrees of valgus. This was pinned along the epicondylar axis. At this  point, the distal femoral cut was accomplished without difficulty. We then sized for a #5 femoral component and pinned the guide in 7+ degrees of external rotation. The chamfer cutting guide was pinned into place. The anterior, posterior, and chamfer cuts were accomplished without difficulty followed by the Attune RP box cutting guide and the box cut. We also removed posterior osteophytes from the posterior femoral condyles. The posterior capsule was injected with Exparel solution. The knee was brought into full extension. We checked our extension gap and fit a 7 mm bearing. Distracting in extension with a lamina spreader,  bleeders in the posterior capsule, Posterior medial and posterior lateral right down and cauterized.  The transexamic acid-soaked sponge was then placed in the gap of the knee and extension. The knee was flexed 30. The posterior patella cut was accomplished with the 9.5 mm Attune cutting guide, sized for a 63mm dome, and the fixation pegs drilled.The knee was then once again hyperflexed exposing the proximal tibia. We sized for a # 4 tibial base plate, applied the smokestack and the conical reamer followed by the the Delta fin keel punch. We then hammered into place the Attune RP trial femoral component, drilled the lugs, inserted a  5mm mm trial bearing, trial patellar button, and took the knee through range of motion from 0-130 degrees. Medial and lateral ligamentous stability was checked. No thumb pressure was required for patellar Tracking. The tourniquet was @48  min. All trial components were removed, mating surfaces irrigated with pulse lavage, and dried with suction and sponges. 10 cc of the Exparel solution was applied to the cancellus bone of the patella distal femur and proximal tibia.  After waiting 30 seconds, the bony surfaces were again, dried with sponges. A double batch of DePuy HV cement was mixed and applied to all bony metallic mating surfaces except for the posterior condyles  of the femur itself. In order, we hammered into place the tibial tray and removed excess cement, the femoral component and removed excess cement. The final Attune RP bearing was inserted, and the knee brought to full extension with compression. The patellar button was clamped into place, and excess cement removed. The knee was held at 30 flexion with compression, while the cement cured. The wound was irrigated out with normal saline solution pulse lavage. The rest of the Exparel was injected into the parapatellar arthrotomy, subcutaneous tissues, and periosteal tissues. The parapatellar arthrotomy was closed with running #1 Vicryl suture. The subcutaneous tissue with 0 and 2-0 undyed Vicryl suture, and the skin with running  3-0 SQ vicryl. An Aquacil and Ace wrap were applied. The patient was taken to recovery room without difficulty.   Alta Corning 09/20/2018, 11:44 AM

## 2018-09-20 NOTE — Evaluation (Signed)
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  Pt should progress to dc home with family assist.    Follow Up Recommendations Follow surgeon's recommendation for DC plan and follow-up therapies;Home health PT    Equipment Recommendations  Rolling walker with 5" wheels;3in1 (PT)    Recommendations for Other Services       Precautions / Restrictions Precautions Precautions: Knee;Fall Restrictions Weight Bearing Restrictions: No Other Position/Activity Restrictions: WBAT      Mobility  Bed Mobility Overal bed mobility: Needs Assistance Bed Mobility: Supine to Sit     Supine to sit: Min assist;Mod assist     General bed mobility comments: cues for sequence and use of L LE to self assist;  Physical assist to manage R LE and to bring trunk to upright sitting  Transfers Overall transfer level: Needs assistance Equipment used: Rolling walker (2 wheeled) Transfers: Sit to/from Stand Sit to Stand: Min assist;Mod assist         General transfer comment: cues for LE management and use of UEs to self assist  Ambulation/Gait Ambulation/Gait assistance: Min assist;Mod assist Gait Distance (Feet): 36 Feet Assistive device: Rolling walker (2 wheeled) Gait Pattern/deviations: Step-to pattern;Decreased step length - right;Decreased step length - left;Shuffle;Trunk flexed Gait velocity: decr   General Gait Details: cues for sequence, posture and position from [employer]            Wheelchair Mobility    Modified Rankin (Stroke Patients Only)       Balance Overall balance assessment: Needs assistance Sitting-balance support: Feet supported;No upper extremity supported Sitting balance-Leahy Scale:  Good     Standing balance support: Bilateral upper extremity supported Standing balance-Leahy Scale: Poor                               Pertinent Vitals/Pain Pain Assessment: 0-10 Pain Score: 5  Pain Location: R knee Pain Descriptors / Indicators: Aching;Sore Pain Intervention(s): Limited activity within patient's tolerance;Monitored during session;Premedicated before session;Ice applied    Home Living Family/patient expects to be discharged to:: Private residence Living Arrangements: Children Available Help at Discharge: Family Type of Home: House Home Access: Stairs to enter   Technical brewer of Steps: 1 Home Layout: Two level Home Equipment: None Additional Comments: pt will be staying at dtr's home as described above    Prior Function Level of Independence: Independent               Hand Dominance        Extremity/Trunk Assessment   Upper Extremity Assessment Upper Extremity Assessment: Overall WFL for tasks assessed    Lower Extremity Assessment Lower Extremity Assessment: RLE deficits/detail    Cervical / Trunk Assessment Cervical / Trunk Assessment: Normal  Communication   Communication: No difficulties  Cognition Arousal/Alertness: Awake/alert Behavior During Therapy: WFL for tasks assessed/performed Overall Cognitive Status: Within Functional Limits for tasks assessed                                        General Comments      Exercises Total Joint  Exercises Ankle Circles/Pumps: AROM;Both;15 reps;Supine   Assessment/Plan    PT Assessment Patient needs continued PT services  PT Problem List Decreased strength;Decreased range of motion;Decreased activity tolerance;Decreased mobility;Decreased balance;Decreased knowledge of use of DME;Obesity;Pain       PT Treatment Interventions DME instruction;Gait training;Stair training;Functional mobility training;Therapeutic activities;Therapeutic  exercise;Patient/family education    PT Goals (Current goals can be found in the Care Plan section)  Acute Rehab PT Goals Patient Stated Goal: Regain IND PT Goal Formulation: With patient Time For Goal Achievement: 09/27/18 Potential to Achieve Goals: Good    Frequency 7X/week   Barriers to discharge        Co-evaluation               AM-PAC PT "6 Clicks" Mobility  Outcome Measure Help needed turning from your back to your side while in a flat bed without using bedrails?: A Lot Help needed moving from lying on your back to sitting on the side of a flat bed without using bedrails?: A Lot Help needed moving to and from a bed to a chair (including a wheelchair)?: A Lot Help needed standing up from a chair using your arms (e.g., wheelchair or bedside chair)?: A Lot Help needed to walk in hospital room?: A Lot Help needed climbing 3-5 steps with a railing? : A Lot 6 Click Score: 12    End of Session Equipment Utilized During Treatment: Gait belt Activity Tolerance: Patient tolerated treatment well Patient left: in chair;with call bell/phone within reach;with chair alarm set Nurse Communication: Mobility status PT Visit Diagnosis: Difficulty in walking, not elsewhere classified (R26.2)    Time: [id number] PT Time Calculation (min) (ACUTE ONLY): 29 min   Charges:   PT Evaluation $PT Eval Low Complexity: 1 Low PT Treatments $Gait Training: 8-22 mins        [place] Pager [phone] Office [phone]   Beckham Capistran 09/20/2018, 6:29 PM

## 2018-09-20 NOTE — Discharge Instructions (Signed)

## 2018-09-20 NOTE — Anesthesia Procedure Notes (Signed)
Anesthesia Regional Block: Adductor canal block   Pre-Anesthetic Checklist: ,, timeout performed, Correct Patient, Correct Site, Correct Laterality, Correct Procedure, Correct Position, site marked, Risks and benefits discussed,  Surgical consent,  Pre-op evaluation,  At surgeon's request and post-op pain management  Laterality: Right and Lower  Prep: Maximum Sterile Barrier Precautions used, chloraprep       Needles:  Injection technique: Single-shot  Needle Type: Echogenic Stimulator Needle     Needle Length: 10cm      Additional Needles:   Procedures:,,,, ultrasound used (permanent image in chart),,,,  Narrative:  Start time: 09/20/2018 8:55 AM End time: 09/20/2018 9:02 AM Injection made incrementally with aspirations every 5 mL.  Performed by: Personally  Anesthesiologist: Montez Hageman, MD  Additional Notes: Risks, benefits and alternative to block explained extensively.  Patient tolerated procedure well, without complications.

## 2018-09-20 NOTE — Anesthesia Postprocedure Evaluation (Signed)
Anesthesia Post Note  Patient: Briana Ortiz  Procedure(s) Performed: RIGHT TOTAL KNEE ARTHROPLASTY (Right Knee)     Patient location during evaluation: PACU Anesthesia Type: Spinal Level of consciousness: awake and alert Pain management: pain level controlled Vital Signs Assessment: post-procedure vital signs reviewed and stable Respiratory status: spontaneous breathing and respiratory function stable Cardiovascular status: blood pressure returned to baseline and stable Postop Assessment: no headache, no backache, spinal receding and no apparent nausea or vomiting Anesthetic complications: no    Last Vitals:  Vitals:   09/20/18 1230 09/20/18 1245  BP: (!) 139/95 (!) 137/95  Pulse: (!) 59 62  Resp: 13 14  Temp:    SpO2: 100% 100%    Last Pain:  Vitals:   09/20/18 1245  TempSrc:   PainSc: 0-No pain                 Montez Hageman

## 2018-09-20 NOTE — H&P (Signed)
TOTAL KNEE ADMISSION H&P  Patient is being admitted for right total knee arthroplasty.  Subjective:  Chief Complaint:right knee pain.  HPI: Briana Ortiz, 68 y.o. female, has a history of pain and functional disability in the right knee due to arthritis and has failed non-surgical conservative treatments for greater than 12 weeks to includeNSAID's and/or analgesics, viscosupplementation injections, flexibility and strengthening excercises, weight reduction as appropriate and activity modification.  Onset of symptoms was gradual, starting 5 years ago with gradually worsening course since that time. The patient noted prior procedures on the knee to include  arthroscopy and menisectomy on the right knee(s).  Patient currently rates pain in the right knee(s) at 8 out of 10 with activity. Patient has night pain, worsening of pain with activity and weight bearing, pain that interferes with activities of daily living, pain with passive range of motion, crepitus and joint swelling.  Patient has evidence of periarticular osteophytes, joint subluxation and joint space narrowing by imaging studies. This patient has had Failure of all reasonable conservative care. There is no active infection.  Patient Active Problem List   Diagnosis Date Noted  . Bradycardia 03/02/2017  . Nail abnormality 07/24/2016  . Acute viral sinusitis 07/24/2016  . Hemarthrosis of right knee 05/26/2016  . Biceps tendinitis 01/13/2016  . Pruritic rash 08/26/2015  . Chronic back pain 05/27/2014  . Uterine fibroid 01/21/2014  . Health care maintenance 09/12/2013  . Left hand pain 08/15/2012  . Osteoarthritis of left hip 03/14/2011  . Essential hypertension 12/09/2005  . GERD 12/09/2005   Past Medical History:  Diagnosis Date  . Allergies   . Arthritis   . Dysrhythmia   . GERD (gastroesophageal reflux disease)   . Hypertension   . Uterine fibroid     Past Surgical History:  Procedure Laterality Date  . HEMORRHOID  SURGERY    . TONSILLECTOMY    . TOTAL HIP ARTHROPLASTY Left 03/15/2012   Procedure: TOTAL HIP ARTHROPLASTY ANTERIOR APPROACH;  Surgeon: Alta Corning, MD;  Location: [place];  Service: Orthopedics;  Laterality: Left;  . TUBAL LIGATION      Current Facility-Administered Medications  Medication Dose Route Frequency Provider Last Rate Last Dose  . bupivacaine liposome (EXPAREL) 1.3 % injection 266 mg  20 mL Other Once Dorna Leitz, MD      . ceFAZolin (ANCEF) IVPB 2g/100 mL premix  2 g Intravenous On Call to OR Dorna Leitz, MD      . chlorhexidine (HIBICLENS) 4 % liquid 4 application  60 mL Topical Once Dorna Leitz, MD      . fentaNYL (SUBLIMAZE) injection 50-100 mcg  50-100 mcg Intravenous Oneal Deputy Montez Hageman, MD      . lactated ringers infusion   Intravenous Continuous Montez Hageman, MD 50 mL/hr at 09/20/18 [phone]    . midazolam (VERSED) injection 1-2 mg  1-2 mg Intravenous UD Montez Hageman, MD      . tranexamic acid (CYKLOKAPRON) IVPB 1,000 mg  1,000 mg Intravenous To OR Dorna Leitz, MD       No Known Allergies  Social History   Tobacco Use  . Smoking status: Never Smoker  . Smokeless tobacco: Never Used  Substance Use Topics  . Alcohol use: Yes    Alcohol/week: 5.0 - 6.0 standard drinks    Types: 5 - 6 Cans of beer per week    Comment: Occasionally.Marland Kitchen 09-18-2018 "once a week"     Family History  Problem Relation Age of Onset  . Alcohol abuse Mother   .  Breast cancer Neg Hx   . Colon cancer Neg Hx   . Ovarian cancer Neg Hx   . Stroke Neg Hx      ROS ROS: I have reviewed the patient's review of systems thoroughly and there are no positive responses as relates to the HPI. Objective:  Physical Exam  Vital signs in last 24 hours: Temp:  [98 F (36.7 C)-98.2 F (36.8 C)] 98 F (36.7 C) (08/21 0705) Pulse Rate:  [62-64] 62 (08/21 0705) Resp:  [16] 16 (08/21 0705) BP: (159-172)/(95) 159/95 (08/21 0705) SpO2:  [100 %] 100 % (08/21 0705) Weight:  [96.6 kg] 96.6 kg (08/21  0721) Well-developed well-nourished patient in no acute distress. Alert and oriented x3 HEENT:within normal limits Cardiac: Regular rate and rhythm Pulmonary: Lungs clear to auscultation Abdomen: Soft and nontender.  Normal active bowel sounds  Musculoskeletal: (Right knee: Painful range of motion.  Limited range of motion.  Trace effusion.  No instability.) Labs: Recent Results (from the past 2160 hour(s))  APTT     Status: None   Collection Time: 09/18/18  2:15 PM  Result Value Ref Range   aPTT 29 24 - 36 seconds    Comment: Performed at [facility], [place] [address], [place], [place] [address]  CBC WITH DIFFERENTIAL     Status: None   Collection Time: 09/18/18  2:15 PM  Result Value Ref Range   WBC 7.7 4.0 - 10.5 K/uL   RBC 4.65 3.87 - 5.11 MIL/uL   Hemoglobin 14.1 12.0 - 15.0 g/dL   HCT 45.1 36.0 - 46.0 %   MCV 97.0 80.0 - 100.0 fL   MCH 30.3 26.0 - 34.0 pg   MCHC 31.3 30.0 - 36.0 g/dL   RDW 12.9 11.5 - 15.5 %   Platelets 210 150 - 400 K/uL   nRBC 0.0 0.0 - 0.2 %   Neutrophils Relative % 70 %   Neutro Abs 5.3 1.7 - 7.7 K/uL   Lymphocytes Relative 25 %   Lymphs Abs 1.9 0.7 - 4.0 K/uL   Monocytes Relative 3 %   Monocytes Absolute 0.2 0.1 - 1.0 K/uL   Eosinophils Relative 2 %   Eosinophils Absolute 0.2 0.0 - 0.5 K/uL   Basophils Relative 0 %   Basophils Absolute 0.0 0.0 - 0.1 K/uL   Immature Granulocytes 0 %   Abs Immature Granulocytes 0.03 0.00 - 0.07 K/uL    Comment: Performed at [facility], [place] [address] [place], [place] [address]  Comprehensive metabolic panel     Status: Abnormal   Collection Time: 09/18/18  2:15 PM  Result Value Ref Range   Sodium 140 135 - 145 mmol/L   Potassium 3.8 3.5 - 5.1 mmol/L   Chloride 101 98 - 111 mmol/L   CO2 29 22 - 32 mmol/L   Glucose, Bld 93 70 - 99 mg/dL   BUN 19 8 - 23 mg/dL   Creatinine, Ser 1.18 (H) 0.44 - 1.00 mg/dL   Calcium 9.4 8.9 - 10.3 mg/dL   Total Protein 7.9 6.5 - 8.1 g/dL    Albumin 4.2 3.5 - 5.0 g/dL   AST 24 15 - 41 U/L   ALT 21 0 - 44 U/L   Alkaline Phosphatase 77 38 - 126 U/L   Total Bilirubin 0.5 0.3 - 1.2 mg/dL   GFR calc non Af Amer 48 (L) >60 mL/min   GFR calc Af Amer 56 (L) >60 mL/min   Anion gap 10 5 -  15    Comment: Performed at [facility], [place] [address] [place], [place] [address]  Protime-INR     Status: None   Collection Time: 09/18/18  2:15 PM  Result Value Ref Range   Prothrombin Time 12.0 11.4 - 15.2 seconds   INR 0.9 0.8 - 1.2    Comment: (NOTE) INR goal varies based on device and disease states. Performed at [facility], [place] [address] [place], [place] [address]   Urinalysis, Routine w reflex microscopic     Status: Abnormal   Collection Time: 09/18/18  2:15 PM  Result Value Ref Range   Color, Urine YELLOW YELLOW   APPearance CLEAR CLEAR   Specific Gravity, Urine 1.020 1.005 - 1.030   pH 5.0 5.0 - 8.0   Glucose, UA NEGATIVE NEGATIVE mg/dL   Hgb urine dipstick NEGATIVE NEGATIVE   Bilirubin Urine NEGATIVE NEGATIVE   Ketones, ur NEGATIVE NEGATIVE mg/dL   Protein, ur NEGATIVE NEGATIVE mg/dL   Nitrite NEGATIVE NEGATIVE   Leukocytes,Ua SMALL (A) NEGATIVE   RBC / HPF 0-5 0 - 5 RBC/hpf   WBC, UA 6-10 0 - 5 WBC/hpf   Bacteria, UA NONE SEEN NONE SEEN   Squamous Epithelial / LPF 0-5 0 - 5    Comment: Performed at [facility], [place] [address], [place],  [address]  Surgical pcr screen     Status: None   Collection Time: 09/18/18  2:15 PM   Specimen: Nasal Mucosa; Nasal Swab  Result Value Ref Range   MRSA, PCR NEGATIVE NEGATIVE   Staphylococcus aureus NEGATIVE NEGATIVE    Comment: (NOTE) The Xpert SA Assay (FDA approved for NASAL specimens in patients 82 years of age and older), is one component of a comprehensive surveillance program. It is not intended to diagnose infection nor to guide or monitor treatment. Performed at [facility], [place]  [address], [place],  [address]   SARS Coronavirus 2 [facility] order, Performed in [facility] hospital lab) Nasopharyngeal Nasopharyngeal Swab     Status: None   Collection Time: 09/20/18  6:54 AM   Specimen: Nasopharyngeal Swab  Result Value Ref Range   SARS Coronavirus 2 NEGATIVE NEGATIVE    Comment: (NOTE) If result is NEGATIVE SARS-CoV-2 target nucleic acids are NOT DETECTED. The SARS-CoV-2 RNA is generally detectable in upper and lower  respiratory specimens during the acute phase of infection. The lowest  concentration of SARS-CoV-2 viral copies this assay can detect is 250  copies / mL. A negative result does not preclude SARS-CoV-2 infection  and should not be used as the sole basis for treatment or other  patient management decisions.  A negative result may occur with  improper specimen collection / handling, submission of specimen other  than nasopharyngeal swab, presence of viral mutation(s) within the  areas targeted by this assay, and inadequate number of viral copies  (<250 copies / mL). A negative result must be combined with clinical  observations, patient history, and epidemiological information. If result is POSITIVE SARS-CoV-2 target nucleic acids are DETECTED. The SARS-CoV-2 RNA is generally detectable in upper and lower  respiratory specimens dur ing the acute phase of infection.  Positive  results are indicative of active infection with SARS-CoV-2.  Clinical  correlation with patient history and other diagnostic information is  necessary to determine patient infection status.  Positive results do  not rule out bacterial infection or co-infection with other viruses. If result is PRESUMPTIVE POSTIVE SARS-CoV-2 nucleic  acids MAY BE PRESENT.   A presumptive positive result was obtained on the submitted specimen  and confirmed on repeat testing.  While 2019 novel coronavirus  (SARS-CoV-2) nucleic acids may be present in the submitted sample  additional  confirmatory testing may be necessary for epidemiological  and / or clinical management purposes  to differentiate between  SARS-CoV-2 and other Sarbecovirus currently known to infect humans.  If clinically indicated additional testing with an alternate test  methodology [phone]) is advised. The SARS-CoV-2 RNA is generally  detectable in upper and lower respiratory sp ecimens during the acute  phase of infection. The expected result is Negative. Fact Sheet for Patients:  StrictlyIdeas.no Fact Sheet for Healthcare Providers: BankingDealers.co.za This test is not yet approved or cleared by the [place] FDA and has been authorized for detection and/or diagnosis of SARS-CoV-2 by FDA under an Emergency Use Authorization (EUA).  This EUA will remain in effect (meaning this test can be used) for the duration of the COVID-19 declaration under Section 564(b)(1) of the Act, 21 U.S.C. section 360bbb-3(b)(1), unless the authorization is terminated or revoked sooner. Performed at [facility], [place] [address], [place], [place] [address]     Estimated body mass index is 40.25 kg/m as calculated from the following:   Height as of this encounter: 5\' 1"  (1.549 m).   Weight as of this encounter: 96.6 kg.   Imaging Review Plain radiographs demonstrate severe degenerative joint disease of the right knee(s). The overall alignment ismild varus. The bone quality appears to be fair for age and reported activity level.      Assessment/Plan:  End stage arthritis, right knee   The patient history, physical examination, clinical judgment of the provider and imaging studies are consistent with end stage degenerative joint disease of the right knee(s) and total knee arthroplasty is deemed medically necessary. The treatment options including medical management, injection therapy arthroscopy and arthroplasty were discussed at length. The  risks and benefits of total knee arthroplasty were presented and reviewed. The risks due to aseptic loosening, infection, stiffness, patella tracking problems, thromboembolic complications and other imponderables were discussed. The patient acknowledged the explanation, agreed to proceed with the plan and consent was signed. Patient is being admitted for inpatient treatment for surgery, pain control, PT, OT, prophylactic antibiotics, VTE prophylaxis, progressive ambulation and ADL's and discharge planning. The patient is planning to be discharged home with home health services     Patient's anticipated LOS is less than 2 midnights, meeting these requirements: - Younger than 48 - Lives within 1 hour of care - Has a competent adult at home to recover with post-op recover - NO history of  - Chronic pain requiring opiods  - Diabetes  - Coronary Artery Disease  - Heart failure  - Heart attack  - Stroke  - DVT/VTE  - Cardiac arrhythmia  - Respiratory Failure/COPD  - Renal failure  - Anemia  - Advanced Liver disease

## 2018-09-20 NOTE — Anesthesia Procedure Notes (Signed)
Spinal  Patient location during procedure: OR Start time: 09/20/2018 9:43 AM End time: 09/20/2018 9:48 AM Staffing Anesthesiologist: Montez Hageman, MD Resident/CRNA: Montel Clock, CRNA Performed: resident/CRNA  Preanesthetic Checklist Completed: patient identified, surgical consent, pre-op evaluation, timeout performed, IV checked, risks and benefits discussed and monitors and equipment checked Spinal Block Patient position: sitting Prep: DuraPrep Patient monitoring: heart rate, cardiac monitor, continuous pulse ox and blood pressure Approach: midline Location: L3-4 Injection technique: single-shot Needle Needle type: Pencan  Needle gauge: 24 G Needle length: 10 cm Needle insertion depth: 8 cm Assessment Sensory level: T4 Additional Notes Timeout performed. Positive CSF, negative heme/parasthesia.

## 2018-09-20 NOTE — Transfer of Care (Signed)
Immediate Anesthesia Transfer of Care Note  Patient: Briana Ortiz  Procedure(s) Performed: RIGHT TOTAL KNEE ARTHROPLASTY (Right Knee)  Patient Location: PACU  Anesthesia Type:General  Level of Consciousness: awake, alert  and oriented  Airway & Oxygen Therapy: Patient Spontanous Breathing and Patient connected to face mask oxygen  Post-op Assessment: Report given to RN  Post vital signs: Reviewed and stable  Last Vitals:  Vitals Value Taken Time  BP 89/60 09/20/18 1130  Temp    Pulse 60 09/20/18 1131  Resp 13 09/20/18 1131  SpO2 100 % 09/20/18 1131  Vitals shown include unvalidated device data.  Last Pain:  Vitals:   09/20/18 0721  TempSrc:   PainSc: 5       Patients Stated Pain Goal: 4 ([id number] [phone])  Complications: No apparent anesthesia complications

## 2018-09-20 NOTE — Anesthesia Preprocedure Evaluation (Signed)
Anesthesia Evaluation  Patient identified by MRN, date of birth, ID band Patient awake    Reviewed: Allergy & Precautions, NPO status , Patient's Chart, lab work & pertinent test results  Airway Mallampati: II  TM Distance: >3 FB Neck ROM: Full    Dental no notable dental hx.    Pulmonary neg pulmonary ROS,    Pulmonary exam normal breath sounds clear to auscultation       Cardiovascular hypertension, Pt. on medications Normal cardiovascular exam Rhythm:Regular Rate:Normal     Neuro/Psych negative neurological ROS  negative psych ROS   GI/Hepatic negative GI ROS, Neg liver ROS,   Endo/Other  negative endocrine ROS  Renal/GU negative Renal ROS  negative genitourinary   Musculoskeletal negative musculoskeletal ROS (+)   Abdominal   Peds negative pediatric ROS (+)  Hematology negative hematology ROS (+)   Anesthesia Other Findings   Reproductive/Obstetrics negative OB ROS                             Anesthesia Physical Anesthesia Plan  ASA: II  Anesthesia Plan: Spinal   Post-op Pain Management:  Regional for Post-op pain   Induction: Intravenous  PONV Risk Score and Plan: 2 and Ondansetron and Treatment may vary due to age or medical condition  Airway Management Planned: Simple Face Mask  Additional Equipment:   Intra-op Plan:   Post-operative Plan:   Informed Consent: I have reviewed the patients History and Physical, chart, labs and discussed the procedure including the risks, benefits and alternatives for the proposed anesthesia with the patient or authorized representative who has indicated his/her understanding and acceptance.     Dental advisory given  Plan Discussed with: CRNA  Anesthesia Plan Comments:         Anesthesia Quick Evaluation

## 2018-09-20 NOTE — Progress Notes (Signed)
AssistedDr. Carignan with right, ultrasound guided, adductor canal block. Side rails up, monitors on throughout procedure. See vital signs in flow sheet. Tolerated Procedure well.  

## 2018-09-21 DIAGNOSIS — K219 Gastro-esophageal reflux disease without esophagitis: Secondary | ICD-10-CM | POA: Diagnosis not present

## 2018-09-21 DIAGNOSIS — M545 Low back pain: Secondary | ICD-10-CM | POA: Diagnosis not present

## 2018-09-21 DIAGNOSIS — D62 Acute posthemorrhagic anemia: Secondary | ICD-10-CM | POA: Diagnosis not present

## 2018-09-21 DIAGNOSIS — I10 Essential (primary) hypertension: Secondary | ICD-10-CM | POA: Diagnosis not present

## 2018-09-21 DIAGNOSIS — M1711 Unilateral primary osteoarthritis, right knee: Secondary | ICD-10-CM | POA: Diagnosis not present

## 2018-09-21 DIAGNOSIS — Z96642 Presence of left artificial hip joint: Secondary | ICD-10-CM | POA: Diagnosis not present

## 2018-09-21 LAB — CBC
HCT: 36.7 % (ref 36.0–46.0)
Hemoglobin: 11.5 g/dL — ABNORMAL LOW (ref 12.0–15.0)
MCH: 30.8 pg (ref 26.0–34.0)
MCHC: 31.3 g/dL (ref 30.0–36.0)
MCV: 98.4 fL (ref 80.0–100.0)
Platelets: 184 10*3/uL (ref 150–400)
RBC: 3.73 MIL/uL — ABNORMAL LOW (ref 3.87–5.11)
RDW: 13.1 % (ref 11.5–15.5)
WBC: 8.6 10*3/uL (ref 4.0–10.5)
nRBC: 0 % (ref 0.0–0.2)

## 2018-09-21 NOTE — Plan of Care (Signed)

## 2018-09-21 NOTE — Discharge Summary (Addendum)
Patient ID: Briana Ortiz MRN: [id number] DOB/AGE: 04/27/1950 68 y.o.  Admit date: 09/20/2018 Discharge date: 09/22/2018 Admission Diagnoses:  Principal Problem:   Primary osteoarthritis of right knee   Discharge Diagnoses:  Same  Past Medical History:  Diagnosis Date  . Allergies   . Arthritis   . Dysrhythmia   . GERD (gastroesophageal reflux disease)   . Hypertension   . Uterine fibroid     Surgeries: Procedure(s): RIGHT TOTAL KNEE ARTHROPLASTY on 09/20/2018   Consultants:   Discharged Condition: Improved  Hospital Course: Briana Ortiz is an 68 y.o. female who was admitted 09/20/2018 for operative treatment ofPrimary osteoarthritis of right knee. Patient has severe unremitting pain that affects sleep, daily activities, and work/hobbies. After pre-op clearance the patient was taken to the operating room on 09/20/2018 and underwent  Procedure(s): RIGHT TOTAL KNEE ARTHROPLASTY.    Patient was given perioperative antibiotics:  Anti-infectives (From admission, onward)   Start     Dose/Rate Route Frequency Ordered Stop   09/20/18 1600  ceFAZolin (ANCEF) IVPB 2g/100 mL premix     2 g 200 mL/hr over 30 Minutes Intravenous Every 6 hours 09/20/18 1322 09/20/18 2146   09/20/18 0700  ceFAZolin (ANCEF) IVPB 2g/100 mL premix     2 g 200 mL/hr over 30 Minutes Intravenous On call to O.R. 09/20/18 [id number] 09/20/18 0948       Patient was given sequential compression devices, early ambulation, and chemoprophylaxis to prevent DVT.  Patient benefited maximally from hospital stay and there were no complications.    Recent vital signs:  Patient Vitals for the past 24 hrs:  BP Temp Temp src Pulse Resp SpO2  09/21/18 0418 (!) 147/83 99.2 F (37.3 C) Oral 61 15 98 %  09/20/18 2340 108/76 98.6 F (37 C) Oral 66 16 97 %  09/20/18 2130 (!) 164/99 98 F (36.7 C) Oral 65 16 97 %  09/20/18 1753 (!) 156/99 (!) 97.4 F (36.3 C) - 63 15 98 %  09/20/18 1529 (!) 151/85 97.6 F (36.4 C) -  63 15 100 %  09/20/18 1435 (!) 158/93 (!) 97.4 F (36.3 C) - 70 16 100 %  09/20/18 1328 (!) 159/88 97.8 F (36.6 C) Oral 63 16 100 %  09/20/18 1300 111/73 - - 62 (!) 9 100 %  09/20/18 1245 (!) 137/95 - - 62 14 100 %  09/20/18 1230 (!) 139/95 - - (!) 59 13 100 %  09/20/18 1215 125/88 (!) 97.5 F (36.4 C) - (!) 59 12 100 %  09/20/18 1200 114/61 (!) 96.2 F (35.7 C) - (!) 58 11 100 %  09/20/18 1145 115/74 (!) 96.1 F (35.6 C) - (!) 56 11 99 %  09/20/18 1130 (!) 89/60 (!) 95.6 F (35.3 C) - (!) 59 15 100 %  09/20/18 0920 (!) 146/92 - - (!) 49 12 98 %  09/20/18 0910 138/84 - - (!) 56 16 100 %  09/20/18 0902 - - - (!) 58 11 100 %  09/20/18 0900 (!) 128/92 - - - - -  09/20/18 0859 - - - (!) 55 11 100 %  09/20/18 0855 (!) 149/84 - - (!) 55 10 100 %  09/20/18 0851 (!) 154/100 - - 60 18 100 %     Recent laboratory studies:  Recent Labs    09/18/18 1415 09/21/18 0220  WBC 7.7 8.6  HGB 14.1 11.5*  HCT 45.1 36.7  PLT 210 184  NA 140  --   K 3.8  --  CL 101  --   CO2 29  --   BUN 19  --   CREATININE 1.18*  --   GLUCOSE 93  --   INR 0.9  --   CALCIUM 9.4  --      Discharge Medications:   Allergies as of 09/21/2018   No Known Allergies     Medication List    STOP taking these medications   BIOFREEZE EX   diclofenac sodium 1 % Gel Commonly known as: VOLTAREN   erythromycin ophthalmic ointment   famotidine 20 MG tablet Commonly known as: PEPCID   gabapentin 600 MG tablet Commonly known as: NEURONTIN   HYDROcodone-acetaminophen 5-325 MG tablet Commonly known as: NORCO/VICODIN   ibuprofen 200 MG tablet Commonly known as: ADVIL   lidocaine 5 % Commonly known as: LIDODERM   naproxen 500 MG tablet Commonly known as: NAPROSYN   triamcinolone cream 0.1 % Commonly known as: KENALOG     TAKE these medications   aspirin EC 325 MG tablet Take 1 tablet (325 mg total) by mouth 2 (two) times daily after a meal. Take x 1 month post op to decrease risk of blood  clots.   cetirizine 10 MG tablet Commonly known as: ZyrTEC Allergy Take 1 tablet (10 mg total) by mouth daily.   docusate sodium 100 MG capsule Commonly known as: Colace Take 1 capsule (100 mg total) by mouth 2 (two) times daily.   fluticasone 50 MCG/ACT nasal spray Commonly known as: FLONASE Place 2 sprays into both nostrils daily. What changed:   when to take this  reasons to take this   lisinopril-hydrochlorothiazide 20-25 MG tablet Commonly known as: ZESTORETIC TAKE 1 TABLET BY MOUTH ONCE DAILY IN THE MORNING What changed: See the new instructions.   loratadine 10 MG dissolvable tablet Commonly known as: CLARITIN REDITABS Take 1 tablet (10 mg total) by mouth every morning.   multivitamin with minerals Tabs tablet Take 1 tablet by mouth daily.   oxyCODONE-acetaminophen 5-325 MG tablet Commonly known as: PERCOCET/ROXICET Take 1-2 tablets by mouth every 6 (six) hours as needed for severe pain. What changed:   how much to take  when to take this   tiZANidine 2 MG tablet Commonly known as: ZANAFLEX Take 1 tablet (2 mg total) by mouth every 8 (eight) hours as needed for muscle spasms.            Discharge Care Instructions  (From admission, onward)         Start     Ordered   09/21/18 0000  Weight bearing as tolerated     09/21/18 0846          Diagnostic Studies: Dg Chest 2 View  Result Date: 09/18/2018 CLINICAL DATA:  Pre-op respiratory exam for knee surgery. EXAM: CHEST - 2 VIEW COMPARISON:  03/08/2012 FINDINGS: The heart size is normal. Ectasia thoracic aorta is again noted. Mild scarring is again seen in the left midlung. No evidence of pulmonary infiltrate or edema. No evidence of pleural effusion. The visualized skeletal structures are unremarkable. IMPRESSION: Stable mild left lung scarring.  No active cardiopulmonary disease. Electronically Signed   By: Marlaine Hind M.D.   On: 09/18/2018 15:16    Disposition: Discharge disposition: 01-Home or  Self Care       Discharge Instructions    Call MD / Call 911   Complete by: As directed    If you experience chest pain or shortness of breath, CALL 911 and be transported to the  hospital emergency room.  If you develope a fever above 101 F, pus (white drainage) or increased drainage or redness at the wound, or calf pain, call your surgeon's office.   Constipation Prevention   Complete by: As directed    Drink plenty of fluids.  Prune juice may be helpful.  You may use a stool softener, such as Colace (over the counter) 100 mg twice a day.  Use MiraLax (over the counter) for constipation as needed.   Diet - low sodium heart healthy   Complete by: As directed    Driving restrictions   Complete by: As directed    No driving for 2 weeks   Increase activity slowly as tolerated   Complete by: As directed    Patient may shower   Complete by: As directed    You may shower without a dressing once there is no drainage.  Do not wash over the wound.  If drainage remains, cover wound with plastic wrap and then shower.   Weight bearing as tolerated   Complete by: As directed       Follow-up Information    Dorna Leitz, MD. Schedule an appointment as soon as possible for a visit in 2 weeks.   Specialty: Orthopedic Surgery Contact information: [address] [place] [address] [phone]            Signed: Joanell Rising 09/21/2018, 8:47 AM

## 2018-09-21 NOTE — TOC Initial Note (Addendum)
Transition of Care [facility]) - Initial/Assessment Note    Patient Details  Name: Briana Ortiz MRN: [id number] Date of Birth: Feb 06, 1950  [facility] CM/SW Contact:    Joaquin Courts, RN Phone Number: 09/21/2018, 9:25 AM  Clinical Narrative:        CM spoke with patient at bedside. [facility] [place]. Has rolling walker and 3-in-1 for home use.  Reports will stay with her daughter at d/c, address [address] [place], [place].            Expected Discharge Plan: [place] Barriers to Discharge: No Barriers Identified   Patient Goals and CMS Choice Patient states their goals for this hospitalization and ongoing recovery are:: to go home with daughter CMS Medicare.gov Compare Post Acute Care list provided to:: Patient Choice offered to / list presented to : Patient  Expected Discharge Plan and Services Expected Discharge Plan: [place]   Discharge Planning Services: CM Consult Post Acute Care Choice: [place] arrangements for the past 2 months: Single Family Home Expected Discharge Date: 09/21/18               DME Arranged: N/A DME Agency: NA       HH Arranged: PT HH Agency: [facility] (formerly [employer]) Date [place]: 09/21/18 Time [place]: [phone] Representative spoke with at [place]: Alwyn Ren  Prior Living Arrangements/Services Living arrangements for the past 2 months: [place] with:: Self Patient language and need for interpreter reviewed:: Yes Do you feel safe going back to the place where you live?: Yes      Need for Family Participation in Patient Care: Yes (Comment) Care giver support system in place?: Yes (comment)   Criminal Activity/Legal Involvement Pertinent to Current Situation/Hospitalization: No - Comment as needed  Activities of Daily Living Home Assistive Devices/Equipment: Cane (specify quad or  straight) ADL Screening (condition at time of admission) Patient's cognitive ability adequate to safely complete daily activities?: Yes Is the patient deaf or have difficulty hearing?: No Does the patient have difficulty seeing, even when wearing glasses/contacts?: No Does the patient have difficulty concentrating, remembering, or making decisions?: No Patient able to express need for assistance with ADLs?: Yes Does the patient have difficulty dressing or bathing?: No Independently performs ADLs?: Yes (appropriate for developmental age) Does the patient have difficulty walking or climbing stairs?: Yes Weakness of Legs: Right Weakness of Arms/Hands: None  Permission Sought/Granted                  Emotional Assessment Appearance:: Appears stated age Attitude/Demeanor/Rapport: Engaged Affect (typically observed): Accepting Orientation: : Oriented to Place, Oriented to  Time, Oriented to Situation, Oriented to Self   Psych Involvement: No (comment)  Admission diagnosis:  SEVERE ENDSTAGE OSTEOARTHRITIS RIGHT KNEE Patient Active Problem List   Diagnosis Date Noted  . Primary osteoarthritis of right knee 09/20/2018  . Bradycardia 03/02/2017  . Nail abnormality 07/24/2016  . Acute viral sinusitis 07/24/2016  . Hemarthrosis of right knee 05/26/2016  . Biceps tendinitis 01/13/2016  . Pruritic rash 08/26/2015  . Chronic back pain 05/27/2014  . Uterine fibroid 01/21/2014  . Health care maintenance 09/12/2013  . Left hand pain 08/15/2012  . Osteoarthritis of left hip 03/14/2011  . Essential hypertension 12/09/2005  . GERD 12/09/2005   PCP:  Eston Esters, NP Pharmacy:   [facility] [phone] -  [place], [place] [address] Phone: [phone] Fax: [phone]     Social Determinants of Health (SDOH) Interventions    Readmission Risk Interventions No flowsheet data found.

## 2018-09-21 NOTE — Progress Notes (Signed)
    Home health agencies that serve [phone].        [place] Quality of Patient Care Rating Patient Survey Summary Rating  [facility] [phone] 3 out of 5 stars 4 out of [place] [phone] 4 out of 5 stars 4 out of [place] [phone] 4  out of 5 stars 3 out of [place] [phone] 4 out of 5 stars 4 out of [place] [phone] 4 out of 5 stars 4 out of 5 stars  [facility] [place] [phone] 3  out of 5 stars 4 out of [place] [phone] 3 out of 5 stars 4 out of 5 stars  [facility] (910) [phone] 4 out of 5 stars Not Available12  [facility] [phone] 3  out of 5 stars 3 out of [place] [phone] 3  out of 5 stars 4 out of [place] [phone] 3  out of 5 stars 3 out of [place] [phone] 4  out of 5 stars 3 out of [place] number Footnote as displayed on [place]  1 This agency provides services under a federal waiver program to non-traditional, chronic long term population.  2 This agency provides services to a special needs population.  3 Not Available.  4 The number of patient episodes for this measure is too small to report.  5 This measure currently does not have data or provider has been certified/recertified for less than 6 months.  6 The national average for this measure is not provided because of state-to-state differences in data collection.  7 Medicare is not displaying rates for this measure for any home health agency, because of an issue with the data.  8 There were problems with the data and they are being corrected.  9 Zero, or very few, patients met  the survey's rules for inclusion. The scores shown, if any, reflect a very small number of surveys and may not accurately tell how an agency is doing.  10 Survey results are based on less than 12 months of data.  11 Fewer than 70 patients completed the survey. Use the scores shown, if any, with caution as the number of surveys may be too low to accurately tell how an agency is doing.  12 No survey results are available for this period.  13 Data suppressed by CMS for one or more quarters.

## 2018-09-21 NOTE — Progress Notes (Signed)
PATIENT ID: Briana Ortiz  MRN: [id number]  DOB/AGE:  66-Mar-1952 / 66 y.o.  1 Day Post-Op Procedure(s) (LRB): RIGHT TOTAL KNEE ARTHROPLASTY (Right)    PROGRESS NOTE Subjective: Patient is alert, oriented, no Nausea, no Vomiting, yes passing gas. Taking PO well. Denies SOB, Chest or Calf Pain. Using Incentive Spirometer, PAS in place. Ambulate WBAT with pt walking 36 ft with therapy, Patient reports pain as 6/10 .    Objective: Vital signs in last 24 hours: Vitals:   09/20/18 1753 09/20/18 2130 09/20/18 2340 09/21/18 0418  BP: (!) 156/99 (!) 164/99 108/76 (!) 147/83  Pulse: 63 65 66 61  Resp: 15 16 16 15   Temp: (!) 97.4 F (36.3 C) 98 F (36.7 C) 98.6 F (37 C) 99.2 F (37.3 C)  TempSrc:  Oral Oral Oral  SpO2: 98% 97% 97% 98%  Weight:      Height:          Intake/Output from previous day: I/O last 3 completed shifts: In: 3817.8 [P.O.:300; I.V.:3317.8; IV Piggyback:200] Out: 2600 [Urine:2550; Blood:50]   Intake/Output this shift: No intake/output data recorded.   LABORATORY DATA: Recent Labs    09/18/18 1415 09/21/18 0220  WBC 7.7 8.6  HGB 14.1 11.5*  HCT 45.1 36.7  PLT 210 184  NA 140  --   K 3.8  --   CL 101  --   CO2 29  --   BUN 19  --   CREATININE 1.18*  --   GLUCOSE 93  --   INR 0.9  --   CALCIUM 9.4  --     Examination: Neurologically intact Neurovascular intact Sensation intact distally Intact pulses distally Dorsiflexion/Plantar flexion intact Incision: dressing C/D/I and no drainage No cellulitis present Compartment soft}  Assessment:   1 Day Post-Op Procedure(s) (LRB): RIGHT TOTAL KNEE ARTHROPLASTY (Right) ADDITIONAL DIAGNOSIS: Expected Acute Blood Loss Anemia, Hypertension and low back pain  Patient's anticipated LOS is less than 2 midnights, meeting these requirements: - Younger than 66 - Lives within 1 hour of care - Has a competent adult at home to recover with post-op recover - NO history of  - Chronic pain requiring  opiods  - Diabetes  - Coronary Artery Disease  - Heart failure  - Heart attack  - Stroke  - DVT/VTE  - Cardiac arrhythmia  - Respiratory Failure/COPD  - Renal failure  - Anemia  - Advanced Liver disease       Plan: PT/OT WBAT, AROM and PROM  DVT Prophylaxis:  SCDx72hrs, ASA 325 mg x 4 weeks DISCHARGE PLAN: Home DISCHARGE NEEDS: HHPT, CPM, Walker and 3-in-1 comode seat     Joanell Rising 09/21/2018, 8:44 AM

## 2018-09-21 NOTE — Progress Notes (Signed)
Physical Therapy Treatment Patient Details Name: Briana Ortiz MRN: [id number] DOB: October 31, 1950 Today's Date: 09/21/2018    History of Present Illness Pt s/p R TKR and with hx of L THR    PT Comments    Pt progressing slowly and steadily with mobility and hopeful for dc tomorrow.   Follow Up Recommendations  Follow surgeon's recommendation for DC plan and follow-up therapies;Home health PT     Equipment Recommendations  Rolling walker with 5" wheels;3in1 (PT)    Recommendations for Other Services       Precautions / Restrictions Precautions Precautions: Knee;Fall Restrictions Weight Bearing Restrictions: No Other Position/Activity Restrictions: WBAT    Mobility  Bed Mobility Overal bed mobility: Needs Assistance Bed Mobility: Supine to Sit;Sit to Supine     Supine to sit: Min assist Sit to supine: Min assist   General bed mobility comments: cues for sequence and use of L LE to self assist;  Physical assist to manage R LE   Transfers Overall transfer level: Needs assistance Equipment used: Rolling walker (2 wheeled) Transfers: Sit to/from Stand Sit to Stand: Min assist         General transfer comment: cues for LE management and use of UEs to self assist  Ambulation/Gait Ambulation/Gait assistance: Min assist Gait Distance (Feet): 100 Feet Assistive device: Rolling walker (2 wheeled) Gait Pattern/deviations: Step-to pattern;Decreased step length - right;Decreased step length - left;Shuffle;Trunk flexed Gait velocity: decr   General Gait Details: cues for sequence, posture and position from [employer]             Wheelchair Mobility    Modified Rankin (Stroke Patients Only)       Balance Overall balance assessment: Needs assistance Sitting-balance support: Feet supported;No upper extremity supported Sitting balance-Leahy Scale: Good     Standing balance support: Bilateral upper extremity supported Standing balance-Leahy Scale:  Poor                              Cognition Arousal/Alertness: Awake/alert Behavior During Therapy: WFL for tasks assessed/performed Overall Cognitive Status: Within Functional Limits for tasks assessed                                        Exercises      General Comments        Pertinent Vitals/Pain Pain Assessment: 0-10 Pain Score: 5  Pain Location: R knee Pain Descriptors / Indicators: Aching;Sore Pain Intervention(s): Limited activity within patient's tolerance;Monitored during session;Premedicated before session;Ice applied    Home Living                      Prior Function            PT Goals (current goals can now be found in the care plan section) Acute Rehab PT Goals Patient Stated Goal: Regain IND PT Goal Formulation: With patient Time For Goal Achievement: 09/27/18 Potential to Achieve Goals: Good Progress towards PT goals: Progressing toward goals    Frequency    7X/week      PT Plan Current plan remains appropriate    Co-evaluation              AM-PAC PT "6 Clicks" Mobility   Outcome Measure  Help needed turning from your back to your side while in a flat bed without using  bedrails?: A Little Help needed moving from lying on your back to sitting on the side of a flat bed without using bedrails?: A Little Help needed moving to and from a bed to a chair (including a wheelchair)?: A Little Help needed standing up from a chair using your arms (e.g., wheelchair or bedside chair)?: A Little Help needed to walk in hospital room?: A Little Help needed climbing 3-5 steps with a railing? : A Lot 6 Click Score: 17    End of Session Equipment Utilized During Treatment: Gait belt Activity Tolerance: Patient tolerated treatment well Patient left: in bed;with call bell/phone within reach;with family/visitor present Nurse Communication: Mobility status PT Visit Diagnosis: Difficulty in walking, not elsewhere  classified (R26.2)     Time: [id number] PT Time Calculation (min) (ACUTE ONLY): 36 min  Charges:  $Gait Training: 23-37 mins                     [place] Pager [phone] Office [phone]    Deondrea Markos 09/21/2018, 4:54 PM

## 2018-09-21 NOTE — Progress Notes (Signed)
Physical Therapy Treatment Patient Details Name: Briana Ortiz MRN: [id number] DOB: [birth year] Today's Date: 09/21/2018    History of Present Illness Pt s/p R TKR and with hx of L THR    PT Comments    Pt cooperative and progressing slowly but steadily with mobility but requiring increased time and cueing for all tasks.     Follow Up Recommendations  Follow surgeon's recommendation for DC plan and follow-up therapies;Home health PT     Equipment Recommendations  Rolling walker with 5" wheels;3in1 (PT)    Recommendations for Other Services       Precautions / Restrictions Precautions Precautions: Knee;Fall Restrictions Weight Bearing Restrictions: No Other Position/Activity Restrictions: WBAT    Mobility  Bed Mobility Overal bed mobility: Needs Assistance Bed Mobility: Supine to Sit     Supine to sit: Min assist     General bed mobility comments: cues for sequence and use of L LE to self assist;  Physical assist to manage R LE   Transfers Overall transfer level: Needs assistance Equipment used: Rolling walker (2 wheeled) Transfers: Sit to/from Stand Sit to Stand: Min assist;Mod assist         General transfer comment: cues for LE management and use of UEs to self assist  Ambulation/Gait Ambulation/Gait assistance: Min assist Gait Distance (Feet): 64 Feet Assistive device: Rolling walker (2 wheeled) Gait Pattern/deviations: Step-to pattern;Decreased step length - right;Decreased step length - left;Shuffle;Trunk flexed Gait velocity: decr   General Gait Details: cues for sequence, posture and position from [employer]             Wheelchair Mobility    Modified Rankin (Stroke Patients Only)       Balance Overall balance assessment: Needs assistance Sitting-balance support: Feet supported;No upper extremity supported Sitting balance-Leahy Scale: Good     Standing balance support: Bilateral upper extremity supported Standing  balance-Leahy Scale: Poor                              Cognition Arousal/Alertness: Awake/alert Behavior During Therapy: WFL for tasks assessed/performed Overall Cognitive Status: Within Functional Limits for tasks assessed                                        Exercises Total Joint Exercises Ankle Circles/Pumps: AROM;Both;15 reps;Supine Quad Sets: AROM;Both;10 reps;Supine Heel Slides: AAROM;Right;15 reps;Supine Straight Leg Raises: AAROM;Right;10 reps;Supine Goniometric ROM: AAROM R knee -10 - 35    General Comments        Pertinent Vitals/Pain Pain Assessment: 0-10 Pain Score: 5  Pain Location: R knee Pain Descriptors / Indicators: Aching;Sore Pain Intervention(s): Limited activity within patient's tolerance;Monitored during session;Premedicated before session;Ice applied    Home Living                      Prior Function            PT Goals (current goals can now be found in the care plan section) Acute Rehab PT Goals Patient Stated Goal: Regain IND PT Goal Formulation: With patient Time For Goal Achievement: 09/27/18 Potential to Achieve Goals: Good Progress towards PT goals: Progressing toward goals    Frequency    7X/week      PT Plan Current plan remains appropriate    Co-evaluation  AM-PAC PT "6 Clicks" Mobility   Outcome Measure  Help needed turning from your back to your side while in a flat bed without using bedrails?: A Little Help needed moving from lying on your back to sitting on the side of a flat bed without using bedrails?: A Little Help needed moving to and from a bed to a chair (including a wheelchair)?: A Lot Help needed standing up from a chair using your arms (e.g., wheelchair or bedside chair)?: A Lot Help needed to walk in hospital room?: A Little Help needed climbing 3-5 steps with a railing? : A Lot 6 Click Score: 15    End of Session Equipment Utilized During  Treatment: Gait belt Activity Tolerance: Patient tolerated treatment well Patient left: in chair;with call bell/phone within reach;with chair alarm set Nurse Communication: Mobility status PT Visit Diagnosis: Difficulty in walking, not elsewhere classified (R26.2)     Time: [id number] PT Time Calculation (min) (ACUTE ONLY): 40 min  Charges:  $Gait Training: 23-37 mins $Therapeutic Exercise: 8-22 mins                     [place] Pager [phone] Office [phone]    Colette Dicamillo 09/21/2018, 11:00 AM

## 2018-09-22 DIAGNOSIS — M1711 Unilateral primary osteoarthritis, right knee: Secondary | ICD-10-CM | POA: Diagnosis not present

## 2018-09-22 DIAGNOSIS — K219 Gastro-esophageal reflux disease without esophagitis: Secondary | ICD-10-CM | POA: Diagnosis not present

## 2018-09-22 DIAGNOSIS — D62 Acute posthemorrhagic anemia: Secondary | ICD-10-CM | POA: Diagnosis not present

## 2018-09-22 DIAGNOSIS — I10 Essential (primary) hypertension: Secondary | ICD-10-CM | POA: Diagnosis not present

## 2018-09-22 DIAGNOSIS — Z96642 Presence of left artificial hip joint: Secondary | ICD-10-CM | POA: Diagnosis not present

## 2018-09-22 DIAGNOSIS — M545 Low back pain: Secondary | ICD-10-CM | POA: Diagnosis not present

## 2018-09-22 LAB — CBC
HCT: 35.2 % — ABNORMAL LOW (ref 36.0–46.0)
Hemoglobin: 10.9 g/dL — ABNORMAL LOW (ref 12.0–15.0)
MCH: 30.4 pg (ref 26.0–34.0)
MCHC: 31 g/dL (ref 30.0–36.0)
MCV: 98.1 fL (ref 80.0–100.0)
Platelets: 187 10*3/uL (ref 150–400)
RBC: 3.59 MIL/uL — ABNORMAL LOW (ref 3.87–5.11)
RDW: 12.7 % (ref 11.5–15.5)
WBC: 11.3 10*3/uL — ABNORMAL HIGH (ref 4.0–10.5)
nRBC: 0 % (ref 0.0–0.2)

## 2018-09-22 MED ORDER — PANTOPRAZOLE SODIUM 40 MG PO TBEC
40.0000 mg | DELAYED_RELEASE_TABLET | Freq: Every day | ORAL | Status: DC
  Administered 2018-09-22: 40 mg via ORAL
  Filled 2018-09-22: qty 1

## 2018-09-22 NOTE — Progress Notes (Signed)
Physical Therapy Treatment Patient Details Name: Briana Ortiz MRN: [id number] DOB: [birth year] Today's Date: 09/22/2018    History of Present Illness Pt s/p R TKR and with hx of L THR    PT Comments    Good progress with mobility including negotiating stairs.  Pt eager for dc home.   Follow Up Recommendations  Follow surgeon's recommendation for DC plan and follow-up therapies;Home health PT     Equipment Recommendations  Rolling walker with 5" wheels;3in1 (PT)    Recommendations for Other Services       Precautions / Restrictions Precautions Precautions: Knee;Fall Restrictions Weight Bearing Restrictions: No Other Position/Activity Restrictions: WBAT    Mobility  Bed Mobility               General bed mobility comments: Pt up in chair and  requests back to same  Transfers Overall transfer level: Needs assistance Equipment used: Rolling walker (2 wheeled) Transfers: Sit to/from Stand Sit to Stand: Min guard;Supervision         General transfer comment: cues for LE management and use of UEs to self assist  Ambulation/Gait Ambulation/Gait assistance: Min guard;Supervision Gait Distance (Feet): 100 Feet Assistive device: Rolling walker (2 wheeled) Gait Pattern/deviations: Step-to pattern;Decreased step length - right;Decreased step length - left;Shuffle;Trunk flexed Gait velocity: decr   General Gait Details: cues for sequence, posture and position from RW   Stairs Stairs: Yes Stairs assistance: Min assist Stair Management: One rail Left;Step to pattern;Forwards;With crutches;No rails;With walker Number of Stairs: 7 General stair comments: 5 stairs with crutch and rail; single step twice with RW ; cues for sequence and foot/AD placement   Wheelchair Mobility    Modified Rankin (Stroke Patients Only)       Balance Overall balance assessment: Needs assistance Sitting-balance support: Feet supported;No upper extremity supported Sitting  balance-Leahy Scale: Good     Standing balance support: Bilateral upper extremity supported Standing balance-Leahy Scale: Fair                              Cognition Arousal/Alertness: Awake/alert Behavior During Therapy: WFL for tasks assessed/performed Overall Cognitive Status: Within Functional Limits for tasks assessed                                        Exercises Total Joint Exercises Ankle Circles/Pumps: AROM;Both;15 reps;Supine Quad Sets: AROM;Both;Supine;15 reps Heel Slides: AAROM;Right;Supine;20 reps Straight Leg Raises: AAROM;Right;Supine;20 reps    General Comments        Pertinent Vitals/Pain Pain Assessment: 0-10 Pain Score: 5  Pain Location: R knee Pain Descriptors / Indicators: Aching;Sore Pain Intervention(s): Limited activity within patient's tolerance;Monitored during session;Premedicated before session;Ice applied    Home Living                      Prior Function            PT Goals (current goals can now be found in the care plan section) Acute Rehab PT Goals Patient Stated Goal: Regain IND PT Goal Formulation: With patient Time For Goal Achievement: 09/27/18 Potential to Achieve Goals: Good Progress towards PT goals: Progressing toward goals    Frequency    7X/week      PT Plan Current plan remains appropriate    Co-evaluation  AM-PAC PT "6 Clicks" Mobility   Outcome Measure  Help needed turning from your back to your side while in a flat bed without using bedrails?: A Little Help needed moving from lying on your back to sitting on the side of a flat bed without using bedrails?: A Little Help needed moving to and from a bed to a chair (including a wheelchair)?: A Little Help needed standing up from a chair using your arms (e.g., wheelchair or bedside chair)?: A Little Help needed to walk in hospital room?: A Little Help needed climbing 3-5 steps with a railing? : A  Little 6 Click Score: 18    End of Session Equipment Utilized During Treatment: Gait belt Activity Tolerance: Patient tolerated treatment well Patient left: in chair;with call bell/phone within reach;with family/visitor present Nurse Communication: Mobility status PT Visit Diagnosis: Difficulty in walking, not elsewhere classified (R26.2)     Time: 1000-1040 PT Time Calculation (min) (ACUTE ONLY): 40 min  Charges:  $Gait Training: 23-37 mins $Therapeutic Exercise: 8-22 mins                     [place] Pager [phone] Office [phone]    Kerriann Kamphuis 09/22/2018, 3:41 PM

## 2018-09-22 NOTE — Progress Notes (Signed)
PATIENT ID: Briana Ortiz  MRN: [id number]  DOB/AGE:  66-22-1952 / 66 y.o.  2 Days Post-Op Procedure(s) (LRB): RIGHT TOTAL KNEE ARTHROPLASTY (Right)    PROGRESS NOTE Subjective: Patient is alert, oriented, no Nausea, no Vomiting, yes passing gas. Taking PO with pt in room drinking fluids, waiting on breakfast. Denies SOB, Chest or Calf Pain. Using Incentive Spirometer, PAS in place. Ambulate WBAT with pt walking 100 ft with pt, Patient reports pain as 7/10 .    Objective: Vital signs in last 24 hours: Vitals:   09/21/18 1354 09/21/18 2004 09/22/18 0615 09/22/18 0823  BP: 115/79 129/87 (!) 130/92 (!) 151/95  Pulse: 72 69 78 72  Resp: 16 18 18    Temp: 98.3 F (36.8 C) 98.5 F (36.9 C) 98.3 F (36.8 C)   TempSrc:  Oral Oral   SpO2: 96% 98% 97% 96%  Weight:      Height:          Intake/Output from previous day: I/O last 3 completed shifts: In: 1897.8 [P.O.:780; I.V.:1117.8] Out: 2350 [Urine:2350]   Intake/Output this shift: No intake/output data recorded.   LABORATORY DATA: Recent Labs    09/21/18 0220 09/22/18 0401  WBC 8.6 11.3*  HGB 11.5* 10.9*  HCT 36.7 35.2*  PLT 184 187    Examination: Neurologically intact Neurovascular intact Sensation intact distally Intact pulses distally Dorsiflexion/Plantar flexion intact Incision: dressing C/D/I and scant drainage No cellulitis present Compartment soft}  Assessment:   2 Days Post-Op Procedure(s) (LRB): RIGHT TOTAL KNEE ARTHROPLASTY (Right) ADDITIONAL DIAGNOSIS: Expected Acute Blood Loss Anemia, Hypertension  Patient's anticipated LOS is less than 2 midnights, meeting these requirements: - Younger than 50 - Lives within 1 hour of care - Has a competent adult at home to recover with post-op recover - NO history of  - Chronic pain requiring opiods  - Diabetes  - Coronary Artery Disease  - Heart failure  - Heart attack  - Stroke  - DVT/VTE  - Cardiac arrhythmia  - Respiratory Failure/COPD  - Renal  failure  - Anemia  - Advanced Liver disease       Plan: PT/OT WBAT, AROM and PROM  DVT Prophylaxis:  SCDx72hrs, ASA 325 mg x 4 weeks DISCHARGE PLAN: Home DISCHARGE NEEDS: HHPT, CPM, Walker and 3-in-1 comode seat     Joanell Rising 09/22/2018, 8:29 AM

## 2018-09-22 NOTE — Plan of Care (Signed)

## 2018-09-23 ENCOUNTER — Encounter (HOSPITAL_COMMUNITY): Payer: Self-pay | Admitting: Orthopedic Surgery

## 2018-09-23 DIAGNOSIS — M7520 Bicipital tendinitis, unspecified shoulder: Secondary | ICD-10-CM | POA: Diagnosis not present

## 2018-09-23 DIAGNOSIS — I10 Essential (primary) hypertension: Secondary | ICD-10-CM | POA: Diagnosis not present

## 2018-09-23 DIAGNOSIS — Z87440 Personal history of urinary (tract) infections: Secondary | ICD-10-CM | POA: Diagnosis not present

## 2018-09-23 DIAGNOSIS — Z96641 Presence of right artificial hip joint: Secondary | ICD-10-CM | POA: Diagnosis not present

## 2018-09-23 DIAGNOSIS — Z9181 History of falling: Secondary | ICD-10-CM | POA: Diagnosis not present

## 2018-09-23 DIAGNOSIS — Z96651 Presence of right artificial knee joint: Secondary | ICD-10-CM | POA: Diagnosis not present

## 2018-09-23 DIAGNOSIS — Z7982 Long term (current) use of aspirin: Secondary | ICD-10-CM | POA: Diagnosis not present

## 2018-09-23 DIAGNOSIS — K219 Gastro-esophageal reflux disease without esophagitis: Secondary | ICD-10-CM | POA: Diagnosis not present

## 2018-09-23 DIAGNOSIS — Z471 Aftercare following joint replacement surgery: Secondary | ICD-10-CM | POA: Diagnosis not present

## 2018-09-23 DIAGNOSIS — M1711 Unilateral primary osteoarthritis, right knee: Secondary | ICD-10-CM | POA: Diagnosis not present

## 2018-09-24 DIAGNOSIS — K219 Gastro-esophageal reflux disease without esophagitis: Secondary | ICD-10-CM | POA: Diagnosis not present

## 2018-09-24 DIAGNOSIS — Z96641 Presence of right artificial hip joint: Secondary | ICD-10-CM | POA: Diagnosis not present

## 2018-09-24 DIAGNOSIS — Z87440 Personal history of urinary (tract) infections: Secondary | ICD-10-CM | POA: Diagnosis not present

## 2018-09-24 DIAGNOSIS — Z9181 History of falling: Secondary | ICD-10-CM | POA: Diagnosis not present

## 2018-09-24 DIAGNOSIS — Z471 Aftercare following joint replacement surgery: Secondary | ICD-10-CM | POA: Diagnosis not present

## 2018-09-24 DIAGNOSIS — Z7982 Long term (current) use of aspirin: Secondary | ICD-10-CM | POA: Diagnosis not present

## 2018-09-24 DIAGNOSIS — Z96651 Presence of right artificial knee joint: Secondary | ICD-10-CM | POA: Diagnosis not present

## 2018-09-24 DIAGNOSIS — M7520 Bicipital tendinitis, unspecified shoulder: Secondary | ICD-10-CM | POA: Diagnosis not present

## 2018-09-24 DIAGNOSIS — I10 Essential (primary) hypertension: Secondary | ICD-10-CM | POA: Diagnosis not present

## 2018-09-25 DIAGNOSIS — K219 Gastro-esophageal reflux disease without esophagitis: Secondary | ICD-10-CM | POA: Diagnosis not present

## 2018-09-25 DIAGNOSIS — Z7982 Long term (current) use of aspirin: Secondary | ICD-10-CM | POA: Diagnosis not present

## 2018-09-25 DIAGNOSIS — M7520 Bicipital tendinitis, unspecified shoulder: Secondary | ICD-10-CM | POA: Diagnosis not present

## 2018-09-25 DIAGNOSIS — Z96651 Presence of right artificial knee joint: Secondary | ICD-10-CM | POA: Diagnosis not present

## 2018-09-25 DIAGNOSIS — Z96641 Presence of right artificial hip joint: Secondary | ICD-10-CM | POA: Diagnosis not present

## 2018-09-25 DIAGNOSIS — Z87440 Personal history of urinary (tract) infections: Secondary | ICD-10-CM | POA: Diagnosis not present

## 2018-09-25 DIAGNOSIS — Z471 Aftercare following joint replacement surgery: Secondary | ICD-10-CM | POA: Diagnosis not present

## 2018-09-25 DIAGNOSIS — Z9181 History of falling: Secondary | ICD-10-CM | POA: Diagnosis not present

## 2018-09-25 DIAGNOSIS — I10 Essential (primary) hypertension: Secondary | ICD-10-CM | POA: Diagnosis not present

## 2018-09-27 DIAGNOSIS — I10 Essential (primary) hypertension: Secondary | ICD-10-CM | POA: Diagnosis not present

## 2018-09-27 DIAGNOSIS — M7520 Bicipital tendinitis, unspecified shoulder: Secondary | ICD-10-CM | POA: Diagnosis not present

## 2018-09-27 DIAGNOSIS — Z96651 Presence of right artificial knee joint: Secondary | ICD-10-CM | POA: Diagnosis not present

## 2018-09-27 DIAGNOSIS — K219 Gastro-esophageal reflux disease without esophagitis: Secondary | ICD-10-CM | POA: Diagnosis not present

## 2018-09-27 DIAGNOSIS — Z9181 History of falling: Secondary | ICD-10-CM | POA: Diagnosis not present

## 2018-09-27 DIAGNOSIS — Z87440 Personal history of urinary (tract) infections: Secondary | ICD-10-CM | POA: Diagnosis not present

## 2018-09-27 DIAGNOSIS — Z7982 Long term (current) use of aspirin: Secondary | ICD-10-CM | POA: Diagnosis not present

## 2018-09-27 DIAGNOSIS — Z96641 Presence of right artificial hip joint: Secondary | ICD-10-CM | POA: Diagnosis not present

## 2018-09-27 DIAGNOSIS — Z471 Aftercare following joint replacement surgery: Secondary | ICD-10-CM | POA: Diagnosis not present

## 2018-09-29 ENCOUNTER — Emergency Department (HOSPITAL_BASED_OUTPATIENT_CLINIC_OR_DEPARTMENT_OTHER): Payer: Medicare Other

## 2018-09-29 ENCOUNTER — Other Ambulatory Visit: Payer: Self-pay

## 2018-09-29 ENCOUNTER — Encounter (HOSPITAL_BASED_OUTPATIENT_CLINIC_OR_DEPARTMENT_OTHER): Payer: Self-pay | Admitting: Emergency Medicine

## 2018-09-29 ENCOUNTER — Emergency Department (HOSPITAL_BASED_OUTPATIENT_CLINIC_OR_DEPARTMENT_OTHER)
Admission: EM | Admit: 2018-09-29 | Discharge: 2018-09-29 | Disposition: A | Discharge status: Home / Self Care | Payer: Medicare Other | Attending: Emergency Medicine | Admitting: Emergency Medicine

## 2018-09-29 DIAGNOSIS — Z79899 Other long term (current) drug therapy: Secondary | ICD-10-CM | POA: Diagnosis not present

## 2018-09-29 DIAGNOSIS — R112 Nausea with vomiting, unspecified: Secondary | ICD-10-CM | POA: Insufficient documentation

## 2018-09-29 DIAGNOSIS — Z7982 Long term (current) use of aspirin: Secondary | ICD-10-CM | POA: Insufficient documentation

## 2018-09-29 DIAGNOSIS — Z9889 Other specified postprocedural states: Secondary | ICD-10-CM | POA: Diagnosis not present

## 2018-09-29 DIAGNOSIS — K259 Gastric ulcer, unspecified as acute or chronic, without hemorrhage or perforation: Secondary | ICD-10-CM | POA: Insufficient documentation

## 2018-09-29 DIAGNOSIS — R1110 Vomiting, unspecified: Secondary | ICD-10-CM | POA: Diagnosis not present

## 2018-09-29 DIAGNOSIS — J9811 Atelectasis: Secondary | ICD-10-CM | POA: Diagnosis not present

## 2018-09-29 DIAGNOSIS — K598 Other specified functional intestinal disorders: Secondary | ICD-10-CM | POA: Diagnosis not present

## 2018-09-29 DIAGNOSIS — R109 Unspecified abdominal pain: Secondary | ICD-10-CM | POA: Diagnosis present

## 2018-09-29 DIAGNOSIS — I10 Essential (primary) hypertension: Secondary | ICD-10-CM | POA: Insufficient documentation

## 2018-09-29 LAB — COMPREHENSIVE METABOLIC PANEL
ALT: 24 U/L (ref 0–44)
AST: 26 U/L (ref 15–41)
Albumin: 4.2 g/dL (ref 3.5–5.0)
Alkaline Phosphatase: 52 U/L (ref 38–126)
Anion gap: 13 (ref 5–15)
BUN: 15 mg/dL (ref 8–23)
CO2: 30 mmol/L (ref 22–32)
Calcium: 10.5 mg/dL — ABNORMAL HIGH (ref 8.9–10.3)
Chloride: 96 mmol/L — ABNORMAL LOW (ref 98–111)
Creatinine, Ser: 0.89 mg/dL (ref 0.44–1.00)
GFR calc Af Amer: >60 mL/min (ref >60)
GFR calc non Af Amer: >60 mL/min (ref >60)
Glucose, Bld: 135 mg/dL — ABNORMAL HIGH (ref 70–99)
Potassium: 3.6 mmol/L (ref 3.5–5.1)
Sodium: 139 mmol/L (ref 135–145)
Total Bilirubin: 1 mg/dL (ref 0.3–1.2)
Total Protein: 7.6 g/dL (ref 6.5–8.1)

## 2018-09-29 LAB — URINALYSIS, ROUTINE W REFLEX MICROSCOPIC
Bilirubin Urine: NEGATIVE
Glucose, UA: NEGATIVE mg/dL
Hgb urine dipstick: NEGATIVE
Ketones, ur: NEGATIVE mg/dL
Nitrite: NEGATIVE
Protein, ur: NEGATIVE mg/dL
Specific Gravity, Urine: 1.015 (ref 1.005–1.030)
pH: 8.5 — ABNORMAL HIGH (ref 5.0–8.0)

## 2018-09-29 LAB — CBC WITH DIFFERENTIAL/PLATELET
Abs Immature Granulocytes: 0.04 10*3/uL (ref 0.00–0.07)
Basophils Absolute: 0 10*3/uL (ref 0.0–0.1)
Basophils Relative: 0 %
Eosinophils Absolute: 0 10*3/uL (ref 0.0–0.5)
Eosinophils Relative: 1 %
HCT: 36.7 % (ref 36.0–46.0)
Hemoglobin: 11.4 g/dL — ABNORMAL LOW (ref 12.0–15.0)
Immature Granulocytes: 1 %
Lymphocytes Relative: 12 %
Lymphs Abs: 1.1 10*3/uL (ref 0.7–4.0)
MCH: 30.2 pg (ref 26.0–34.0)
MCHC: 31.1 g/dL (ref 30.0–36.0)
MCV: 97.1 fL (ref 80.0–100.0)
Monocytes Absolute: 0.3 10*3/uL (ref 0.1–1.0)
Monocytes Relative: 4 %
Neutro Abs: 7.4 10*3/uL (ref 1.7–7.7)
Neutrophils Relative %: 82 %
Platelets: 264 10*3/uL (ref 150–400)
RBC: 3.78 MIL/uL — ABNORMAL LOW (ref 3.87–5.11)
RDW: 13.2 % (ref 11.5–15.5)
WBC: 8.9 10*3/uL (ref 4.0–10.5)
nRBC: 0 % (ref 0.0–0.2)

## 2018-09-29 LAB — URINALYSIS, MICROSCOPIC (REFLEX): RBC / HPF: NONE SEEN RBC/hpf (ref 0–5)

## 2018-09-29 LAB — LIPASE, BLOOD: Lipase: 25 U/L (ref 11–51)

## 2018-09-29 MED ORDER — OMEPRAZOLE 20 MG PO CPDR: 20.0000 mg | DELAYED_RELEASE_CAPSULE | Freq: Two times a day (BID) | ORAL | 0 refills | Status: DC

## 2018-09-29 MED ORDER — ONDANSETRON 4 MG PO TBDP: 4.0000 mg | ORAL_TABLET | Freq: Three times a day (TID) | ORAL | 0 refills | Status: AC | PRN

## 2018-09-29 MED ORDER — ONDANSETRON HCL 4 MG/2ML IJ SOLN
4.0000 mg | Freq: Once | INTRAMUSCULAR | Status: AC
  Administered 2018-09-29: 13:00:00 4 mg via INTRAVENOUS
  Filled 2018-09-29: qty 2

## 2018-09-29 MED ORDER — IOHEXOL 300 MG/ML  SOLN
100.0000 mL | Freq: Once | INTRAMUSCULAR | Status: AC | PRN
  Administered 2018-09-29: 12:00:00 100 mL via INTRAVENOUS

## 2018-09-29 MED ORDER — ONDANSETRON HCL 4 MG/2ML IJ SOLN
4.0000 mg | Freq: Once | INTRAMUSCULAR | Status: AC
  Administered 2018-09-29: 4 mg via INTRAVENOUS
  Filled 2018-09-29: qty 2

## 2018-09-29 NOTE — ED Provider Notes (Signed)
[facility] EMERGENCY DEPARTMENT Provider Note   CSN: [id number] Arrival date & time: 09/29/18  0907     History   Chief Complaint Chief Complaint  Patient presents with  . Emesis    HPI Briana Ortiz is a 68 y.o. female.     HPI Patient presents with abdominal pain and vomiting.  Has had for around 2 days.  States she had a knee replacement around a week ago.  She is been on pain medicines for that.  Previously had oxycodone.  States when she eats it will come back up.  No diarrhea.  States she has a history of fibroids but will often give her pain.  Feels somewhat better after she vomits. Past Medical History:  Diagnosis Date  . Allergies   . Arthritis   . Dysrhythmia   . GERD (gastroesophageal reflux disease)   . Hypertension   . Uterine fibroid     Patient Active Problem List   Diagnosis Date Noted  . Primary osteoarthritis of right knee 09/20/2018  . Bradycardia 03/02/2017  . Nail abnormality 07/24/2016  . Acute viral sinusitis 07/24/2016  . Hemarthrosis of right knee 05/26/2016  . Biceps tendinitis 01/13/2016  . Pruritic rash 08/26/2015  . Chronic back pain 05/27/2014  . Uterine fibroid 01/21/2014  . Health care maintenance 09/12/2013  . Left hand pain 08/15/2012  . Osteoarthritis of left hip 03/14/2011  . Essential hypertension 12/09/2005  . GERD 12/09/2005    Past Surgical History:  Procedure Laterality Date  . HEMORRHOID SURGERY    . TONSILLECTOMY    . TOTAL HIP ARTHROPLASTY Left 03/15/2012   Procedure: TOTAL HIP ARTHROPLASTY ANTERIOR APPROACH;  Surgeon: Alta Corning, MD;  Location: [facility];  Service: Orthopedics;  Laterality: Left;  . TOTAL KNEE ARTHROPLASTY Right 09/20/2018   Procedure: RIGHT TOTAL KNEE ARTHROPLASTY;  Surgeon: Dorna Leitz, MD;  Location: [facility];  Service: Orthopedics;  Laterality: Right;  . TUBAL LIGATION       OB History    Gravida  1   Para  1   Term  1   Preterm      AB      Living  1     SAB      TAB      Ectopic      Multiple      Live Births               Home Medications    Prior to Admission medications   Medication Sig Start Date End Date Taking? Authorizing Provider  aspirin EC 325 MG tablet Take 1 tablet (325 mg total) by mouth 2 (two) times daily after a meal. Take x 1 month post op to decrease risk of blood clots. 09/20/18   Gary Fleet, PA-C  cetirizine (ZYRTEC ALLERGY) 10 MG tablet Take 1 tablet (10 mg total) by mouth daily. 09/20/15   Delsa Grana, PA-C  docusate sodium (COLACE) 100 MG capsule Take 1 capsule (100 mg total) by mouth 2 (two) times daily. 09/20/18   Gary Fleet, PA-C  fluticasone (FLONASE) 50 MCG/ACT nasal spray Place 2 sprays into both nostrils daily. Patient taking differently: Place 2 sprays into both nostrils daily as needed for allergies.  07/24/16   Shela Leff, MD  lisinopril-hydrochlorothiazide (PRINZIDE,ZESTORETIC) 20-25 MG tablet TAKE 1 TABLET BY MOUTH ONCE DAILY IN THE MORNING Patient taking differently: Take 1 tablet by mouth daily.  08/07/17   Oda Kilts, MD  loratadine (CLARITIN REDITABS) 10 MG  dissolvable tablet Take 1 tablet (10 mg total) by mouth every morning. Patient not taking: Reported on 11/04/2017 08/26/15 06/22/18  Rivet, Sindy Guadeloupe, MD  Multiple Vitamin (MULTIVITAMIN WITH MINERALS) TABS Take 1 tablet by mouth daily.    [provider]  omeprazole (PRILOSEC) 20 MG capsule Take 1 capsule (20 mg total) by mouth 2 (two) times daily before a meal. 09/29/18   Davonna Belling, MD  ondansetron (ZOFRAN-ODT) 4 MG disintegrating tablet Take 1 tablet (4 mg total) by mouth every 8 (eight) hours as needed for nausea or vomiting. 09/29/18   Davonna Belling, MD  oxyCODONE-acetaminophen (PERCOCET/ROXICET) 5-325 MG tablet Take 1-2 tablets by mouth every 6 (six) hours as needed for severe pain. 09/20/18   Gary Fleet, PA-C  tiZANidine (ZANAFLEX) 2 MG tablet Take 1 tablet (2 mg total) by mouth every 8 (eight) hours as needed  for muscle spasms. 09/20/18   Gary Fleet, PA-C    Family History Family History  Problem Relation Age of Onset  . Alcohol abuse Mother   . Breast cancer Neg Hx   . Colon cancer Neg Hx   . Ovarian cancer Neg Hx   . Stroke Neg Hx     Social History Social History   Tobacco Use  . Smoking status: Never Smoker  . Smokeless tobacco: Never Used  Substance Use Topics  . Alcohol use: Yes    Alcohol/week: 5.0 - 6.0 standard drinks    Types: 5 - 6 Cans of beer per week    Comment: Occasionally.Marland Kitchen 09-18-2018 "once a week"   . Drug use: No     Allergies   Patient has no known allergies.   Review of Systems Review of Systems  Constitutional: Positive for appetite change.  HENT: Negative for congestion.   Respiratory: Negative for shortness of breath.   Cardiovascular: Positive for leg swelling.  Gastrointestinal: Positive for abdominal pain, nausea and vomiting. Negative for constipation.  Genitourinary: Negative for flank pain.  Musculoskeletal: Negative for back pain.  Skin: Negative for rash.  Neurological: Negative for weakness.  Psychiatric/Behavioral: Negative for confusion.     Physical Exam Updated Vital Signs BP 137/85 (BP Location: Right Arm)   Pulse 75   Temp 98.4 F (36.9 C) (Oral)   Resp 18   Ht 5\' 1"  (1.549 m)   Wt 96.6 kg   SpO2 95%   BMI 40.25 kg/m   Physical Exam Vitals signs and nursing note reviewed.  HENT:     Head: Normocephalic.  Eyes:     Pupils: Pupils are equal, round, and reactive to light.  Neck:     Musculoskeletal: Neck supple.  Cardiovascular:     Rate and Rhythm: Regular rhythm.  Abdominal:     Palpations: There is mass.     Comments: Some lower abdominal tenderness with mass.  History of fibroids.  Musculoskeletal:     Comments: Bruising and swelling of right lower extremity.  Post knee replacement.  Dressing intact with some dried blood.  Skin:    General: Skin is warm.     Capillary Refill: Capillary refill takes less  than 2 seconds.  Neurological:     Mental Status: She is alert. Mental status is at baseline.      ED Treatments / Results  Labs (all labs ordered are listed, but only abnormal results are displayed) Labs Reviewed  COMPREHENSIVE METABOLIC PANEL - Abnormal; Notable for the following components:      Result Value   Chloride 96 (*)  Glucose, Bld 135 (*)    Calcium 10.5 (*)    All other components within normal limits  CBC WITH DIFFERENTIAL/PLATELET - Abnormal; Notable for the following components:   RBC 3.78 (*)    Hemoglobin 11.4 (*)    All other components within normal limits  URINALYSIS, ROUTINE W REFLEX MICROSCOPIC - Abnormal; Notable for the following components:   APPearance CLOUDY (*)    pH 8.5 (*)    Leukocytes,Ua TRACE (*)    All other components within normal limits  URINALYSIS, MICROSCOPIC (REFLEX) - Abnormal; Notable for the following components:   Bacteria, UA MANY (*)    All other components within normal limits  LIPASE, BLOOD    EKG None  Radiology Ct Abdomen Pelvis W Contrast  Result Date: 09/29/2018 CLINICAL DATA:  Nausea and vomiting. Abdominal pain and distention. Recent knee surgery. EXAM: CT ABDOMEN AND PELVIS WITH CONTRAST TECHNIQUE: Multidetector CT imaging of the abdomen and pelvis was performed using the standard protocol following bolus administration of intravenous contrast. CONTRAST:  151mL OMNIPAQUE IOHEXOL 300 MG/ML  SOLN COMPARISON:  11/04/2017 CT abdomen/pelvis. FINDINGS: Lower chest: Mild platelike scarring versus atelectasis at both lung bases. Hepatobiliary: Normal liver size. Two scattered subcentimeter hypodense peripheral right liver lesions are too small to characterize and are unchanged, presumably benign. No new liver lesions. Normal gallbladder with no radiopaque cholelithiasis. No biliary ductal dilatation. Pancreas: Normal, with no mass or duct dilation. Spleen: Normal size. No mass. Adrenals/Urinary Tract: Normal adrenals. Normal  kidneys with no hydronephrosis and no renal mass. Visualization of the bladder and pelvic ureters is obscured by streak artifact from the left hip hardware. Bladder grossly normal. Stomach/Bowel: There are 2 adjacent ulcers in the lesser curvature of the stomach measuring 1.3 cm and 1.4 cm diameter (series 5/images 32 and 37, respectively), with associated moderate eccentric wall thickening along the lesser curvature of the stomach. No adjacent free air or abscess. Normal caliber small bowel with no small bowel wall thickening. Normal appendix. Normal large bowel with no diverticulosis, large bowel wall thickening or pericolonic fat stranding. Vascular/Lymphatic: Mildly atherosclerotic nonaneurysmal abdominal aorta. Patent portal, splenic, hepatic and renal veins. No pathologically enlarged lymph nodes in the abdomen or pelvis. Reproductive: Enlarged myomatous uterus with coarsely calcified dominant 7.1 cm anterior uterine fibroid. No adnexal masses. Other: No pneumoperitoneum, ascites or focal fluid collection. Musculoskeletal: No aggressive appearing focal osseous lesions. Left total hip arthroplasty. Moderate to marked lumbar spondylosis. IMPRESSION: 1. Two adjacent lesser curvature gastric ulcers with associated moderate eccentric wall thickening along the lesser curvature of the stomach. No free air or abscess. GI consultation advised for upper endoscopic correlation, to exclude malignancy. 2. Mild bibasilar scarring versus atelectasis. 3. Enlarged myomatous uterus. 4.  Aortic Atherosclerosis (ICD10-I70.0). Electronically Signed   By: Ilona Sorrel M.D.   On: 09/29/2018 12:16   Dg Abdomen Acute W/chest  Result Date: 09/29/2018 CLINICAL DATA:  68 year old female with right-sided abdominal pain, nausea and vomiting for the past 2 days. EXAM: DG ABDOMEN ACUTE W/ 1V CHEST COMPARISON:  Prior chest x-ray 05/19/2018 FINDINGS: New onset right upper lobe atelectasis with elevation of the minor fissure. Interval  exacerbation of linear atelectasis/scarring in the left mid lung. Overall inspiratory volumes are lower. No focal airspace consolidation, pleural effusion or pneumothorax. Stable bronchitic changes. Single loop of air-filled small bowel in the left mid abdomen. The bowel measures up to 4.3 cm in diameter which is mildly dilated. No evidence of free air. Large dystrophic uterine fibroid overlies the anatomic pelvis.  Incompletely imaged surgical changes of prior left hip arthroplasty. IMPRESSION: 1. A single loop of dilated small bowel in the left mid abdomen. This may represent focal ileus related to an underlying infectious/inflammatory process such as diverticulitis. Alternately, this may reflect early or partial small bowel obstruction. 2. Lower inspiratory volumes with increased linear atelectasis. This may be due to shallower respirations in the setting of pain. 3. Large dystrophic calcified uterine fibroid. Electronically Signed   By: Jacqulynn Cadet M.D.   On: 09/29/2018 10:38    Procedures Procedures (including critical care time)  Medications Ordered in ED Medications  ondansetron (ZOFRAN) injection 4 mg (4 mg Intravenous Given 09/29/18 0956)  iohexol (OMNIPAQUE) 300 MG/ML solution 100 mL (100 mLs Intravenous Contrast Given 09/29/18 1135)     Initial Impression / Assessment and Plan / ED Course  I have reviewed the triage vital signs and the nursing notes.  Pertinent labs & imaging results that were available during my care of the patient were reviewed by me and considered in my medical decision making (see chart for details).        Patient presents with abdominal pain nausea and vomiting.  Recent surgery.  X-ray showed possible ileus versus obstruction, however CT scan showed 2 gastric ulcers.  No bleeding or air next to him.  Doubt these are perforated but could be the cause of the vomiting.  Also potentially could be due to the pain medicine that she is not usually taking.  Will  discuss with her orthopedic surgeon tomorrow about stopping the aspirin in the setting of the ulcer.  Final Clinical Impressions(s) / ED Diagnoses   Final diagnoses:  Multiple gastric ulcers  Non-intractable vomiting with nausea, unspecified vomiting type    ED Discharge Orders         Ordered    omeprazole (PRILOSEC) 20 MG capsule  2 times daily before meals     09/29/18 1251    ondansetron (ZOFRAN-ODT) 4 MG disintegrating tablet  Every 8 hours PRN     09/29/18 1251           Davonna Belling, MD 09/29/18 1254

## 2018-09-29 NOTE — ED Notes (Signed)
IV attempt unsuccessful x1

## 2018-09-29 NOTE — ED Notes (Signed)
Pt aware of need for urine specimen. 

## 2018-09-29 NOTE — ED Notes (Signed)
Assisted pt with bedpan

## 2018-09-29 NOTE — Discharge Instructions (Addendum)
Follow-up with gastroenterology.  Try and keep yourself hydrated.  Watch for bleeding or increased pain or fevers.  Call the orthopedic surgeon tomorrow about stopping the aspirin.

## 2018-09-29 NOTE — ED Triage Notes (Signed)
Generalized abd pain and vomiting x 2 days. She had R knee replacement 1 week ago.

## 2018-09-29 NOTE — ED Notes (Signed)
Pt vomited x1.  

## 2018-09-30 DIAGNOSIS — Z7982 Long term (current) use of aspirin: Secondary | ICD-10-CM | POA: Diagnosis not present

## 2018-09-30 DIAGNOSIS — I10 Essential (primary) hypertension: Secondary | ICD-10-CM | POA: Diagnosis not present

## 2018-09-30 DIAGNOSIS — K219 Gastro-esophageal reflux disease without esophagitis: Secondary | ICD-10-CM | POA: Diagnosis not present

## 2018-09-30 DIAGNOSIS — M7520 Bicipital tendinitis, unspecified shoulder: Secondary | ICD-10-CM | POA: Diagnosis not present

## 2018-09-30 DIAGNOSIS — Z9181 History of falling: Secondary | ICD-10-CM | POA: Diagnosis not present

## 2018-09-30 DIAGNOSIS — Z96641 Presence of right artificial hip joint: Secondary | ICD-10-CM | POA: Diagnosis not present

## 2018-09-30 DIAGNOSIS — Z96651 Presence of right artificial knee joint: Secondary | ICD-10-CM | POA: Diagnosis not present

## 2018-09-30 DIAGNOSIS — Z87440 Personal history of urinary (tract) infections: Secondary | ICD-10-CM | POA: Diagnosis not present

## 2018-09-30 DIAGNOSIS — Z471 Aftercare following joint replacement surgery: Secondary | ICD-10-CM | POA: Diagnosis not present

## 2018-10-01 DIAGNOSIS — M1711 Unilateral primary osteoarthritis, right knee: Secondary | ICD-10-CM | POA: Diagnosis not present

## 2018-10-02 DIAGNOSIS — Z87440 Personal history of urinary (tract) infections: Secondary | ICD-10-CM | POA: Diagnosis not present

## 2018-10-02 DIAGNOSIS — Z96651 Presence of right artificial knee joint: Secondary | ICD-10-CM | POA: Diagnosis not present

## 2018-10-02 DIAGNOSIS — K219 Gastro-esophageal reflux disease without esophagitis: Secondary | ICD-10-CM | POA: Diagnosis not present

## 2018-10-02 DIAGNOSIS — Z9181 History of falling: Secondary | ICD-10-CM | POA: Diagnosis not present

## 2018-10-02 DIAGNOSIS — Z96641 Presence of right artificial hip joint: Secondary | ICD-10-CM | POA: Diagnosis not present

## 2018-10-02 DIAGNOSIS — I10 Essential (primary) hypertension: Secondary | ICD-10-CM | POA: Diagnosis not present

## 2018-10-02 DIAGNOSIS — M7520 Bicipital tendinitis, unspecified shoulder: Secondary | ICD-10-CM | POA: Diagnosis not present

## 2018-10-02 DIAGNOSIS — Z471 Aftercare following joint replacement surgery: Secondary | ICD-10-CM | POA: Diagnosis not present

## 2018-10-02 DIAGNOSIS — Z7982 Long term (current) use of aspirin: Secondary | ICD-10-CM | POA: Diagnosis not present

## 2018-10-03 DIAGNOSIS — Z96651 Presence of right artificial knee joint: Secondary | ICD-10-CM | POA: Diagnosis not present

## 2018-10-03 DIAGNOSIS — M25661 Stiffness of right knee, not elsewhere classified: Secondary | ICD-10-CM | POA: Diagnosis not present

## 2018-10-08 DIAGNOSIS — M25661 Stiffness of right knee, not elsewhere classified: Secondary | ICD-10-CM | POA: Diagnosis not present

## 2018-10-08 DIAGNOSIS — Z96651 Presence of right artificial knee joint: Secondary | ICD-10-CM | POA: Diagnosis not present

## 2018-10-14 DIAGNOSIS — Z96651 Presence of right artificial knee joint: Secondary | ICD-10-CM | POA: Diagnosis not present

## 2018-10-14 DIAGNOSIS — M25662 Stiffness of left knee, not elsewhere classified: Secondary | ICD-10-CM | POA: Diagnosis not present

## 2018-10-21 DIAGNOSIS — Z96651 Presence of right artificial knee joint: Secondary | ICD-10-CM | POA: Diagnosis not present

## 2018-10-21 DIAGNOSIS — M25661 Stiffness of right knee, not elsewhere classified: Secondary | ICD-10-CM | POA: Diagnosis not present

## 2018-10-22 ENCOUNTER — Ambulatory Visit: Payer: Medicare Other | Admitting: Physician Assistant

## 2018-10-23 DIAGNOSIS — M25661 Stiffness of right knee, not elsewhere classified: Secondary | ICD-10-CM | POA: Diagnosis not present

## 2018-10-23 DIAGNOSIS — Z96651 Presence of right artificial knee joint: Secondary | ICD-10-CM | POA: Diagnosis not present

## 2018-10-24 ENCOUNTER — Other Ambulatory Visit: Payer: Self-pay | Admitting: Internal Medicine

## 2018-10-28 DIAGNOSIS — Z96651 Presence of right artificial knee joint: Secondary | ICD-10-CM | POA: Diagnosis not present

## 2018-10-28 DIAGNOSIS — M25661 Stiffness of right knee, not elsewhere classified: Secondary | ICD-10-CM | POA: Diagnosis not present

## 2018-10-29 ENCOUNTER — Other Ambulatory Visit: Payer: Self-pay | Admitting: Internal Medicine

## 2018-10-29 DIAGNOSIS — M25661 Stiffness of right knee, not elsewhere classified: Secondary | ICD-10-CM | POA: Diagnosis not present

## 2018-10-31 ENCOUNTER — Other Ambulatory Visit: Payer: Self-pay | Admitting: Registered Nurse

## 2018-10-31 DIAGNOSIS — Z1231 Encounter for screening mammogram for malignant neoplasm of breast: Secondary | ICD-10-CM

## 2018-10-31 DIAGNOSIS — Z96651 Presence of right artificial knee joint: Secondary | ICD-10-CM | POA: Diagnosis not present

## 2018-10-31 DIAGNOSIS — M25661 Stiffness of right knee, not elsewhere classified: Secondary | ICD-10-CM | POA: Diagnosis not present

## 2018-11-01 ENCOUNTER — Ambulatory Visit: Payer: Medicare Other | Admitting: Gastroenterology

## 2018-11-04 ENCOUNTER — Other Ambulatory Visit: Payer: Self-pay | Admitting: Internal Medicine

## 2018-11-04 DIAGNOSIS — Z96651 Presence of right artificial knee joint: Secondary | ICD-10-CM | POA: Diagnosis not present

## 2018-11-04 DIAGNOSIS — M25661 Stiffness of right knee, not elsewhere classified: Secondary | ICD-10-CM | POA: Diagnosis not present

## 2018-11-06 DIAGNOSIS — Z96651 Presence of right artificial knee joint: Secondary | ICD-10-CM | POA: Diagnosis not present

## 2018-11-06 DIAGNOSIS — M25661 Stiffness of right knee, not elsewhere classified: Secondary | ICD-10-CM | POA: Diagnosis not present

## 2018-11-11 DIAGNOSIS — Z96651 Presence of right artificial knee joint: Secondary | ICD-10-CM | POA: Diagnosis not present

## 2018-11-11 DIAGNOSIS — M25661 Stiffness of right knee, not elsewhere classified: Secondary | ICD-10-CM | POA: Diagnosis not present

## 2018-11-14 DIAGNOSIS — Z96651 Presence of right artificial knee joint: Secondary | ICD-10-CM | POA: Diagnosis not present

## 2018-11-14 DIAGNOSIS — M25661 Stiffness of right knee, not elsewhere classified: Secondary | ICD-10-CM | POA: Diagnosis not present

## 2018-11-19 ENCOUNTER — Encounter: Payer: Self-pay | Admitting: Gastroenterology

## 2018-11-19 ENCOUNTER — Ambulatory Visit (INDEPENDENT_AMBULATORY_CARE_PROVIDER_SITE_OTHER): Payer: Medicare Other | Admitting: Gastroenterology

## 2018-11-19 VITALS — BP 98/74 | HR 73 | Temp 98.9°F | Ht 61.0 in | Wt 200.0 lb

## 2018-11-19 DIAGNOSIS — Z791 Long term (current) use of non-steroidal anti-inflammatories (NSAID): Secondary | ICD-10-CM | POA: Diagnosis not present

## 2018-11-19 DIAGNOSIS — R112 Nausea with vomiting, unspecified: Secondary | ICD-10-CM

## 2018-11-19 DIAGNOSIS — R1013 Epigastric pain: Secondary | ICD-10-CM

## 2018-11-19 DIAGNOSIS — K259 Gastric ulcer, unspecified as acute or chronic, without hemorrhage or perforation: Secondary | ICD-10-CM | POA: Diagnosis not present

## 2018-11-19 MED ORDER — OMEPRAZOLE 40 MG PO CPDR: 40.0000 mg | DELAYED_RELEASE_CAPSULE | Freq: Two times a day (BID) | ORAL | 2 refills | Status: DC

## 2018-11-19 NOTE — Patient Instructions (Addendum)
Call us when you are ready to schedule an endoscopy with Dr. Hilarie Fredrickson  We have sent the following medications to your pharmacy for you to pick up at your convenience:  Omeprazole

## 2018-11-19 NOTE — Progress Notes (Signed)
11/19/2018 Briana Ortiz [id number] Jun 25, 1950   HISTORY OF PRESENT ILLNESS: This is a 68 year old female who is new to our office.  She is here today as an ER follow-up for evaluation regarding epigastric abdominal pain, nausea, vomiting, and finding of gastric ulcers on CT scan.  She had a right total knee replacement on August 21.  On August 30 she was in the emergency department with the above complaints.  CT scan of the abdomen pelvis with contrast showed 2 adjacent lesser curvature gastric ulcers with associated moderate eccentric wall thickening along the lesser curvature of the stomach.  She reports that she had been taking BC powders quite regularly.  She tells me that she has a history of ulcers years ago as well.  In the ER they prescribed omeprazole 20 mg twice daily as well as Zofran for nausea and vomiting.  She ran out of the medication that they provided so she is now only taking over-the-counter omeprazole, 20 mg once daily.  She has definitely had improvement in her symptoms.  She is no longer needing the Zofran for nausea vomiting, but still gets epigastric abdominal pain that wakes her at night at times.  Her last colonoscopy was in January 2012 by Dr. Penelope Coop.  At that time the study was incomplete due to tortuous colon, but he did reach the ascending colon.  Follow-up barium enema was unremarkable.   Past Medical History:  Diagnosis Date  . Allergies   . Arthritis   . Dysrhythmia   . GERD (gastroesophageal reflux disease)   . Hypertension   . Uterine fibroid    Past Surgical History:  Procedure Laterality Date  . COLONOSCOPY     incomplete 2012   . HEMORRHOID SURGERY    . TONSILLECTOMY    . TOTAL HIP ARTHROPLASTY Left 03/15/2012   Procedure: TOTAL HIP ARTHROPLASTY ANTERIOR APPROACH;  Surgeon: Alta Corning, MD;  Location: [place];  Service: Orthopedics;  Laterality: Left;  . TOTAL KNEE ARTHROPLASTY Right 09/20/2018   Procedure: RIGHT TOTAL KNEE ARTHROPLASTY;   Surgeon: Dorna Leitz, MD;  Location: [facility];  Service: Orthopedics;  Laterality: Right;  . TUBAL LIGATION      reports that she has never smoked. She has never used smokeless tobacco. She reports current alcohol use of about 5.0 - 6.0 standard drinks of alcohol per week. She reports that she does not use drugs. family history includes Alcohol abuse in her mother; Diabetes (age of onset: 69) in her daughter. No Known Allergies    Outpatient Encounter Medications as of 11/19/2018  Medication Sig  . cetirizine (ZYRTEC ALLERGY) 10 MG tablet Take 1 tablet (10 mg total) by mouth daily.  . fluticasone (FLONASE) 50 MCG/ACT nasal spray Place 2 sprays into both nostrils daily. (Patient taking differently: Place 2 sprays into both nostrils daily as needed for allergies. )  . lisinopril-hydrochlorothiazide (PRINZIDE,ZESTORETIC) 20-25 MG tablet TAKE 1 TABLET BY MOUTH ONCE DAILY IN THE MORNING (Patient taking differently: Take 1 tablet by mouth daily. )  . Multiple Vitamin (MULTIVITAMIN WITH MINERALS) TABS Take 1 tablet by mouth daily.  Marland Kitchen omeprazole (PRILOSEC) 20 MG capsule Take 1 capsule (20 mg total) by mouth 2 (two) times daily before a meal.  . oxyCODONE-acetaminophen (PERCOCET/ROXICET) 5-325 MG tablet Take 1-2 tablets by mouth every 6 (six) hours as needed for severe pain.  Marland Kitchen tiZANidine (ZANAFLEX) 2 MG tablet Take 1 tablet (2 mg total) by mouth every 8 (eight) hours as needed for muscle spasms.  Marland Kitchen  loratadine (CLARITIN REDITABS) 10 MG dissolvable tablet Take 1 tablet (10 mg total) by mouth every morning. (Patient not taking: Reported on 11/04/2017)  . ondansetron (ZOFRAN-ODT) 4 MG disintegrating tablet Take 1 tablet (4 mg total) by mouth every 8 (eight) hours as needed for nausea or vomiting. (Patient not taking: Reported on 11/19/2018)  . [DISCONTINUED] aspirin EC 325 MG tablet Take 1 tablet (325 mg total) by mouth 2 (two) times daily after a meal. Take x 1 month post op to decrease risk of blood clots.   . [DISCONTINUED] docusate sodium (COLACE) 100 MG capsule Take 1 capsule (100 mg total) by mouth 2 (two) times daily.   No facility-administered encounter medications on file as of 11/19/2018.      REVIEW OF SYSTEMS  : All other systems reviewed and negative except where noted in the History of Present Illness.   PHYSICAL EXAM: BP 98/74   Pulse 73   Temp 98.9 F (37.2 C)   Ht 5\' 1"  (1.549 m)   Wt 200 lb (90.7 kg)   BMI 37.79 kg/m  General: Well developed black female in no acute distress Head: Normocephalic and atraumatic Eyes:  Sclerae anicteric, conjunctiva pink. Ears: Normal auditory acuity Lungs: Clear throughout to auscultation; no increased WOB. Heart: Regular rate and rhythm; no M/R/G. Abdomen: Soft, non-distended.  BS present.  Mild epigastric TTP. Musculoskeletal: Symmetrical with no gross deformities  Skin: No lesions on visible extremities Extremities: No edema  Neurological: Alert oriented x 4, grossly non-focal Psychological:  Alert and cooperative. Normal mood and affect  ASSESSMENT AND PLAN: *Epigastric abdominal pain with nausea, vomiting:  Gastric ulcers seen on CT scan.  Says that she had ulcers years ago as well.  Was using BC powders frequently.  Needs EGD, will schedule with Dr. Hilarie Fredrickson.  Is only on omeprazole 20 mg daily.  Will increase to 40 mg BID for now.  Prescription sent.  Continue off of all NSAID's. *Screening colonoscopy: Last colonoscopy January 2012 was incomplete due to tortuous colon, but did reach the ascending colon.  Follow-up barium enema was unremarkable.  Discussed that she may want to consider repeat attempt at colonoscopy in the near future.  She would like to hold off for now.   CC:  Kelly[place]Coleman, [place], *

## 2018-11-20 NOTE — Progress Notes (Signed)
Addendum: Reviewed and agree with assessment and management plan. Would recommend Cologuard be offered for colon screening given hx of tortuous colon/incomplete colonoscopy. Geofrey Silliman, Lajuan Lines, MD

## 2018-11-25 DIAGNOSIS — M25661 Stiffness of right knee, not elsewhere classified: Secondary | ICD-10-CM | POA: Diagnosis not present

## 2018-11-25 DIAGNOSIS — Z96651 Presence of right artificial knee joint: Secondary | ICD-10-CM | POA: Diagnosis not present

## 2018-11-27 DIAGNOSIS — Z96651 Presence of right artificial knee joint: Secondary | ICD-10-CM | POA: Diagnosis not present

## 2018-11-27 DIAGNOSIS — M25661 Stiffness of right knee, not elsewhere classified: Secondary | ICD-10-CM | POA: Diagnosis not present

## 2018-11-28 DIAGNOSIS — M25561 Pain in right knee: Secondary | ICD-10-CM | POA: Diagnosis not present

## 2018-11-28 DIAGNOSIS — Z23 Encounter for immunization: Secondary | ICD-10-CM | POA: Diagnosis not present

## 2018-11-28 DIAGNOSIS — K219 Gastro-esophageal reflux disease without esophagitis: Secondary | ICD-10-CM | POA: Diagnosis not present

## 2018-11-28 DIAGNOSIS — I10 Essential (primary) hypertension: Secondary | ICD-10-CM | POA: Diagnosis not present

## 2018-12-18 ENCOUNTER — Other Ambulatory Visit: Payer: Self-pay

## 2018-12-18 ENCOUNTER — Ambulatory Visit
Admission: RE | Admit: 2018-12-18 | Discharge: 2018-12-18 | Disposition: A | Discharge status: Home / Self Care | Payer: Medicare Other | Source: Ambulatory Visit | Attending: Registered Nurse | Admitting: Registered Nurse

## 2018-12-18 DIAGNOSIS — Z1231 Encounter for screening mammogram for malignant neoplasm of breast: Secondary | ICD-10-CM

## 2019-03-31 ENCOUNTER — Ambulatory Visit: Payer: Medicare Other | Attending: Internal Medicine

## 2019-03-31 DIAGNOSIS — Z23 Encounter for immunization: Secondary | ICD-10-CM | POA: Insufficient documentation

## 2019-03-31 NOTE — Progress Notes (Signed)
   Covid-19 Vaccination Clinic  Name:  Briana Ortiz    MRN: [id number] DOB: 09-03-1950  03/31/2019  Briana Ortiz was observed post Covid-19 immunization for 15 minutes without incidence. She was provided with Vaccine Information Sheet and instruction to access the V-Safe system.   Briana Ortiz was instructed to call 911 with any severe reactions post vaccine: Marland Kitchen Difficulty breathing  . Swelling of your face and throat  . A fast heartbeat  . A bad rash all over your body  . Dizziness and weakness    Immunizations Administered    Name Date Dose VIS Date Route   Pfizer COVID-19 Vaccine 03/31/2019 12:13 PM 0.3 mL 01/10/2019 Intramuscular   Manufacturer: [place]   Lot: [id number]   [place]: [id number]

## 2019-04-23 ENCOUNTER — Ambulatory Visit: Payer: Medicare Other | Attending: Internal Medicine

## 2019-04-23 DIAGNOSIS — Z23 Encounter for immunization: Secondary | ICD-10-CM

## 2019-04-23 NOTE — Progress Notes (Signed)
   Covid-19 Vaccination Clinic  Name:  Briana Ortiz    MRN: [id number] DOB: October 16, 1950  04/23/2019  [name] was observed post Covid-19 immunization for 15 minutes without incident. She was provided with Vaccine Information Sheet and instruction to access the V-Safe system.   [name] was instructed to call 911 with any severe reactions post vaccine: Marland Kitchen Difficulty breathing  . Swelling of face and throat  . A fast heartbeat  . A bad rash all over body  . Dizziness and weakness   Immunizations Administered    Name Date Dose VIS Date Route   Pfizer COVID-19 Vaccine 04/23/2019 12:15 PM 0.3 mL 01/10/2019 Intramuscular   Manufacturer: [place]   Lot: [id number]   [place]: [id number]

## 2019-09-21 IMAGING — CT CT ABD-PELV W/ CM
2 of 5 series · 17 of 46 positions shown, 19 images · IV contrast (ISOVUE)
Comparison: None.

CLINICAL DATA: Lower abdominal pain, nausea, vomiting

EXAM:
CT ABDOMEN AND PELVIS WITH CONTRAST
TECHNIQUE: Multidetector CT imaging of the abdomen and pelvis was performed
using the standard protocol following bolus administration of
intravenous contrast.
CONTRAST:  100mL 9SECXC-8OO IOPAMIDOL (9SECXC-8OO) INJECTION 61%

[Series 3: axial st · axial · 0.98mm/px · z∈[+1039,+1439]mm · 14 of 92 slices shown, 16 images]
[im 6/92  soft-tissue]
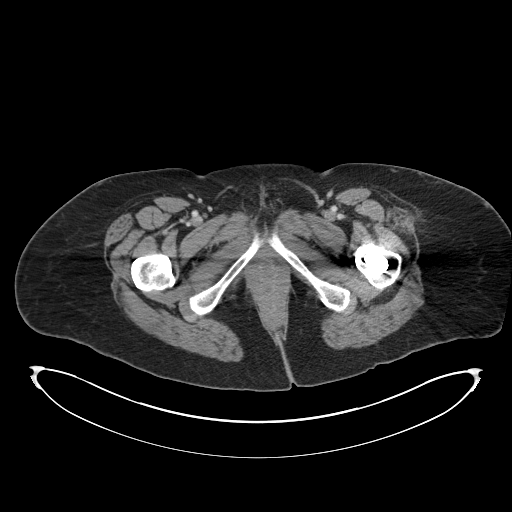
[im 6/92  bone]
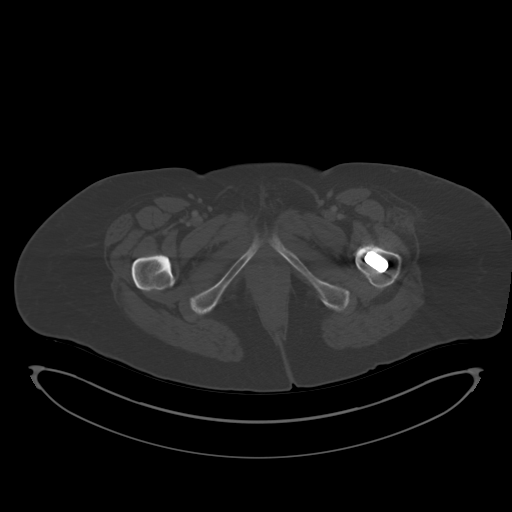
[im 12/92  soft-tissue]
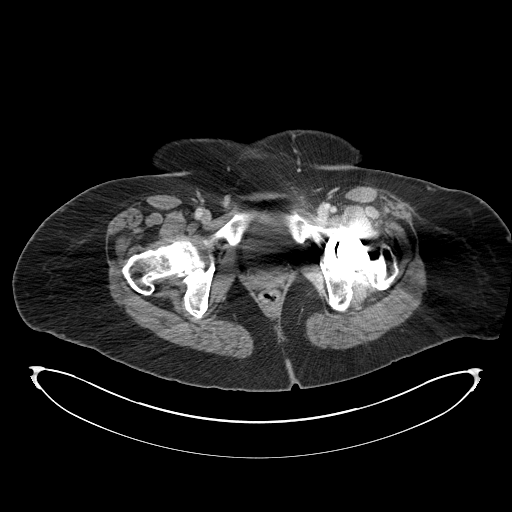
[im 18/92  soft-tissue]
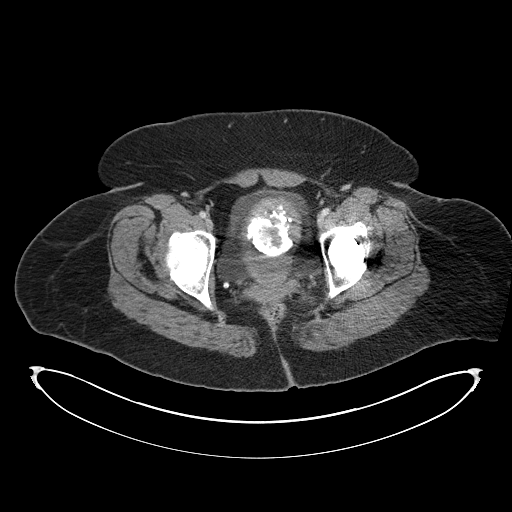
[im 23/92  soft-tissue]
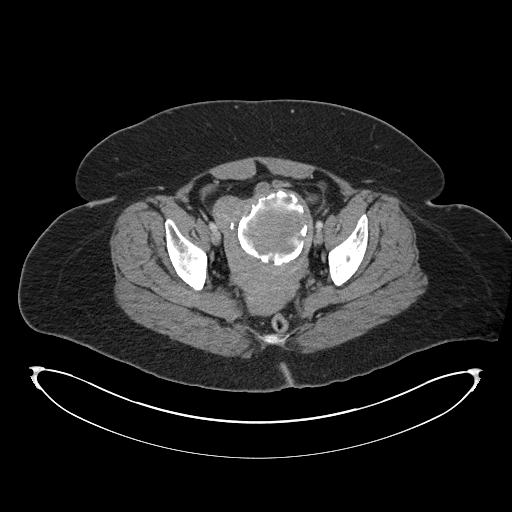
[im 29/92  soft-tissue]
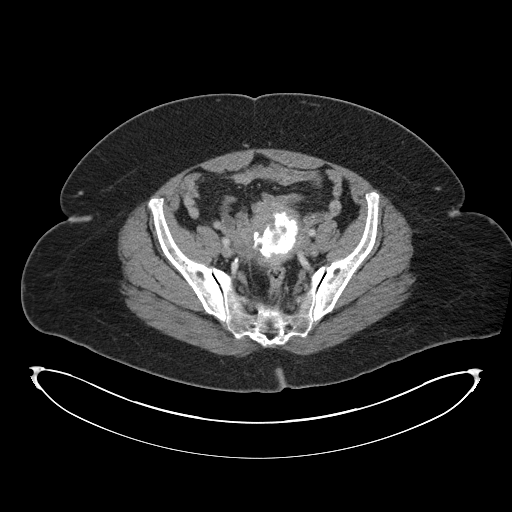
[im 35/92  soft-tissue]
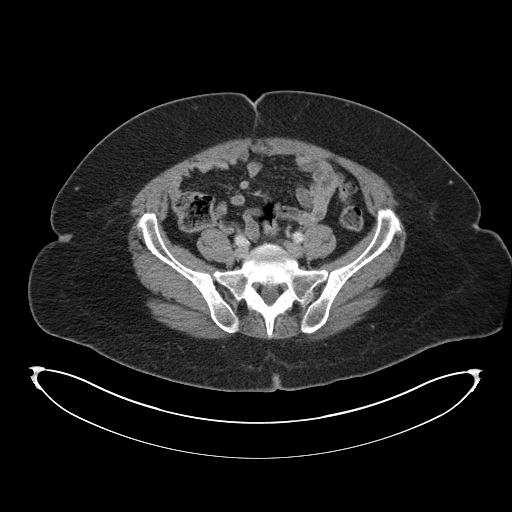
[im 40/92  soft-tissue]
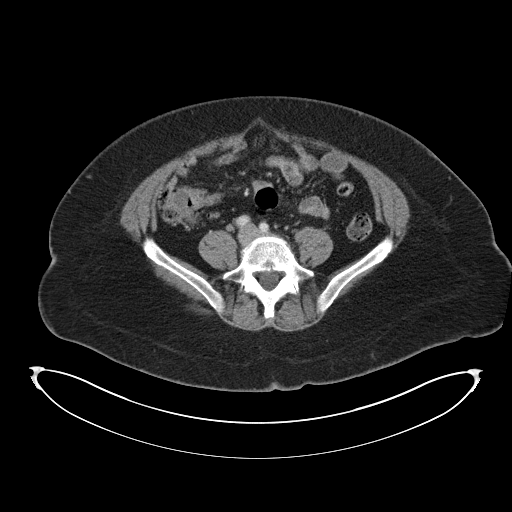
[im 52/92  soft-tissue]
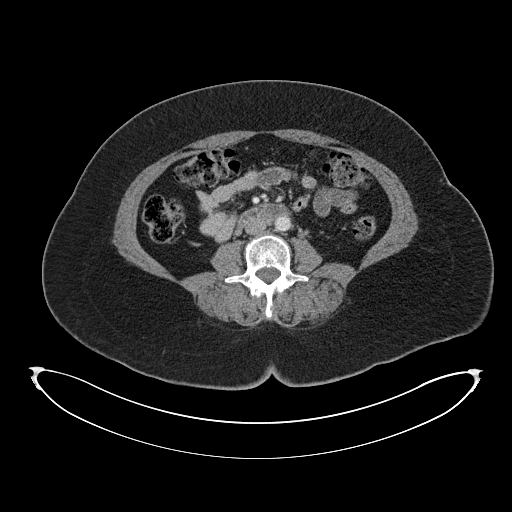
[im 57/92  soft-tissue]
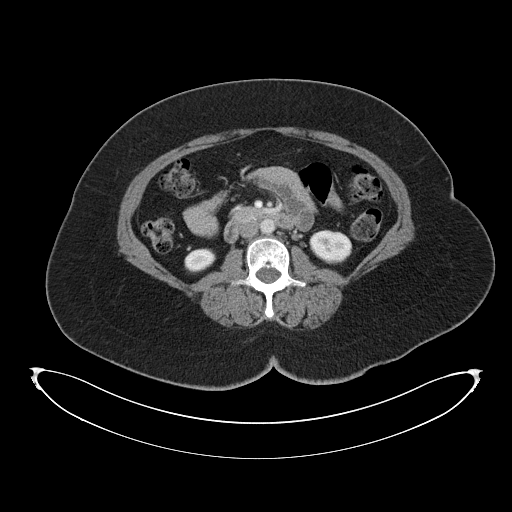
[im 57/92  bone]
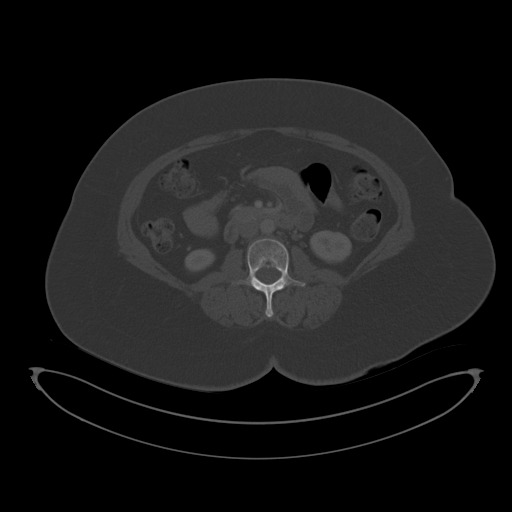
[im 63/92  soft-tissue]
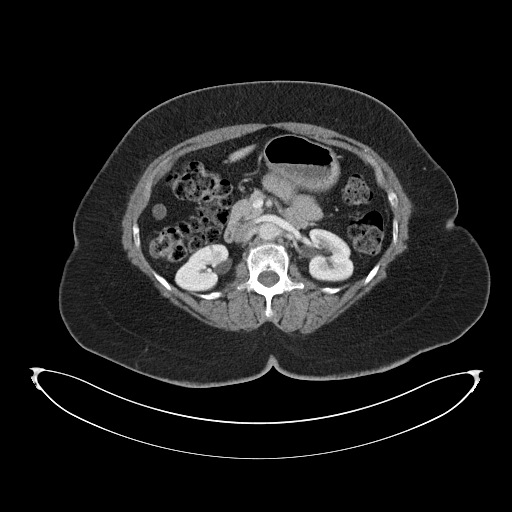
[im 69/92  soft-tissue]
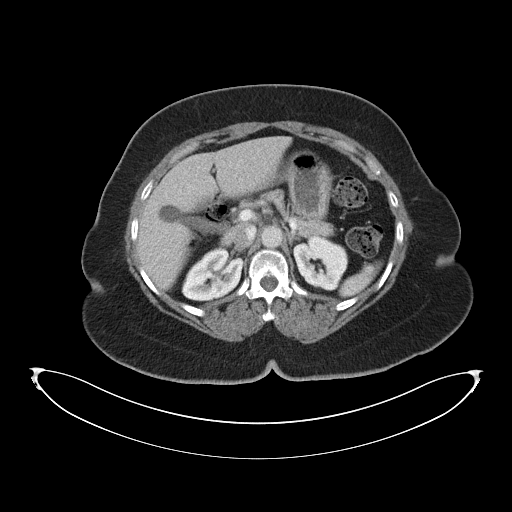
[im 74/92  soft-tissue]
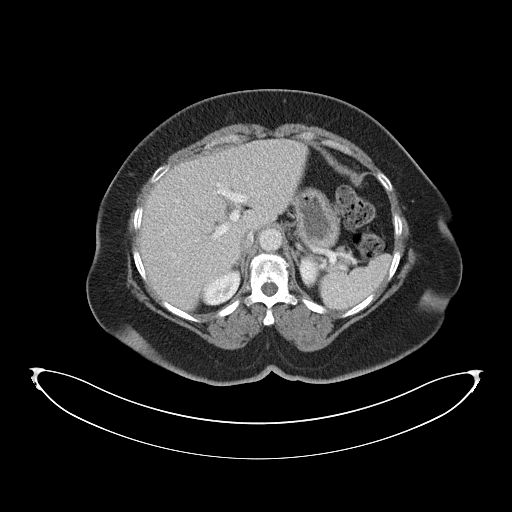
[im 80/92  soft-tissue]
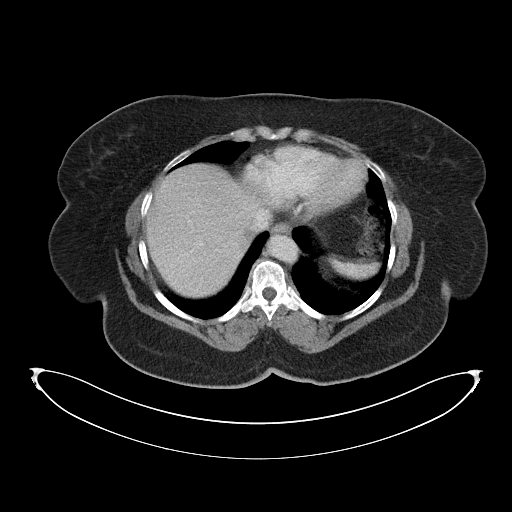
[im 86/92  soft-tissue]
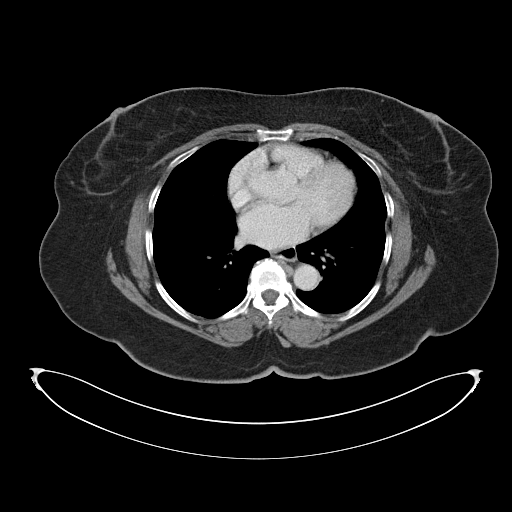

[Series 6: coronal st · coronal · 0.87mm/px · 3 of 107 slices shown]
[im 36/107  soft-tissue]
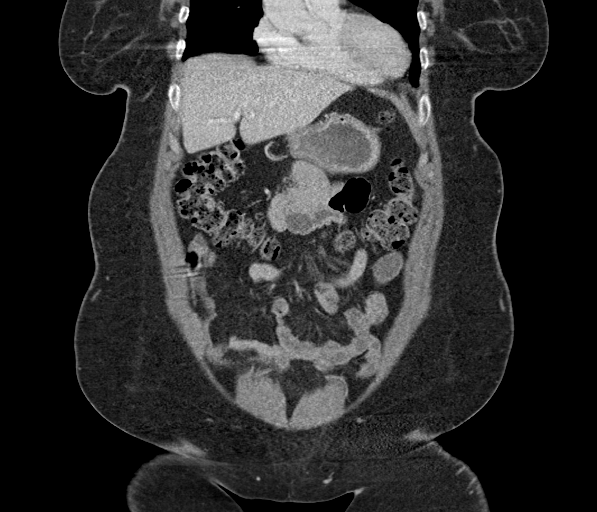
[im 48/107  soft-tissue]
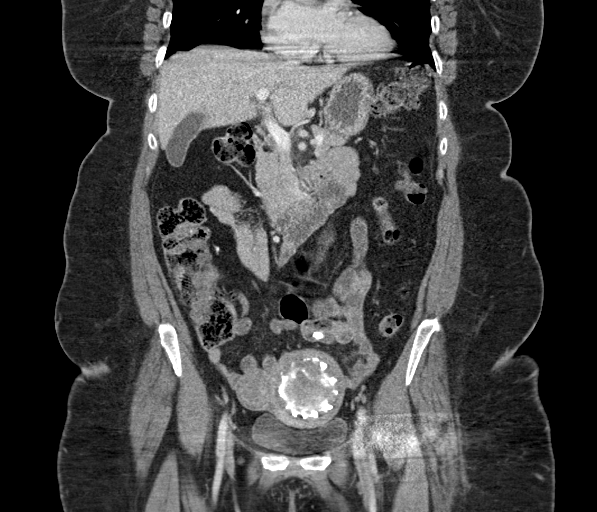
[im 59/107  soft-tissue]
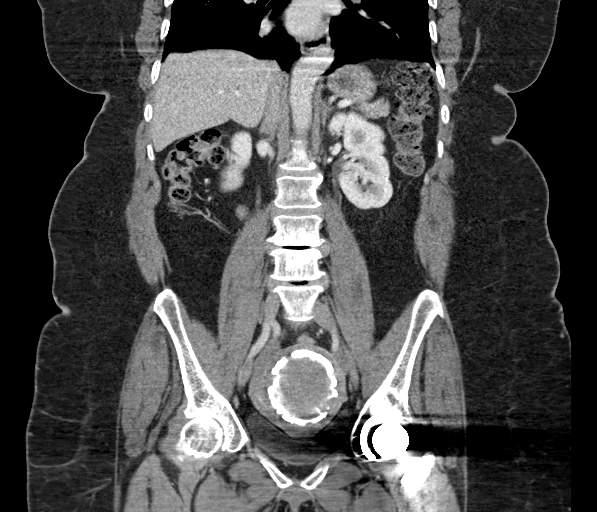

[17 of 46 positions shown; findings below may reference images not displayed]

FINDINGS: Lower chest: Linear areas of scarring or atelectasis in the lung
bases. Heart is normal size. No effusions.

Hepatobiliary: No focal hepatic abnormality. Gallbladder
unremarkable.

Pancreas: No focal abnormality or ductal dilatation.

Spleen: No focal abnormality.  Normal size.

Adrenals/Urinary Tract: No adrenal abnormality. No focal renal
abnormality. No stones or hydronephrosis. Urinary bladder is
unremarkable.

Stomach/Bowel: Stomach, large and small bowel grossly unremarkable.
Normal appendix.

Vascular/Lymphatic: No evidence of aneurysm or adenopathy.

Reproductive: 7 cm calcified fibroid centrally within the uterus. No
adnexal masses.

Other: No free fluid or free air.

Musculoskeletal: Prior left hip replacement. No acute bony
abnormality.
IMPRESSION: No acute findings in the abdomen or pelvis.

7 cm calcified fibroid.

## 2019-11-13 ENCOUNTER — Other Ambulatory Visit: Payer: Self-pay | Admitting: Registered Nurse

## 2019-11-13 DIAGNOSIS — Z1231 Encounter for screening mammogram for malignant neoplasm of breast: Secondary | ICD-10-CM

## 2019-12-19 ENCOUNTER — Ambulatory Visit: Payer: Medicare Other

## 2020-02-04 ENCOUNTER — Ambulatory Visit: Payer: Medicare Other

## 2020-03-15 ENCOUNTER — Inpatient Hospital Stay: Admission: RE | Admit: 2020-03-15 | Payer: Medicare Other | Source: Ambulatory Visit

## 2020-05-03 ENCOUNTER — Ambulatory Visit: Payer: Medicare Other

## 2020-06-21 ENCOUNTER — Other Ambulatory Visit: Payer: Self-pay

## 2020-06-21 ENCOUNTER — Ambulatory Visit
Admission: RE | Admit: 2020-06-21 | Discharge: 2020-06-21 | Disposition: A | Discharge status: Home / Self Care | Payer: Medicare Other | Source: Ambulatory Visit | Attending: Registered Nurse | Admitting: Registered Nurse

## 2020-06-21 DIAGNOSIS — Z1231 Encounter for screening mammogram for malignant neoplasm of breast: Secondary | ICD-10-CM

## 2020-07-05 ENCOUNTER — Other Ambulatory Visit: Payer: Self-pay | Admitting: Gastroenterology

## 2020-08-16 DIAGNOSIS — M6281 Muscle weakness (generalized): Secondary | ICD-10-CM | POA: Insufficient documentation

## 2020-08-16 DIAGNOSIS — Z741 Need for assistance with personal care: Secondary | ICD-10-CM | POA: Insufficient documentation

## 2020-08-30 ENCOUNTER — Ambulatory Visit
Admission: RE | Admit: 2020-08-30 | Discharge: 2020-08-30 | Disposition: A | Discharge status: Home / Self Care | Payer: Medicare Other | Source: Ambulatory Visit | Attending: Family Medicine | Admitting: Family Medicine

## 2020-08-30 ENCOUNTER — Other Ambulatory Visit: Payer: Self-pay | Admitting: Family Medicine

## 2020-08-30 DIAGNOSIS — M545 Low back pain, unspecified: Secondary | ICD-10-CM

## 2020-10-15 DIAGNOSIS — E559 Vitamin D deficiency, unspecified: Secondary | ICD-10-CM | POA: Insufficient documentation

## 2021-06-06 ENCOUNTER — Other Ambulatory Visit: Payer: Self-pay | Admitting: Family Medicine

## 2021-06-06 DIAGNOSIS — Z1231 Encounter for screening mammogram for malignant neoplasm of breast: Secondary | ICD-10-CM

## 2021-06-22 ENCOUNTER — Ambulatory Visit: Payer: Medicare Other

## 2021-08-09 ENCOUNTER — Ambulatory Visit
Admission: RE | Admit: 2021-08-09 | Discharge: 2021-08-09 | Disposition: A | Discharge status: Home / Self Care | Payer: Medicare Other | Source: Ambulatory Visit | Attending: Family Medicine | Admitting: Family Medicine

## 2021-08-09 DIAGNOSIS — Z1231 Encounter for screening mammogram for malignant neoplasm of breast: Secondary | ICD-10-CM

## 2021-08-17 ENCOUNTER — Ambulatory Visit (INDEPENDENT_AMBULATORY_CARE_PROVIDER_SITE_OTHER): Payer: Medicare Other

## 2021-08-17 ENCOUNTER — Telehealth: Payer: Self-pay | Admitting: Podiatry

## 2021-08-17 ENCOUNTER — Ambulatory Visit (INDEPENDENT_AMBULATORY_CARE_PROVIDER_SITE_OTHER): Payer: Medicare Other | Admitting: Podiatry

## 2021-08-17 ENCOUNTER — Encounter: Payer: Self-pay | Admitting: Podiatry

## 2021-08-17 DIAGNOSIS — M7752 Other enthesopathy of left foot: Secondary | ICD-10-CM

## 2021-08-17 DIAGNOSIS — M19079 Primary osteoarthritis, unspecified ankle and foot: Secondary | ICD-10-CM

## 2021-08-17 DIAGNOSIS — M79672 Pain in left foot: Secondary | ICD-10-CM

## 2021-08-17 DIAGNOSIS — M19072 Primary osteoarthritis, left ankle and foot: Secondary | ICD-10-CM | POA: Diagnosis not present

## 2021-08-17 NOTE — Progress Notes (Signed)
Subjective:  Patient ID: Briana Ortiz, female    DOB: 11/06/1950,  MRN: [id number]  Chief Complaint  Patient presents with   Foot Pain    Pain located in left foot, burning, numbness, and tingling, Left foot, x1 week pain, mid  fore ffoot     71 y.o. female presents with the above complaint.  Patient presents with left dorsal midfoot pain.  Patient states been going for quite some time burning numbness tingling noted.  Is been 4 weeks has progressive gotten worse.  She also has secondary complaint of left ankle pain.  Is been sore as well.  Hurts with ambulation she started with a midfoot and then started going to the ankle she denies any other acute complaints she has not seen anyone else prior to seeing me.   Review of Systems: Negative except as noted in the HPI. Denies N/V/F/Ch.  Past Medical History:  Diagnosis Date   Allergies    Arthritis    Dysrhythmia    GERD (gastroesophageal reflux disease)    Hypertension    Uterine fibroid     Current Outpatient Medications:    cetirizine (ZYRTEC ALLERGY) 10 MG tablet, Take 1 tablet (10 mg total) by mouth daily., Disp: 30 tablet, Rfl: 1   cyclobenzaprine (FLEXERIL) 10 MG tablet, Take 10 mg by mouth at bedtime as needed., Disp: , Rfl:    diclofenac Sodium (VOLTAREN) 1 % GEL, Apply 2 g topically 4 (four) times daily., Disp: , Rfl:    famotidine (PEPCID) 40 MG tablet, Take 40 mg by mouth at bedtime., Disp: , Rfl:    fluticasone (FLONASE) 50 MCG/ACT nasal spray, Place 2 sprays into both nostrils daily. (Patient taking differently: Place 2 sprays into both nostrils daily as needed for allergies.), Disp: 16 g, Rfl: 0   lisinopril-hydrochlorothiazide (PRINZIDE,ZESTORETIC) 20-25 MG tablet, TAKE 1 TABLET BY MOUTH ONCE DAILY IN THE MORNING (Patient taking differently: Take 1 tablet by mouth daily.), Disp: 90 tablet, Rfl: 2   Multiple Vitamin (MULTIVITAMIN WITH MINERALS) TABS, Take 1 tablet by mouth daily., Disp: , Rfl:    omeprazole  (PRILOSEC) 40 MG capsule, Take 1 capsule by mouth twice daily, Disp: 60 capsule, Rfl: 3   ondansetron (ZOFRAN-ODT) 4 MG disintegrating tablet, Take 1 tablet (4 mg total) by mouth every 8 (eight) hours as needed for nausea or vomiting., Disp: 8 tablet, Rfl: 0   oxyCODONE-acetaminophen (PERCOCET/ROXICET) 5-325 MG tablet, Take 1-2 tablets by mouth every 6 (six) hours as needed for severe pain., Disp: 40 tablet, Rfl: 0   tiZANidine (ZANAFLEX) 2 MG tablet, Take 1 tablet (2 mg total) by mouth every 8 (eight) hours as needed for muscle spasms., Disp: 40 tablet, Rfl: 0   loratadine (CLARITIN REDITABS) 10 MG dissolvable tablet, Take 1 tablet (10 mg total) by mouth every morning. (Patient not taking: Reported on 11/04/2017), Disp: 90 tablet, Rfl: 1  Social History   Tobacco Use  Smoking Status Never  Smokeless Tobacco Never    No Known Allergies Objective:  There were no vitals filed for this visit. There is no height or weight on file to calculate BMI. Constitutional Well developed. Well nourished.  Vascular Dorsalis pedis pulses palpable bilaterally. Posterior tibial pulses palpable bilaterally. Capillary refill normal to all digits.  No cyanosis or clubbing noted. Pedal hair growth normal.  Neurologic Normal speech. Oriented to person, place, and time. Epicritic sensation to light touch grossly present bilaterally.  Dermatologic Nails well groomed and normal in appearance. No open wounds. No skin  lesions.  Orthopedic: Pain on palpation to the left dorsal midfoot.  Negative pain at the Lisfranc interval of the spinal is intact.  Negative flexor tendinitis noted.  Pain at the ATFL ligament pain with plantarflexion of the foot.  No pain with dorsiflexion of the foot.   Radiographs: 3 views of skeletally mature adult left foot: No osseous break noted.  Midfoot arthritis noted.  No other bony abnormalities identified plantar posterior heel spurring noted. Assessment:   1. Arthritis of midfoot    2. Capsulitis of ankle, left    Plan:  Patient was evaluated and treated and all questions answered.  Left midfoot arthritis -All questions and concerns were discussed with the patient in extensive detail -Given the amount of pain that she is having she will benefit from a steroid injection to help decrease inflammatory component associate with pain.  She will also benefit from cam boot immobilization to allow the ankle to heal appropriately as well. -A steroid injection was performed at left dorsal midfoot using 1% plain Lidocaine and 10 mg of Kenalog. This was well tolerated. -Cam boot was dispensed -If there is no improvement we will discuss steroid injection the ATFL ligament as well.  No follow-ups on file.

## 2021-08-17 NOTE — Telephone Encounter (Signed)
Patient had an appt today and would like to know what you suggest her to take for pain. She states she was experiencing pain after she left her appt.   Please advise.

## 2021-08-18 ENCOUNTER — Other Ambulatory Visit: Payer: Self-pay | Admitting: Podiatry

## 2021-08-18 MED ORDER — MELOXICAM 15 MG PO TABS: 15.0000 mg | ORAL_TABLET | Freq: Every day | ORAL | 0 refills | Status: DC

## 2021-10-26 ENCOUNTER — Other Ambulatory Visit: Payer: Self-pay | Admitting: Podiatry

## 2021-10-26 DIAGNOSIS — M19079 Primary osteoarthritis, unspecified ankle and foot: Secondary | ICD-10-CM

## 2021-10-26 DIAGNOSIS — M7752 Other enthesopathy of left foot: Secondary | ICD-10-CM

## 2021-12-09 DIAGNOSIS — M79605 Pain in left leg: Secondary | ICD-10-CM | POA: Insufficient documentation

## 2022-04-06 ENCOUNTER — Ambulatory Visit (INDEPENDENT_AMBULATORY_CARE_PROVIDER_SITE_OTHER): Payer: 59

## 2022-04-06 ENCOUNTER — Other Ambulatory Visit: Payer: Self-pay | Admitting: Podiatry

## 2022-04-06 ENCOUNTER — Ambulatory Visit (INDEPENDENT_AMBULATORY_CARE_PROVIDER_SITE_OTHER): Payer: 59 | Admitting: Podiatry

## 2022-04-06 DIAGNOSIS — M19071 Primary osteoarthritis, right ankle and foot: Secondary | ICD-10-CM | POA: Diagnosis not present

## 2022-04-06 DIAGNOSIS — R52 Pain, unspecified: Secondary | ICD-10-CM

## 2022-04-06 DIAGNOSIS — M19079 Primary osteoarthritis, unspecified ankle and foot: Secondary | ICD-10-CM

## 2022-04-06 DIAGNOSIS — M19072 Primary osteoarthritis, left ankle and foot: Secondary | ICD-10-CM | POA: Diagnosis not present

## 2022-04-06 DIAGNOSIS — T148XXA Other injury of unspecified body region, initial encounter: Secondary | ICD-10-CM

## 2022-04-06 MED ORDER — METHYLPREDNISOLONE 4 MG PO TBPK: ORAL_TABLET | ORAL | 0 refills | Status: AC

## 2022-04-06 MED ORDER — MELOXICAM 15 MG PO TABS: 15.0000 mg | ORAL_TABLET | Freq: Every day | ORAL | 0 refills | Status: AC

## 2022-04-06 NOTE — Progress Notes (Signed)
Subjective:  Patient ID: Briana Ortiz, female    DOB: 1950/04/14,  MRN: [id number]  Chief Complaint  Patient presents with   Nail Problem    Nail trim     72 y.o. female presents with the above complaint.  Patient presents with complaint of generalized foot pain.  Patient states the whole foot hurts and ankle hurts.  Hurts with ambulation or shoe pressure came out of nowhere.  Is progressive gotten worse she went to get it evaluated.  She states that she has a boot at home which has not helped much.  She does not recall any injury.  Pain scale 7 out of 10.  Dull achy in nature.   Review of Systems: Negative except as noted in the HPI. Denies N/V/F/Ch.  Past Medical History:  Diagnosis Date   Allergies    Arthritis    Dysrhythmia    GERD (gastroesophageal reflux disease)    Hypertension    Uterine fibroid     Current Outpatient Medications:    methylPREDNISolone (MEDROL DOSEPAK) 4 MG TBPK tablet, Take as directed, Disp: 21 each, Rfl: 0   cetirizine (ZYRTEC ALLERGY) 10 MG tablet, Take 1 tablet (10 mg total) by mouth daily., Disp: 30 tablet, Rfl: 1   cyclobenzaprine (FLEXERIL) 10 MG tablet, Take 10 mg by mouth at bedtime as needed., Disp: , Rfl:    diclofenac Sodium (VOLTAREN) 1 % GEL, Apply 2 g topically 4 (four) times daily., Disp: , Rfl:    famotidine (PEPCID) 40 MG tablet, Take 40 mg by mouth at bedtime., Disp: , Rfl:    fluticasone (FLONASE) 50 MCG/ACT nasal spray, Place 2 sprays into both nostrils daily. (Patient taking differently: Place 2 sprays into both nostrils daily as needed for allergies.), Disp: 16 g, Rfl: 0   lisinopril-hydrochlorothiazide (PRINZIDE,ZESTORETIC) 20-25 MG tablet, TAKE 1 TABLET BY MOUTH ONCE DAILY IN THE MORNING (Patient taking differently: Take 1 tablet by mouth daily.), Disp: 90 tablet, Rfl: 2   loratadine (CLARITIN REDITABS) 10 MG dissolvable tablet, Take 1 tablet (10 mg total) by mouth every morning. (Patient not taking: Reported on 11/04/2017),  Disp: 90 tablet, Rfl: 1   meloxicam (MOBIC) 15 MG tablet, Take 1 tablet (15 mg total) by mouth daily., Disp: 30 tablet, Rfl: 0   Multiple Vitamin (MULTIVITAMIN WITH MINERALS) TABS, Take 1 tablet by mouth daily., Disp: , Rfl:    omeprazole (PRILOSEC) 40 MG capsule, Take 1 capsule by mouth twice daily, Disp: 60 capsule, Rfl: 3   ondansetron (ZOFRAN-ODT) 4 MG disintegrating tablet, Take 1 tablet (4 mg total) by mouth every 8 (eight) hours as needed for nausea or vomiting., Disp: 8 tablet, Rfl: 0   oxyCODONE-acetaminophen (PERCOCET/ROXICET) 5-325 MG tablet, Take 1-2 tablets by mouth every 6 (six) hours as needed for severe pain., Disp: 40 tablet, Rfl: 0   tiZANidine (ZANAFLEX) 2 MG tablet, Take 1 tablet (2 mg total) by mouth every 8 (eight) hours as needed for muscle spasms., Disp: 40 tablet, Rfl: 0  Social History   Tobacco Use  Smoking Status Never  Smokeless Tobacco Never    No Known Allergies Objective:  There were no vitals filed for this visit. There is no height or weight on file to calculate BMI. Constitutional Well developed. Well nourished.  Vascular Dorsalis pedis pulses palpable bilaterally. Posterior tibial pulses palpable bilaterally. Capillary refill normal to all digits.  No cyanosis or clubbing noted. Pedal hair growth normal.  Neurologic Normal speech. Oriented to person, place, and time. Epicritic sensation to  light touch grossly present bilaterally.  Dermatologic Nails well groomed and normal in appearance. No open wounds. No skin lesions.  Orthopedic: Generalized foot and ankle pain noted from ankle all the way down to the toes.  Pain on palpation Achilles tendon peroneal tendon posterior tibial tendon ankle joint dorsal midfoot forefoot.   Radiographs: 3 views of skeletally mature adult left foot: Arthritic changes noted no fracture was noted.No gross bony abnormalities identified.  Pes planovalgus foot structure noted plantar heel spurring noted midfoot arthritis  noted Assessment:   1. Arthritis of midfoot   2. Generalized pain   3. Contusion of soft tissue    Plan:  Patient was evaluated and treated and all questions answered.  Left generalized pain/soft tissue contusion -All questions and concerns were discussed with the patient in extensive detail.  Given that she is having a lot of generalized pain without any localized tenderness I believe she will benefit from continuing using cam boot immobilization allow the soft tissue structure to heal appropriately.  She does not want to wear cam boot at this time she would like to do a surgical shoe with surgical shoe was dispensed I encouraged her to wear it is much as possible even though this may not help with her pain she states understanding -I also believe she will benefit from Medrol Dosepak and meloxicam to help decrease inflammation.  She states understanding will start taking  No follow-ups on file.

## 2022-04-07 ENCOUNTER — Other Ambulatory Visit: Payer: Self-pay | Admitting: Family Medicine

## 2022-04-07 DIAGNOSIS — M1612 Unilateral primary osteoarthritis, left hip: Secondary | ICD-10-CM

## 2022-04-14 ENCOUNTER — Telehealth: Payer: Self-pay | Admitting: Podiatry

## 2022-04-14 NOTE — Telephone Encounter (Signed)
Patient lvm on the nurses line stating her left foot is still hurting near the top of the foot and she was seen about a week ago.  She is still unable to put on a shoe without severe pain.  Please advise

## 2022-04-17 ENCOUNTER — Telehealth: Payer: Self-pay | Admitting: *Deleted

## 2022-04-17 MED ORDER — OXYCODONE-ACETAMINOPHEN 5-325 MG PO TABS: 1.0000 | ORAL_TABLET | ORAL | 0 refills | Status: AC | PRN

## 2022-04-17 NOTE — Telephone Encounter (Signed)
Patient is calling back and would like a prescription for pain medicine,medrol dosepak did not help, please send to pharmacy on file.

## 2022-05-19 ENCOUNTER — Other Ambulatory Visit: Payer: Self-pay | Admitting: Podiatry

## 2022-05-24 ENCOUNTER — Ambulatory Visit (INDEPENDENT_AMBULATORY_CARE_PROVIDER_SITE_OTHER): Payer: 59 | Admitting: Podiatry

## 2022-05-24 DIAGNOSIS — M19072 Primary osteoarthritis, left ankle and foot: Secondary | ICD-10-CM

## 2022-05-24 DIAGNOSIS — M778 Other enthesopathies, not elsewhere classified: Secondary | ICD-10-CM

## 2022-05-24 NOTE — Progress Notes (Signed)
Subjective:  Patient ID: Briana Ortiz, female    DOB: 1950-07-16,  MRN: [id number]  Chief Complaint  Patient presents with   Arthritis    72 y.o. female presents with the above complaint.  Patient presents for follow-up of left midfoot pain.  She states the pain has been more localized.  The cam boot helps some but has not helped her a lot.  She would like to discuss next treatment plan.   Review of Systems: Negative except as noted in the HPI. Denies N/V/F/Ch.  Past Medical History:  Diagnosis Date   Allergies    Arthritis    Dysrhythmia    GERD (gastroesophageal reflux disease)    Hypertension    Uterine fibroid     Current Outpatient Medications:    acetaminophen-codeine (TYLENOL #3) 300-30 MG tablet, Take 1-2 tablets by mouth every 4 (four) hours as needed for moderate pain., Disp: 30 tablet, Rfl: 0   cetirizine (ZYRTEC ALLERGY) 10 MG tablet, Take 1 tablet (10 mg total) by mouth daily., Disp: 30 tablet, Rfl: 1   cyclobenzaprine (FLEXERIL) 10 MG tablet, Take 10 mg by mouth at bedtime as needed., Disp: , Rfl:    diclofenac Sodium (VOLTAREN) 1 % GEL, Apply 2 g topically 4 (four) times daily., Disp: , Rfl:    famotidine (PEPCID) 40 MG tablet, Take 40 mg by mouth at bedtime., Disp: , Rfl:    fluticasone (FLONASE) 50 MCG/ACT nasal spray, Place 2 sprays into both nostrils daily. (Patient taking differently: Place 2 sprays into both nostrils daily as needed for allergies.), Disp: 16 g, Rfl: 0   lisinopril-hydrochlorothiazide (PRINZIDE,ZESTORETIC) 20-25 MG tablet, TAKE 1 TABLET BY MOUTH ONCE DAILY IN THE MORNING (Patient taking differently: Take 1 tablet by mouth daily.), Disp: 90 tablet, Rfl: 2   loratadine (CLARITIN REDITABS) 10 MG dissolvable tablet, Take 1 tablet (10 mg total) by mouth every morning. (Patient not taking: Reported on 11/04/2017), Disp: 90 tablet, Rfl: 1   meloxicam (MOBIC) 15 MG tablet, Take 1 tablet (15 mg total) by mouth daily., Disp: 30 tablet, Rfl: 0    methylPREDNISolone (MEDROL DOSEPAK) 4 MG TBPK tablet, Take as directed, Disp: 21 each, Rfl: 0   Multiple Vitamin (MULTIVITAMIN WITH MINERALS) TABS, Take 1 tablet by mouth daily., Disp: , Rfl:    omeprazole (PRILOSEC) 40 MG capsule, Take 1 capsule by mouth twice daily, Disp: 60 capsule, Rfl: 3   ondansetron (ZOFRAN-ODT) 4 MG disintegrating tablet, Take 1 tablet (4 mg total) by mouth every 8 (eight) hours as needed for nausea or vomiting., Disp: 8 tablet, Rfl: 0   oxyCODONE-acetaminophen (PERCOCET) 5-325 MG tablet, Take 1 tablet by mouth every 4 (four) hours as needed for severe pain., Disp: 30 tablet, Rfl: 0   oxyCODONE-acetaminophen (PERCOCET/ROXICET) 5-325 MG tablet, Take 1-2 tablets by mouth every 6 (six) hours as needed for severe pain., Disp: 40 tablet, Rfl: 0   tiZANidine (ZANAFLEX) 2 MG tablet, Take 1 tablet (2 mg total) by mouth every 8 (eight) hours as needed for muscle spasms., Disp: 40 tablet, Rfl: 0  Social History   Tobacco Use  Smoking Status Never  Smokeless Tobacco Never    No Known Allergies Objective:  There were no vitals filed for this visit. There is no height or weight on file to calculate BMI. Constitutional Well developed. Well nourished.  Vascular Dorsalis pedis pulses palpable bilaterally. Posterior tibial pulses palpable bilaterally. Capillary refill normal to all digits.  No cyanosis or clubbing noted. Pedal hair growth normal.  Neurologic  Normal speech. Oriented to person, place, and time. Epicritic sensation to light touch grossly present bilaterally.  Dermatologic Nails well groomed and normal in appearance. No open wounds. No skin lesions.  Orthopedic: Generalized foot and ankle pain noted from ankle all the way down to the toes.  Pain on palpation Achilles tendon peroneal tendon posterior tibial tendon ankle joint dorsal midfoot forefoot.   Radiographs: 3 views of skeletally mature adult left foot: Arthritic changes noted no fracture was noted.No  gross bony abnormalities identified.  Pes planovalgus foot structure noted plantar heel spurring noted midfoot arthritis noted Assessment:   1. Arthritis of left midfoot   2. Capsulitis of left foot     Plan:  Patient was evaluated and treated and all questions answered.  Left generalized pain/soft tissue contusion/midfoot capsulitis -All questions and concerns were discussed with the patient in extensive detail.  -Clinically patient did not have any improvement with cam boot immobilization therefore I believe she will benefit from steroid injection of decrease inflammatory component associate with pain.  Patient agrees with plan like to proceed with steroid injection -A steroid injection was performed at left dorsal midfoot using 1% plain Lidocaine and 10 mg of Kenalog. This was well tolerated. -Also discussed shoe gear modification with the patient encouraged her to discontinue the boot at this time   No follow-ups on file.

## 2022-06-01 ENCOUNTER — Telehealth: Payer: Self-pay | Admitting: *Deleted

## 2022-06-01 ENCOUNTER — Other Ambulatory Visit: Payer: Self-pay | Admitting: Podiatry

## 2022-06-01 MED ORDER — ACETAMINOPHEN-CODEINE 300-30 MG PO TABS: 1.0000 | ORAL_TABLET | ORAL | 0 refills | Status: AC | PRN

## 2022-06-01 NOTE — Telephone Encounter (Signed)
Patient is calling and is still having the foot pain that has returned, can something be called in for the discomfort, suggested that she rest the foot ,elevation,please advise.

## 2022-06-01 NOTE — Telephone Encounter (Signed)
Patient updated.

## 2022-06-06 ENCOUNTER — Encounter: Payer: Self-pay | Admitting: Podiatry

## 2022-07-04 ENCOUNTER — Other Ambulatory Visit: Payer: Self-pay | Admitting: Family Medicine

## 2022-07-04 DIAGNOSIS — Z1231 Encounter for screening mammogram for malignant neoplasm of breast: Secondary | ICD-10-CM

## 2022-07-18 ENCOUNTER — Other Ambulatory Visit: Payer: Self-pay | Admitting: Family Medicine

## 2022-07-18 DIAGNOSIS — Z13820 Encounter for screening for osteoporosis: Secondary | ICD-10-CM

## 2022-08-11 ENCOUNTER — Ambulatory Visit: Payer: Medicare Other

## 2022-09-21 ENCOUNTER — Ambulatory Visit
Admission: RE | Admit: 2022-09-21 | Discharge: 2022-09-21 | Disposition: A | Discharge status: Home / Self Care | Payer: 59 | Source: Ambulatory Visit | Attending: Family Medicine | Admitting: Family Medicine

## 2022-09-21 DIAGNOSIS — Z1231 Encounter for screening mammogram for malignant neoplasm of breast: Secondary | ICD-10-CM

## 2022-09-22 ENCOUNTER — Ambulatory Visit (INDEPENDENT_AMBULATORY_CARE_PROVIDER_SITE_OTHER): Payer: 59 | Admitting: Podiatry

## 2022-09-22 ENCOUNTER — Encounter: Payer: Self-pay | Admitting: Podiatry

## 2022-09-22 DIAGNOSIS — M19072 Primary osteoarthritis, left ankle and foot: Secondary | ICD-10-CM | POA: Diagnosis not present

## 2022-09-22 NOTE — Progress Notes (Signed)
Subjective:  Patient ID: Briana Ortiz, female    DOB: July 22, 1950,  MRN: [id number]  Chief Complaint  Patient presents with   Arthritis    72 y.o. female presents with the above complaint.  Patient presents for follow-up of left midfoot pain.  She states the pain has been more localized.  She would like to do injection she denies any other acute complaints injection in the past has helped   Review of Systems: Negative except as noted in the HPI. Denies N/V/F/Ch.  Past Medical History:  Diagnosis Date   Allergies    Arthritis    Dysrhythmia    GERD (gastroesophageal reflux disease)    Hypertension    Uterine fibroid     Current Outpatient Medications:    gabapentin (NEURONTIN) 300 MG capsule, TAKE 1 CAPSULE BY MOUTH NIGHTLY AT BEDTIME, Disp: , Rfl:    Misc. Devices (ROLLATOR ULTRA-LIGHT) MISC, Use for support, balance  and when walking, Disp: , Rfl:    NIFEdipine (PROCARDIA-XL/NIFEDICAL-XL) 30 MG 24 hr tablet, Take 1 tablet by mouth daily., Disp: , Rfl:    acetaminophen-codeine (TYLENOL #3) 300-30 MG tablet, Take 1-2 tablets by mouth every 4 (four) hours as needed for moderate pain., Disp: 30 tablet, Rfl: 0   cetirizine (ZYRTEC ALLERGY) 10 MG tablet, Take 1 tablet (10 mg total) by mouth daily., Disp: 30 tablet, Rfl: 1   cyclobenzaprine (FLEXERIL) 10 MG tablet, Take 10 mg by mouth at bedtime as needed., Disp: , Rfl:    diclofenac Sodium (VOLTAREN) 1 % GEL, Apply 2 g topically 4 (four) times daily., Disp: , Rfl:    famotidine (PEPCID) 40 MG tablet, Take 40 mg by mouth at bedtime., Disp: , Rfl:    fluticasone (FLONASE) 50 MCG/ACT nasal spray, Place 2 sprays into both nostrils daily. (Patient taking differently: Place 2 sprays into both nostrils daily as needed for allergies.), Disp: 16 g, Rfl: 0   lisinopril-hydrochlorothiazide (PRINZIDE,ZESTORETIC) 20-25 MG tablet, TAKE 1 TABLET BY MOUTH ONCE DAILY IN THE MORNING (Patient taking differently: Take 1 tablet by mouth daily.), Disp: 90  tablet, Rfl: 2   loratadine (CLARITIN REDITABS) 10 MG dissolvable tablet, Take 1 tablet (10 mg total) by mouth every morning. (Patient not taking: Reported on 11/04/2017), Disp: 90 tablet, Rfl: 1   meloxicam (MOBIC) 15 MG tablet, Take 1 tablet (15 mg total) by mouth daily., Disp: 30 tablet, Rfl: 0   methylPREDNISolone (MEDROL DOSEPAK) 4 MG TBPK tablet, Take as directed, Disp: 21 each, Rfl: 0   Multiple Vitamin (MULTIVITAMIN WITH MINERALS) TABS, Take 1 tablet by mouth daily., Disp: , Rfl:    omeprazole (PRILOSEC) 40 MG capsule, Take 1 capsule by mouth twice daily, Disp: 60 capsule, Rfl: 3   ondansetron (ZOFRAN-ODT) 4 MG disintegrating tablet, Take 1 tablet (4 mg total) by mouth every 8 (eight) hours as needed for nausea or vomiting., Disp: 8 tablet, Rfl: 0   oxyCODONE-acetaminophen (PERCOCET) 5-325 MG tablet, Take 1 tablet by mouth every 4 (four) hours as needed for severe pain., Disp: 30 tablet, Rfl: 0   oxyCODONE-acetaminophen (PERCOCET/ROXICET) 5-325 MG tablet, Take 1-2 tablets by mouth every 6 (six) hours as needed for severe pain., Disp: 40 tablet, Rfl: 0   penicillin v potassium (VEETID) 500 MG tablet, Take by mouth., Disp: , Rfl:    prednisoLONE acetate (PRED FORTE) 1 % ophthalmic suspension, INSTILL 1 DROP INTO EACH EYE THREE TIMES DAILY FOR 5 DAYS, Disp: , Rfl:    tiZANidine (ZANAFLEX) 2 MG tablet, Take 1 tablet (  2 mg total) by mouth every 8 (eight) hours as needed for muscle spasms., Disp: 40 tablet, Rfl: 0  Social History   Tobacco Use  Smoking Status Never  Smokeless Tobacco Never    Allergies  Allergen Reactions   Other    Objective:  There were no vitals filed for this visit. There is no height or weight on file to calculate BMI. Constitutional Well developed. Well nourished.  Vascular Dorsalis pedis pulses palpable bilaterally. Posterior tibial pulses palpable bilaterally. Capillary refill normal to all digits.  No cyanosis or clubbing noted. Pedal hair growth normal.   Neurologic Normal speech. Oriented to person, place, and time. Epicritic sensation to light touch grossly present bilaterally.  Dermatologic Nails well groomed and normal in appearance. No open wounds. No skin lesions.  Orthopedic: Generalized foot and ankle pain noted from ankle all the way down to the toes.  Pain on palpation Achilles tendon peroneal tendon posterior tibial tendon ankle joint dorsal midfoot forefoot.   Radiographs: 3 views of skeletally mature adult left foot: Arthritic changes noted no fracture was noted.No gross bony abnormalities identified.  Pes planovalgus foot structure noted plantar heel spurring noted midfoot arthritis noted Assessment:   No diagnosis found.   Plan:  Patient was evaluated and treated and all questions answered.  Left midfoot capsulitis -All questions and concerns were discussed with the patient in extensive detail.  -Clinically patient did not have any improvement with cam boot immobilization therefore I believe she will benefit from steroid injection of decrease inflammatory component associate with pain.  Patient agrees with plan like to proceed with steroid injection -Another steroid injection was performed at left dorsal midfoot using 1% plain Lidocaine and 10 mg of Kenalog. This was well tolerated. -Also discussed shoe gear modification with the patient encouraged her to discontinue the boot at this time   No follow-ups on file.

## 2022-10-19 ENCOUNTER — Other Ambulatory Visit: Payer: Self-pay | Admitting: *Deleted

## 2022-10-19 DIAGNOSIS — I8393 Asymptomatic varicose veins of bilateral lower extremities: Secondary | ICD-10-CM

## 2022-10-26 ENCOUNTER — Ambulatory Visit (HOSPITAL_COMMUNITY): Payer: 59

## 2022-10-30 ENCOUNTER — Encounter: Payer: 59 | Admitting: Vascular Surgery

## 2022-11-07 ENCOUNTER — Ambulatory Visit (HOSPITAL_COMMUNITY)
Admission: RE | Admit: 2022-11-07 | Discharge: 2022-11-07 | Disposition: A | Discharge status: Home / Self Care | Payer: 59 | Source: Ambulatory Visit | Attending: Vascular Surgery | Admitting: Vascular Surgery

## 2022-11-07 DIAGNOSIS — I8393 Asymptomatic varicose veins of bilateral lower extremities: Secondary | ICD-10-CM | POA: Insufficient documentation

## 2022-12-13 ENCOUNTER — Ambulatory Visit (INDEPENDENT_AMBULATORY_CARE_PROVIDER_SITE_OTHER): Payer: 59 | Admitting: Podiatry

## 2022-12-13 ENCOUNTER — Encounter: Payer: Self-pay | Admitting: Podiatry

## 2022-12-13 VITALS — Ht 61.0 in | Wt 200.0 lb

## 2022-12-13 DIAGNOSIS — M19072 Primary osteoarthritis, left ankle and foot: Secondary | ICD-10-CM | POA: Diagnosis not present

## 2022-12-13 DIAGNOSIS — M19071 Primary osteoarthritis, right ankle and foot: Secondary | ICD-10-CM

## 2022-12-13 NOTE — Progress Notes (Signed)
Subjective:  Patient ID: Briana Ortiz, female    DOB: 03-12-1950,  MRN: [id number]  Chief Complaint  Patient presents with   Foot Pain    Pt is here due to bilateral foot pain, receiving injections, and RFC    72 y.o. female presents with the above complaint.  Patient presents for follow-up of left midfoot pain.  She states the pain has been more localized.  She states injection helped she is here for another 1.  Gives her a few months  Review of Systems: Negative except as noted in the HPI. Denies N/V/F/Ch.  Past Medical History:  Diagnosis Date   Allergies    Arthritis    Dysrhythmia    GERD (gastroesophageal reflux disease)    Hypertension    Uterine fibroid     Current Outpatient Medications:    acetaminophen-codeine (TYLENOL #3) 300-30 MG tablet, Take 1-2 tablets by mouth every 4 (four) hours as needed for moderate pain., Disp: 30 tablet, Rfl: 0   cetirizine (ZYRTEC ALLERGY) 10 MG tablet, Take 1 tablet (10 mg total) by mouth daily., Disp: 30 tablet, Rfl: 1   cyclobenzaprine (FLEXERIL) 10 MG tablet, Take 10 mg by mouth at bedtime as needed., Disp: , Rfl:    diclofenac Sodium (VOLTAREN) 1 % GEL, Apply 2 g topically 4 (four) times daily., Disp: , Rfl:    famotidine (PEPCID) 40 MG tablet, Take 40 mg by mouth at bedtime., Disp: , Rfl:    fluticasone (FLONASE) 50 MCG/ACT nasal spray, Place 2 sprays into both nostrils daily. (Patient taking differently: Place 2 sprays into both nostrils daily as needed for allergies.), Disp: 16 g, Rfl: 0   gabapentin (NEURONTIN) 300 MG capsule, TAKE 1 CAPSULE BY MOUTH NIGHTLY AT BEDTIME, Disp: , Rfl:    lisinopril-hydrochlorothiazide (PRINZIDE,ZESTORETIC) 20-25 MG tablet, TAKE 1 TABLET BY MOUTH ONCE DAILY IN THE MORNING (Patient taking differently: Take 1 tablet by mouth daily.), Disp: 90 tablet, Rfl: 2   meloxicam (MOBIC) 15 MG tablet, Take 1 tablet (15 mg total) by mouth daily., Disp: 30 tablet, Rfl: 0   methylPREDNISolone (MEDROL DOSEPAK) 4 MG  TBPK tablet, Take as directed, Disp: 21 each, Rfl: 0   Misc. Devices (ROLLATOR ULTRA-LIGHT) MISC, Use for support, balance  and when walking, Disp: , Rfl:    Multiple Vitamin (MULTIVITAMIN WITH MINERALS) TABS, Take 1 tablet by mouth daily., Disp: , Rfl:    NIFEdipine (PROCARDIA-XL/NIFEDICAL-XL) 30 MG 24 hr tablet, Take 1 tablet by mouth daily., Disp: , Rfl:    omeprazole (PRILOSEC) 40 MG capsule, Take 1 capsule by mouth twice daily, Disp: 60 capsule, Rfl: 3   ondansetron (ZOFRAN-ODT) 4 MG disintegrating tablet, Take 1 tablet (4 mg total) by mouth every 8 (eight) hours as needed for nausea or vomiting., Disp: 8 tablet, Rfl: 0   oxyCODONE-acetaminophen (PERCOCET) 5-325 MG tablet, Take 1 tablet by mouth every 4 (four) hours as needed for severe pain., Disp: 30 tablet, Rfl: 0   oxyCODONE-acetaminophen (PERCOCET/ROXICET) 5-325 MG tablet, Take 1-2 tablets by mouth every 6 (six) hours as needed for severe pain., Disp: 40 tablet, Rfl: 0   penicillin v potassium (VEETID) 500 MG tablet, Take by mouth., Disp: , Rfl:    prednisoLONE acetate (PRED FORTE) 1 % ophthalmic suspension, INSTILL 1 DROP INTO EACH EYE THREE TIMES DAILY FOR 5 DAYS, Disp: , Rfl:    tiZANidine (ZANAFLEX) 2 MG tablet, Take 1 tablet (2 mg total) by mouth every 8 (eight) hours as needed for muscle spasms., Disp: 40 tablet,  Rfl: 0   loratadine (CLARITIN REDITABS) 10 MG dissolvable tablet, Take 1 tablet (10 mg total) by mouth every morning. (Patient not taking: Reported on 11/04/2017), Disp: 90 tablet, Rfl: 1  Social History   Tobacco Use  Smoking Status Never  Smokeless Tobacco Never    Allergies  Allergen Reactions   Other    Objective:  There were no vitals filed for this visit. Body mass index is 37.79 kg/m. Constitutional Well developed. Well nourished.  Vascular Dorsalis pedis pulses palpable bilaterally. Posterior tibial pulses palpable bilaterally. Capillary refill normal to all digits.  No cyanosis or clubbing  noted. Pedal hair growth normal.  Neurologic Normal speech. Oriented to person, place, and time. Epicritic sensation to light touch grossly present bilaterally.  Dermatologic Nails well groomed and normal in appearance. No open wounds. No skin lesions.  Orthopedic: Bilateral midfoot arthritis noted.  Pain on palpation to the dorsal foot.  No pain at the Lisfranc ligament no pain at the Lisfranc interval.   Radiographs: 3 views of skeletally mature adult left foot: Arthritic changes noted no fracture was noted.No gross bony abnormalities identified.  Pes planovalgus foot structure noted plantar heel spurring noted midfoot arthritis noted Assessment:   No diagnosis found.   Plan:  Patient was evaluated and treated and all questions answered.  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 -Another steroid injection was performed at bilateral dorsal midfoot using 1% plain Lidocaine and 10 mg of Kenalog. This was well tolerated. -Also discussed shoe gear modification with the patient encouraged her to discontinue the boot at this time   No follow-ups on file.

## 2022-12-15 ENCOUNTER — Other Ambulatory Visit: Payer: Self-pay | Admitting: Family Medicine

## 2022-12-15 DIAGNOSIS — Z13820 Encounter for screening for osteoporosis: Secondary | ICD-10-CM

## 2023-01-11 ENCOUNTER — Ambulatory Visit: Payer: 59 | Admitting: Physician Assistant

## 2023-01-11 VITALS — BP 123/72 | HR 56 | Temp 97.1°F | Resp 22 | Ht 61.0 in | Wt 196.5 lb

## 2023-01-11 DIAGNOSIS — I83892 Varicose veins of left lower extremities with other complications: Secondary | ICD-10-CM | POA: Diagnosis not present

## 2023-01-11 NOTE — Progress Notes (Addendum)
[facility]           OF [place]  History and Physical   Briana Ortiz is a 72 y.o. female who presents with varicose veins of the left leg with pain.   She states that she has a varicose vein that runs down her lower leg and she has pain over this area.  She states that it gets worse with walking.  She did use ice last night and this did help.  She states she found a pair of compression hose that her daughter had given her and she put these on and it has helped with the pain and is better.  She does not get much swelling.  She has hx of right knee surgery.  She denies any claudication, rest pain or non healing wounds.  She does not have an hx of DVT or previous venous procedures.  She does not have skin color changes.   She does have hx of gastric ulcers.     The pt is not on a statin for cholesterol management.  The pt is not on a daily aspirin.   Other AC:  none The pt is on CCB, ACEI, diuretic for hypertension.   The pt is not on medication for diabetes.   Tobacco hx:  never  Pt does not know her family hx for AAA or venous disease.    Past Medical History:  Diagnosis Date   Allergies    Arthritis    Dysrhythmia    GERD (gastroesophageal reflux disease)    Hypertension    Uterine fibroid     Past Surgical History:  Procedure Laterality Date   COLONOSCOPY     incomplete 2012    HEMORRHOID SURGERY     TONSILLECTOMY     TOTAL HIP ARTHROPLASTY Left 03/15/2012   Procedure: TOTAL HIP ARTHROPLASTY ANTERIOR APPROACH;  Surgeon: Harvie Junior, MD;  Location: [facility] OR;  Service: Orthopedics;  Laterality: Left;   TOTAL KNEE ARTHROPLASTY Right 09/20/2018   Procedure: RIGHT TOTAL KNEE ARTHROPLASTY;  Surgeon: Jodi Geralds, MD;  Location: [facility];  Service: Orthopedics;  Laterality: Right;   TUBAL LIGATION      Social History   Socioeconomic History   Marital status: Single    Spouse name: Not on file   Number of children: Not on file   Years of  education: Not on file   Highest education level: Not on file  Occupational History   Not on file  Tobacco Use   Smoking status: Never   Smokeless tobacco: Never  Vaping Use   Vaping status: Never Used  Substance and Sexual Activity   Alcohol use: Yes    Alcohol/week: 5.0 - 6.0 standard drinks of alcohol    Types: 5 - 6 Cans of beer per week    Comment: Occasionally.Marland Kitchen 09-18-2018 "once a week"    Drug use: No   Sexual activity: Yes    Birth control/protection: None  Other Topics Concern   Not on file  Social History Narrative   Financial assistance approved for 100% discount at [facility] and has [facility] card   [employer]  December 30, 2009 2:50 PM   Social Drivers of Health   Financial Resource Strain: Not on File (05/19/2021)   [facility] from [employer] [employer]   [employer]    Financial Resource Strain: 0  Food Insecurity: Not on File (10/26/2022)   Received from [facility]  Food Insecurity    Food: 0  Transportation Needs: Not on File (05/19/2021)   Received from [employer], [employer] Needs    Transportation: 0  Physical Activity: Not on File (05/19/2021)   Received from [place], [place]   Physical Activity    Physical Activity: 0  Stress: Not on File (05/19/2021)   Received from [facility], [place]   Stress    Stress: 0  Social Connections: Not on File (10/13/2022)   Received from [employer]   Social Connections    Connectedness: 0  Intimate Partner Violence: Not on file     Family History  Problem Relation Age of Onset   Alcohol abuse Mother    Diabetes Daughter 27   Breast cancer Neg Hx    Colon cancer Neg Hx    Ovarian cancer Neg Hx    Stroke Neg Hx     Current Outpatient Medications  Medication Sig Dispense Refill   acetaminophen-codeine (TYLENOL #3) 300-30 MG tablet Take 1-2 tablets by mouth every 4 (four) hours as needed for moderate pain. 30 tablet 0   cetirizine (ZYRTEC ALLERGY) 10 MG tablet Take 1 tablet (10 mg total) by mouth daily. 30 tablet 1    cyclobenzaprine (FLEXERIL) 10 MG tablet Take 10 mg by mouth at bedtime as needed.     diclofenac Sodium (VOLTAREN) 1 % GEL Apply 2 g topically 4 (four) times daily.     famotidine (PEPCID) 40 MG tablet Take 40 mg by mouth at bedtime.     fluticasone (FLONASE) 50 MCG/ACT nasal spray Place 2 sprays into both nostrils daily. (Patient taking differently: Place 2 sprays into both nostrils daily as needed for allergies.) 16 g 0   gabapentin (NEURONTIN) 300 MG capsule TAKE 1 CAPSULE BY MOUTH NIGHTLY AT BEDTIME     lisinopril-hydrochlorothiazide (PRINZIDE,ZESTORETIC) 20-25 MG tablet TAKE 1 TABLET BY MOUTH ONCE DAILY IN THE MORNING (Patient taking differently: Take 1 tablet by mouth daily.) 90 tablet 2   loratadine (CLARITIN REDITABS) 10 MG dissolvable tablet Take 1 tablet (10 mg total) by mouth every morning. (Patient not taking: Reported on 11/04/2017) 90 tablet 1   meloxicam (MOBIC) 15 MG tablet Take 1 tablet (15 mg total) by mouth daily. 30 tablet 0   methylPREDNISolone (MEDROL DOSEPAK) 4 MG TBPK tablet Take as directed 21 each 0   Misc. Devices (ROLLATOR ULTRA-LIGHT) MISC Use for support, balance  and when walking     Multiple Vitamin (MULTIVITAMIN WITH MINERALS) TABS Take 1 tablet by mouth daily.     NIFEdipine (PROCARDIA-XL/NIFEDICAL-XL) 30 MG 24 hr tablet Take 1 tablet by mouth daily.     omeprazole (PRILOSEC) 40 MG capsule Take 1 capsule by mouth twice daily 60 capsule 3   ondansetron (ZOFRAN-ODT) 4 MG disintegrating tablet Take 1 tablet (4 mg total) by mouth every 8 (eight) hours as needed for nausea or vomiting. 8 tablet 0   oxyCODONE-acetaminophen (PERCOCET) 5-325 MG tablet Take 1 tablet by mouth every 4 (four) hours as needed for severe pain. 30 tablet 0   oxyCODONE-acetaminophen (PERCOCET/ROXICET) 5-325 MG tablet Take 1-2 tablets by mouth every 6 (six) hours as needed for severe pain. 40 tablet 0   penicillin v potassium (VEETID) 500 MG tablet Take by mouth.     prednisoLONE acetate (PRED FORTE)  1 % ophthalmic suspension INSTILL 1 DROP INTO EACH EYE THREE TIMES DAILY FOR 5 DAYS     tiZANidine (ZANAFLEX) 2 MG tablet Take 1 tablet (2 mg total) by mouth every 8 (eight)  hours as needed for muscle spasms. 40 tablet 0   No current facility-administered medications for this visit.    Allergies  Allergen Reactions   Other     REVIEW OF SYSTEMS:   [X]  denotes positive finding, [ ]  denotes negative finding Cardiac  Comments:  Chest pain or chest pressure:    Shortness of breath upon exertion:    Short of breath when lying flat:    Irregular heart rhythm:        Vascular    Pain in calf, thigh, or hip brought on by ambulation:    Pain in feet at night that wakes you up from your sleep:     Blood clot in your veins:    Leg swelling:  x See HPI      Pulmonary    Oxygen at home:    Productive cough:     Wheezing:         Neurologic    Sudden weakness in arms or legs:     Sudden numbness in arms or legs:     Sudden onset of difficulty speaking or slurred speech:    Temporary loss of vision in one eye:     Problems with dizziness:         Gastrointestinal    Blood in stool:     Vomited blood:         Genitourinary    Burning when urinating:     Blood in urine:        Psychiatric    Major depression:         Hematologic    Bleeding problems:    Problems with blood clotting too easily:        Skin    Rashes or ulcers:        Constitutional    Fever or chills:      PHYSICAL EXAMINATION:  Today's Vitals   01/11/23 1234  BP: 123/72  Pulse: (!) 56  Resp: (!) 22  Temp: (!) 97.1 F (36.2 C)  TempSrc: Temporal  SpO2: 94%  Weight: 196 lb 8 oz (89.1 kg)  Height: 5\' 1"  (1.549 m)  PainSc: 7    Body mass index is 37.13 kg/m.   General:  WDWN in NAD; vital signs documented above Gait: Not observed HENT: WNL, normocephalic Pulmonary: normal non-labored breathing without wheezing Cardiac: regular HR; without carotid bruits Abdomen: soft, NT, aortic pulse is  not palpable Skin: without rashes Vascular Exam/Pulses:  Right Left  Radial 2+ (normal) 2+ (normal)  DP 2+ (normal) 2+ (normal)   Extremities: anterior lateral varicose vein present left leg below the knee   Neurologic: A&O X 3;  moving all extremities equally Psychiatric:  The pt has Normal affect.   Non-Invasive Vascular Imaging:   Venous duplex on 11/07/2022: +--------------+---------+------+-----------+------------+-----------------  ---+  LEFT         Reflux NoRefluxReflux TimeDiameter cmsComments                                  Yes                                           +--------------+---------+------+-----------+------------+-----------------  CFV                    yes   >  1 second                                   +--------------+---------+------+-----------+------------+-----------------  FV prox       no                                                      +--------------+---------+------+-----------+------------+-----------------  FV mid        no                                                      +--------------+---------+------+-----------+------------+-----------------  FV dist       no                                                      +--------------+---------+------+-----------+------------+-----------------  Popliteal    no                                                         +--------------+---------+------+-----------+------------+-----------------   GSV at [facility]    no                           0.697                      +--------------+---------+------+-----------+------------+-----------------  GSV prox thighno                           0.369                         +--------------+---------+------+-----------+------------+-----------------  GSV mid thigh no                           0.402                         +--------------+---------+------+-----------+------------+-----------------   GSV dist thighno                           0.351    branch out of fascia  +--------------+---------+------+-----------+------------+-----------------  GSV at knee   no                            0.26                      +--------------+---------+------+-----------+------------+-----------------  GSV prox calf no                           0.151                      +--------------+---------+------+-----------+------------+-----------------  SSV Pop Fossa                               0.26                      +--------------+---------+------+-----------+------------+-----------------  SSV prox calf                              0.192                      +--------------+---------+------+-----------+------------+-----------------  SSV mid calf                                0.15                      +--------------+---------+------+-----------+------------+-----------------   Summary:  Left:  - No evidence of deep vein thrombosis seen in the left lower extremity, from the common femoral through the popliteal veins.  - No evidence of superficial venous thrombosis in the left lower extremity.     Briana Ortiz is a 72 y.o. female who presents with: painful left leg varicose vein    -pt has easily palpable DP pedal pulses bilaterally -pt does not have evidence of DVT.  Pt does have venous reflux in the left deep venous system but no other evidence of venous insufficiency.  She is not a candidate for laser ablation.  Discussed possible sclerotherapy but she did not want to pursue this currently.  I gave her RN card if she decides she would like to have further discussion about this.   -discussed with pt about wearing knee high 15-20 mmHg compression stockings and pt was measured for these today.   Discussed if the ice pack made her leg feel better, she can use this.  Would not recommend ibuprofen due to hx of gastric ulcers.   -discussed the importance of  leg elevation and how to elevate properly - pt is advised to elevate their legs and a diagram is given to them to demonstrate for pt to lay flat on their back with knees elevated and slightly bent with their feet higher than their knees, which puts their feet higher than their heart for 15 minutes per day.  If pt cannot lay flat, advised to lay as flat as possible.  -pt is advised to continue as much walking as possible and avoid sitting or standing for long periods of time.  -discussed importance of weight loss and exercise and that water aerobics would also be beneficial.  -handout with recommendations given -pt will f/u as needed -she did request pain medication and discussed that we cannot give narcotics for varicose vein and she would need to contact her PCP.  She expressed understanding.    Doreatha Massed, [facility] [phone]  Clinic MD:  Karin Lieu

## 2023-04-04 ENCOUNTER — Ambulatory Visit: Payer: 59 | Admitting: Podiatry

## 2023-07-06 ENCOUNTER — Ambulatory Visit: Admitting: Podiatry

## 2023-07-06 DIAGNOSIS — M19072 Primary osteoarthritis, left ankle and foot: Secondary | ICD-10-CM | POA: Diagnosis not present

## 2023-07-06 NOTE — Progress Notes (Signed)
 Subjective:  Patient ID: Briana Ortiz, female    DOB: 03/05/1950,  MRN: [id number]  Chief Complaint  Patient presents with   Nail Problem    Nail trim     73 y.o. female presents with the above complaint.  Patient presents for follow-up of left midfoot pain.  She states the pain has been more localized.  She states injection helped she is here for another 1.  Gives her a few months  Review of Systems: Negative except as noted in the HPI. Denies N/V/F/Ch.  Past Medical History:  Diagnosis Date   Allergies    Arthritis    Dysrhythmia    GERD (gastroesophageal reflux disease)    Hypertension    Uterine fibroid     Current Outpatient Medications:    acetaminophen -codeine  (TYLENOL  #3) 300-30 MG tablet, Take 1-2 tablets by mouth every 4 (four) hours as needed for moderate pain., Disp: 30 tablet, Rfl: 0   cetirizine  (ZYRTEC  ALLERGY) 10 MG tablet, Take 1 tablet (10 mg total) by mouth daily., Disp: 30 tablet, Rfl: 1   cyclobenzaprine (FLEXERIL) 10 MG tablet, Take 10 mg by mouth at bedtime as needed., Disp: , Rfl:    diclofenac  Sodium (VOLTAREN ) 1 % GEL, Apply 2 g topically 4 (four) times daily., Disp: , Rfl:    famotidine  (PEPCID ) 40 MG tablet, Take 40 mg by mouth at bedtime., Disp: , Rfl:    fluticasone  (FLONASE ) 50 MCG/ACT nasal spray, Place 2 sprays into both nostrils daily. (Patient taking differently: Place 2 sprays into both nostrils daily as needed for allergies.), Disp: 16 g, Rfl: 0   gabapentin  (NEURONTIN ) 300 MG capsule, TAKE 1 CAPSULE BY MOUTH NIGHTLY AT BEDTIME, Disp: , Rfl:    lisinopril -hydrochlorothiazide  (PRINZIDE ,ZESTORETIC ) 20-25 MG tablet, TAKE 1 TABLET BY MOUTH ONCE DAILY IN THE MORNING (Patient taking differently: Take 1 tablet by mouth daily.), Disp: 90 tablet, Rfl: 2   loratadine  (CLARITIN  REDITABS) 10 MG dissolvable tablet, Take 1 tablet (10 mg total) by mouth every morning. (Patient not taking: Reported on 11/04/2017), Disp: 90 tablet, Rfl: 1   meloxicam  (MOBIC )  15 MG tablet, Take 1 tablet (15 mg total) by mouth daily., Disp: 30 tablet, Rfl: 0   methylPREDNISolone  (MEDROL  DOSEPAK) 4 MG TBPK tablet, Take as directed, Disp: 21 each, Rfl: 0   Misc. Devices (ROLLATOR ULTRA-LIGHT) MISC, Use for support, balance  and when walking, Disp: , Rfl:    Multiple Vitamin (MULTIVITAMIN WITH MINERALS) TABS, Take 1 tablet by mouth daily., Disp: , Rfl:    NIFEdipine (PROCARDIA-XL/NIFEDICAL-XL) 30 MG 24 hr tablet, Take 1 tablet by mouth daily., Disp: , Rfl:    omeprazole  (PRILOSEC) 40 MG capsule, Take 1 capsule by mouth twice daily, Disp: 60 capsule, Rfl: 3   ondansetron  (ZOFRAN -ODT) 4 MG disintegrating tablet, Take 1 tablet (4 mg total) by mouth every 8 (eight) hours as needed for nausea or vomiting., Disp: 8 tablet, Rfl: 0   oxyCODONE -acetaminophen  (PERCOCET) 5-325 MG tablet, Take 1 tablet by mouth every 4 (four) hours as needed for severe pain., Disp: 30 tablet, Rfl: 0   oxyCODONE -acetaminophen  (PERCOCET/ROXICET) 5-325 MG tablet, Take 1-2 tablets by mouth every 6 (six) hours as needed for severe pain., Disp: 40 tablet, Rfl: 0   penicillin v potassium (VEETID) 500 MG tablet, Take by mouth., Disp: , Rfl:    prednisoLONE acetate (PRED FORTE) 1 % ophthalmic suspension, INSTILL 1 DROP INTO EACH EYE THREE TIMES DAILY FOR 5 DAYS, Disp: , Rfl:    tiZANidine  (ZANAFLEX ) 2 MG  tablet, Take 1 tablet (2 mg total) by mouth every 8 (eight) hours as needed for muscle spasms., Disp: 40 tablet, Rfl: 0  Social History   Tobacco Use  Smoking Status Never  Smokeless Tobacco Never    Allergies  Allergen Reactions   Other    Objective:  There were no vitals filed for this visit. There is no height or weight on file to calculate BMI. Constitutional Well developed. Well nourished.  Vascular Dorsalis pedis pulses palpable bilaterally. Posterior tibial pulses palpable bilaterally. Capillary refill normal to all digits.  No cyanosis or clubbing noted. Pedal hair growth normal.   Neurologic Normal speech. Oriented to person, place, and time. Epicritic sensation to light touch grossly present bilaterally.  Dermatologic Nails well groomed and normal in appearance. No open wounds. No skin lesions.  Orthopedic: Bilateral midfoot arthritis noted.  Pain on palpation to the dorsal foot.  No pain at the Lisfranc ligament no pain at the Lisfranc interval.   Radiographs: 3 views of skeletally mature adult left foot: Arthritic changes noted no fracture was noted.No gross bony abnormalities identified.  Pes planovalgus foot structure noted plantar heel spurring noted midfoot arthritis noted Assessment:   1. Arthritis of left midfoot      Plan:  Patient was evaluated and treated and all questions answered.  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 -Another steroid injection was performed at bilateral dorsal midfoot using 1% plain Lidocaine  and 10 mg of Kenalog . This was well tolerated. -Also discussed shoe gear modification with the patient encouraged her to discontinue the boot at this time   No follow-ups on file.

## 2023-07-24 LAB — COLOGUARD

## 2023-08-03 LAB — COLOGUARD

## 2023-08-07 ENCOUNTER — Other Ambulatory Visit: Payer: 59

## 2023-08-24 LAB — COLOGUARD

## 2023-08-28 ENCOUNTER — Other Ambulatory Visit: Payer: Self-pay | Admitting: Family Medicine

## 2023-08-28 DIAGNOSIS — Z1231 Encounter for screening mammogram for malignant neoplasm of breast: Secondary | ICD-10-CM

## 2023-08-30 ENCOUNTER — Telehealth: Payer: Self-pay

## 2023-08-30 NOTE — Telephone Encounter (Signed)
 Pt called c/o painful varicose vein in her left leg.  Pt knows to elevate her leg above her heart, wear her compression stockings, take Tylenol  for pain. Pt advised to contact PCP for pain management. Pt knows she can try ice or a warm compress to help relieve her pain. Pt knows to avoid long periods of standing and sitting.  Pt is not a candidate for laser ablation.

## 2023-09-04 ENCOUNTER — Other Ambulatory Visit (HOSPITAL_BASED_OUTPATIENT_CLINIC_OR_DEPARTMENT_OTHER)

## 2023-09-12 ENCOUNTER — Ambulatory Visit: Admitting: Podiatry

## 2023-09-20 ENCOUNTER — Ambulatory Visit
Admission: RE | Admit: 2023-09-20 | Discharge: 2023-09-20 | Disposition: A | Discharge status: Home / Self Care | Source: Ambulatory Visit | Attending: Family Medicine | Admitting: Family Medicine

## 2023-09-20 DIAGNOSIS — Z1231 Encounter for screening mammogram for malignant neoplasm of breast: Secondary | ICD-10-CM

## 2023-10-03 ENCOUNTER — Ambulatory Visit: Admitting: Podiatry

## 2023-10-03 DIAGNOSIS — M79674 Pain in right toe(s): Secondary | ICD-10-CM | POA: Diagnosis not present

## 2023-10-03 DIAGNOSIS — G629 Polyneuropathy, unspecified: Secondary | ICD-10-CM

## 2023-10-03 DIAGNOSIS — B351 Tinea unguium: Secondary | ICD-10-CM

## 2023-10-03 DIAGNOSIS — M79675 Pain in left toe(s): Secondary | ICD-10-CM | POA: Diagnosis not present

## 2023-10-03 MED ORDER — PREGABALIN 100 MG PO CAPS: 100.0000 mg | ORAL_CAPSULE | Freq: Two times a day (BID) | ORAL | 0 refills | Status: AC

## 2023-10-03 NOTE — Progress Notes (Unsigned)
  Subjective:  Patient ID: Briana Ortiz, female    DOB: Apr 20, 1950,  MRN: [id number]  Chief Complaint  Patient presents with   Foot Pain    Pt stated that she is having issues with swelling in her left foot she stated that it has a burning sensation no recent injuries or falls  She would like to have her nails trimmed down as well    73 y.o. female returns for the above complaint.  Patient presents with thickened onychodystrophy mycotic toenails x 10 mild pain on palpation worse with ambulation for pressure she would like to have it debrided down she has secondary complaint neuropathy pain.  She is currently on gabapentin  which is not helping she would like to discuss other treatment options for neuropathy.  Objective:  There were no vitals filed for this visit. Podiatric Exam: Vascular: dorsalis pedis and posterior tibial pulses are palpable bilateral. Capillary return is immediate. Temperature gradient is WNL. Skin turgor WNL  Sensorium: Decreased Semmes Weinstein monofilament test.  Decreased tactile sensation bilaterally. Nail Exam: Pt has thick disfigured discolored nails with subungual debris noted bilateral entire nail hallux through fifth toenails.  Pain on palpation to the nails. Ulcer Exam: There is no evidence of ulcer or pre-ulcerative changes or infection. Orthopedic Exam: Muscle tone and strength are WNL. No limitations in general ROM. No crepitus or effusions noted.  Skin: No Porokeratosis. No infection or ulcers    Assessment & Plan:  No diagnosis found.  Patient was evaluated and treated and all questions answered.  Onychomycosis with pain  -Nails palliatively debrided as below. -Educated on self-care  Procedure: Nail Debridement Rationale: pain  Type of Debridement: manual, sharp debridement. Instrumentation: Nail nipper, rotary burr. Number of Nails: 10  Procedures and Treatment: Consent by patient was obtained for treatment procedures. The patient  understood the discussion of treatment and procedures well. All questions were answered thoroughly reviewed. Debridement of mycotic and hypertrophic toenails, 1 through 5 bilateral and clearing of subungual debris. No ulceration, no infection noted.  Return Visit-Office Procedure: Patient instructed to return to the office for a follow up visit 3 months for continued evaluation and treatment.  Franky Blanch, DPM    No follow-ups on file.

## 2024-02-10 ENCOUNTER — Ambulatory Visit (HOSPITAL_COMMUNITY)

## 2024-02-10 ENCOUNTER — Emergency Department (HOSPITAL_COMMUNITY)

## 2024-02-10 ENCOUNTER — Encounter (HOSPITAL_COMMUNITY): Payer: Self-pay

## 2024-02-10 ENCOUNTER — Emergency Department (HOSPITAL_COMMUNITY)
Admission: EM | Admit: 2024-02-10 | Discharge: 2024-02-10 | Disposition: A | Discharge status: Home / Self Care | Attending: Emergency Medicine | Admitting: Emergency Medicine

## 2024-02-10 ENCOUNTER — Ambulatory Visit (HOSPITAL_COMMUNITY): Admission: EM | Admit: 2024-02-10 | Discharge: 2024-02-10 | Disposition: A | Discharge status: Home / Self Care

## 2024-02-10 ENCOUNTER — Other Ambulatory Visit: Payer: Self-pay

## 2024-02-10 DIAGNOSIS — Z79899 Other long term (current) drug therapy: Secondary | ICD-10-CM | POA: Insufficient documentation

## 2024-02-10 DIAGNOSIS — I10 Essential (primary) hypertension: Secondary | ICD-10-CM | POA: Insufficient documentation

## 2024-02-10 DIAGNOSIS — R109 Unspecified abdominal pain: Secondary | ICD-10-CM | POA: Diagnosis present

## 2024-02-10 DIAGNOSIS — R1012 Left upper quadrant pain: Secondary | ICD-10-CM | POA: Diagnosis not present

## 2024-02-10 LAB — COMPREHENSIVE METABOLIC PANEL WITH GFR
ALT: 9 U/L (ref 0–44)
AST: 18 U/L (ref 15–41)
Albumin: 4.3 g/dL (ref 3.5–5.0)
Alkaline Phosphatase: 66 U/L (ref 38–126)
Anion gap: 12 (ref 5–15)
BUN: 12 mg/dL (ref 8–23)
CO2: 27 mmol/L (ref 22–32)
Calcium: 10.2 mg/dL (ref 8.9–10.3)
Chloride: 103 mmol/L (ref 98–111)
Creatinine, Ser: 0.83 mg/dL (ref 0.44–1.00)
GFR, Estimated: >60 mL/min
Glucose, Bld: 86 mg/dL (ref 70–99)
Potassium: 3.9 mmol/L (ref 3.5–5.1)
Sodium: 141 mmol/L (ref 135–145)
Total Bilirubin: 0.3 mg/dL (ref 0.0–1.2)
Total Protein: 7.3 g/dL (ref 6.5–8.1)

## 2024-02-10 LAB — URINALYSIS, ROUTINE W REFLEX MICROSCOPIC
Bacteria, UA: NONE SEEN
Bilirubin Urine: NEGATIVE
Glucose, UA: NEGATIVE mg/dL
Hgb urine dipstick: NEGATIVE
Ketones, ur: NEGATIVE mg/dL
Nitrite: NEGATIVE
Protein, ur: NEGATIVE mg/dL
Specific Gravity, Urine: 1.026 (ref 1.005–1.030)
pH: 7 (ref 5.0–8.0)

## 2024-02-10 LAB — CBC WITH DIFFERENTIAL/PLATELET
Abs Immature Granulocytes: 0.02 K/uL (ref 0.00–0.07)
Basophils Absolute: 0 K/uL (ref 0.0–0.1)
Basophils Relative: 1 %
Eosinophils Absolute: 0.2 K/uL (ref 0.0–0.5)
Eosinophils Relative: 2 %
HCT: 45.1 % (ref 36.0–46.0)
Hemoglobin: 14.7 g/dL (ref 12.0–15.0)
Immature Granulocytes: 0 %
Lymphocytes Relative: 25 %
Lymphs Abs: 1.8 K/uL (ref 0.7–4.0)
MCH: 31.7 pg (ref 26.0–34.0)
MCHC: 32.6 g/dL (ref 30.0–36.0)
MCV: 97.4 fL (ref 80.0–100.0)
Monocytes Absolute: 0.3 K/uL (ref 0.1–1.0)
Monocytes Relative: 4 %
Neutro Abs: 5 K/uL (ref 1.7–7.7)
Neutrophils Relative %: 68 %
Platelets: 210 K/uL (ref 150–400)
RBC: 4.63 MIL/uL (ref 3.87–5.11)
RDW: 13.2 % (ref 11.5–15.5)
WBC: 7.3 K/uL (ref 4.0–10.5)
nRBC: 0 % (ref 0.0–0.2)

## 2024-02-10 LAB — LIPASE, BLOOD: Lipase: 31 U/L (ref 11–51)

## 2024-02-10 MED ORDER — MORPHINE SULFATE (PF) 4 MG/ML IV SOLN
4.0000 mg | Freq: Once | INTRAVENOUS | Status: AC
  Administered 2024-02-10: 4 mg via INTRAVENOUS
  Filled 2024-02-10: qty 1

## 2024-02-10 MED ORDER — IOHEXOL 350 MG/ML SOLN
75.0000 mL | Freq: Once | INTRAVENOUS | Status: AC | PRN
  Administered 2024-02-10: 75 mL via INTRAVENOUS

## 2024-02-10 MED ORDER — LACTATED RINGERS IV BOLUS
500.0000 mL | Freq: Once | INTRAVENOUS | Status: AC
  Administered 2024-02-10: 500 mL via INTRAVENOUS

## 2024-02-10 MED ORDER — ONDANSETRON HCL 4 MG/2ML IJ SOLN
4.0000 mg | Freq: Once | INTRAMUSCULAR | Status: AC
  Administered 2024-02-10: 4 mg via INTRAVENOUS
  Filled 2024-02-10: qty 2

## 2024-02-10 NOTE — ED Notes (Signed)
 Patient is being discharged from the Urgent Care and sent to the Emergency Department via pov . Per Lennice, NP, patient is in need of higher level of care due to limited resources and potential bowel obstruction. Patient is aware and verbalizes understanding of plan of care.  Vitals:   02/10/24 1156  BP: 137/86  Pulse: (!) 58  Resp: 16  Temp: 97.8 F (36.6 C)  SpO2: 93%

## 2024-02-10 NOTE — ED Provider Triage Note (Signed)
 Emergency Medicine Provider Triage Evaluation Note  Briana Ortiz , a 74 y.o. female  was evaluated in triage.  Pt complains of abdominal pain, sent from urgent care for CT.  Concern for ileus/early SBO.  Review of Systems  Positive: Left upper quadrant abdominal pain, constipation x 4 to 5 days Negative: Fever, chills  Physical Exam  There were no vitals taken for this visit. Gen:   Awake,  Resp:  Normal effort  MSK:   Moves extremities without difficulty  Other:    Medical Decision Making  Medically screening exam initiated at 2:37 PM.  Appropriate orders placed.  Briana Ortiz was informed that the remainder of the evaluation will be completed by another provider, this initial triage assessment does not replace that evaluation, and the importance of remaining in the ED until their evaluation is complete.  Labs and imaging ordered   Briana Ortiz 02/10/24 1443

## 2024-02-10 NOTE — ED Provider Notes (Signed)
 " [facility]    CSN: [id number] Arrival date & time: 02/10/24  1008      History   Chief Complaint Chief Complaint  Patient presents with   Abdominal Pain    HPI Briana Ortiz is a 74 y.o. female.   This 74 year old female is being seen for complaints of left upper quadrant pain ongoing for 1 week.  She says that the pain is extending to her left lower quadrant.  She reports history of constipation.  She says she frequently has to use suppositories to induce a bowel movement.  Last bowel movement was Tuesday, and was normal consistency for her.  She says she initially had generalized bodyaches approximately 1-1/2-2 weeks ago when she was sick with unknown virus.  She says her body aches resolved except for this pain in her left upper quadrant.  She says the pain is constant.  Nothing makes it better or worse.  She has tried using Voltaren  gel, mineral oil, heating pad with no significant relief of symptoms.  She says she has to lay a certain way in order to be able to get rest at night.  She denies injury, trauma, fall.   Abdominal Pain Associated symptoms: constipation   Associated symptoms: no chest pain, no chills, no cough, no diarrhea, no dysuria, no fever, no nausea, no shortness of breath and no vomiting     Past Medical History:  Diagnosis Date   Allergies    Arthritis    Dysrhythmia    GERD (gastroesophageal reflux disease)    Hypertension    Uterine fibroid     Patient Active Problem List   Diagnosis Date Noted   Pain of left lower extremity 12/09/2021   Vitamin D deficiency 10/15/2020   Generalized muscle weakness 08/16/2020   Need for assistance with personal care 08/16/2020   Gastric ulcer 11/19/2018   Nausea and vomiting 11/19/2018   Abdominal pain, epigastric 11/19/2018   Primary osteoarthritis of right knee 09/20/2018   Obese 08/09/2017   Bradycardia 03/02/2017   Nail abnormality 07/24/2016   Acute viral sinusitis 07/24/2016    Hemarthrosis of right knee 05/26/2016   Biceps tendinitis 01/13/2016   Pruritic rash 08/26/2015   Chronic back pain 05/27/2014   Uterine fibroid 01/21/2014   NSAID long-term use 09/12/2013   Left hand pain 08/15/2012   Osteoarthritis of left hip 03/14/2011   Essential hypertension 12/09/2005   GERD 12/09/2005    Past Surgical History:  Procedure Laterality Date   COLONOSCOPY     incomplete 2012    HEMORRHOID SURGERY     TONSILLECTOMY     TOTAL HIP ARTHROPLASTY Left 03/15/2012   Procedure: TOTAL HIP ARTHROPLASTY ANTERIOR APPROACH;  Surgeon: Norleen LITTIE Gavel, MD;  Location: [facility];  Service: Orthopedics;  Laterality: Left;   TOTAL KNEE ARTHROPLASTY Right 09/20/2018   Procedure: RIGHT TOTAL KNEE ARTHROPLASTY;  Surgeon: Gavel Norleen, MD;  Location: [facility];  Service: Orthopedics;  Laterality: Right;   TUBAL LIGATION      OB History     Gravida  1   Para  1   Term  1   Preterm      AB      Living  1      SAB      IAB      Ectopic      Multiple      Live Births               Home Medications  Prior to Admission medications  Medication Sig Start Date End Date Taking? Authorizing Provider  acetaminophen -codeine  (TYLENOL  #3) 300-30 MG tablet Take 1-2 tablets by mouth every 4 (four) hours as needed for moderate pain. Patient not taking: Reported on 02/10/2024 06/01/22   Tobie Franky SQUIBB, DPM  cetirizine  (ZYRTEC  ALLERGY) 10 MG tablet Take 1 tablet (10 mg total) by mouth daily. 09/20/15   Tapia, Leisa, PA-C  cyclobenzaprine (FLEXERIL) 10 MG tablet Take 10 mg by mouth at bedtime as needed. Patient not taking: Reported on 02/10/2024 05/12/21   [provider]  diclofenac  Sodium (VOLTAREN ) 1 % GEL Apply 2 g topically 4 (four) times daily. 05/12/21   [provider]  famotidine  (PEPCID ) 40 MG tablet Take 40 mg by mouth at bedtime. 05/12/21   [provider]  fluticasone  (FLONASE ) 50 MCG/ACT nasal spray Place 2 sprays into both nostrils daily. Patient  taking differently: Place 2 sprays into both nostrils daily as needed for allergies. 07/24/16   Alfornia Madison, MD  gabapentin  (NEURONTIN ) 300 MG capsule TAKE 1 CAPSULE BY MOUTH NIGHTLY AT BEDTIME Patient not taking: Reported on 02/10/2024 09/26/21   [provider]  lisinopril -hydrochlorothiazide  (PRINZIDE ,ZESTORETIC ) 20-25 MG tablet TAKE 1 TABLET BY MOUTH ONCE DAILY IN THE MORNING Patient taking differently: Take 1 tablet by mouth daily. 08/07/17   Johnetta Marsa SAILOR, MD  loratadine  (CLARITIN  REDITABS) 10 MG dissolvable tablet Take 1 tablet (10 mg total) by mouth every morning. Patient not taking: Reported on 11/04/2017 08/26/15 06/22/18  Rivet, Connell PARAS, MD  meloxicam  (MOBIC ) 15 MG tablet Take 1 tablet (15 mg total) by mouth daily. Patient not taking: Reported on 02/10/2024 04/06/22   Tobie Franky SQUIBB, DPM  methylPREDNISolone  (MEDROL  DOSEPAK) 4 MG TBPK tablet Take as directed Patient not taking: Reported on 02/10/2024 04/06/22   Tobie Franky SQUIBB, DPM  Misc. Devices (ROLLATOR ULTRA-LIGHT) MISC Use for support, balance  and when walking 11/01/21   [provider]  Multiple Vitamin (MULTIVITAMIN WITH MINERALS) TABS Take 1 tablet by mouth daily.    [provider]  NIFEdipine (PROCARDIA-XL/NIFEDICAL-XL) 30 MG 24 hr tablet Take 1 tablet by mouth daily. 09/15/22 09/15/23  [provider]  omeprazole  (PRILOSEC) 40 MG capsule Take 1 capsule by mouth twice daily 07/05/20   Zehr, Jessica D, PA-C  ondansetron  (ZOFRAN -ODT) 4 MG disintegrating tablet Take 1 tablet (4 mg total) by mouth every 8 (eight) hours as needed for nausea or vomiting. 09/29/18   Patsey Lot, MD  oxyCODONE -acetaminophen  (PERCOCET) 5-325 MG tablet Take 1 tablet by mouth every 4 (four) hours as needed for severe pain. Patient not taking: Reported on 02/10/2024 04/17/22   Tobie Franky SQUIBB, DPM  oxyCODONE -acetaminophen  (PERCOCET/ROXICET) 5-325 MG tablet Take 1-2 tablets by mouth every 6 (six) hours as needed for severe  pain. 09/20/18   Rondall Agent, PA-C  penicillin v potassium (VEETID) 500 MG tablet Take by mouth. Patient not taking: Reported on 02/10/2024    [provider]  prednisoLONE acetate (PRED FORTE) 1 % ophthalmic suspension INSTILL 1 DROP INTO EACH EYE THREE TIMES DAILY FOR 5 DAYS    [provider]  pregabalin  (LYRICA ) 100 MG capsule Take 1 capsule (100 mg total) by mouth 2 (two) times daily. Patient not taking: Reported on 02/10/2024 10/03/23   Tobie Franky SQUIBB, DPM  tiZANidine  (ZANAFLEX ) 2 MG tablet Take 1 tablet (2 mg total) by mouth every 8 (eight) hours as needed for muscle spasms. Patient not taking: Reported on 02/10/2024 09/20/18   Rondall Agent, PA-C  Family History Family History  Problem Relation Age of Onset   Alcohol abuse Mother    Diabetes Daughter 64   Breast cancer Neg Hx    Colon cancer Neg Hx    Ovarian cancer Neg Hx    Stroke Neg Hx     Social History Social History[1]   Allergies   Other   Review of Systems Review of Systems  Constitutional:  Negative for appetite change, chills and fever.  Respiratory:  Negative for cough and shortness of breath.   Cardiovascular:  Negative for chest pain.  Gastrointestinal:  Positive for abdominal pain and constipation. Negative for diarrhea, nausea and vomiting.  Genitourinary:  Negative for difficulty urinating, dysuria, flank pain, frequency and urgency.  Skin:  Negative for color change and rash.  Neurological:  Negative for dizziness and headaches.  All other systems reviewed and are negative.    Physical Exam Triage Vital Signs ED Triage Vitals [02/10/24 1156]  Encounter Vitals Group     BP 137/86     Girls Systolic BP Percentile      Girls Diastolic BP Percentile      Boys Systolic BP Percentile      Boys Diastolic BP Percentile      Pulse Rate (!) 58     Resp 16     Temp 97.8 F (36.6 C)     Temp Source Oral     SpO2 93 %     Weight      Height      Head Circumference      Peak  Flow      Pain Score 9     Pain Loc      Pain Education      Exclude from Growth Chart    No data found.  Updated Vital Signs BP 137/86 (BP Location: Right Arm)   Pulse (!) 58   Temp 97.8 F (36.6 C) (Oral)   Resp 16   SpO2 93%   Visual Acuity Right Eye Distance:   Left Eye Distance:   Bilateral Distance:    Right Eye Near:   Left Eye Near:    Bilateral Near:     Physical Exam Vitals and nursing note reviewed.  Constitutional:      General: She is not in acute distress.    Appearance: She is well-developed. She is not toxic-appearing.     Comments: Pleasant female appearing stated age found sitting in chair in no acute distress.  HENT:     Head: Normocephalic and atraumatic.  Eyes:     Conjunctiva/sclera: Conjunctivae normal.  Cardiovascular:     Rate and Rhythm: Normal rate and regular rhythm.     Heart sounds: Normal heart sounds. No murmur heard. Pulmonary:     Effort: Pulmonary effort is normal. No respiratory distress.     Breath sounds: Normal breath sounds.  Abdominal:     General: Bowel sounds are decreased.     Palpations: Abdomen is soft.     Tenderness: There is abdominal tenderness in the left upper quadrant and left lower quadrant. There is no left CVA tenderness, guarding or rebound.   Skin:    General: Skin is warm and dry.     Capillary Refill: Capillary refill takes less than 2 seconds.  Neurological:     Mental Status: She is alert.  Psychiatric:        Mood and Affect: Mood normal.      UC Treatments / Results  Labs (all  labs ordered are listed, but only abnormal results are displayed) Labs Reviewed - No data to display  EKG   Radiology DG Abd 2 Views Result Date: 02/10/2024 CLINICAL DATA:  Abdominal pain. EXAM: ABDOMEN - 2 VIEW COMPARISON:  09/29/2018 FINDINGS: A single mildly dilated small bowel loops seen in the left abdomen. Large stool burden noted throughout the colon. 89 cm calcified uterine fibroid noted in the pelvis,  without significant change. IMPRESSION: Single mildly dilated small bowel loop in left abdomen, which may be due to focal ileus or early small bowel obstruction. Large stool burden noted. Electronically Signed   By: Norleen DELENA Kil M.D.   On: 02/10/2024 13:14    Procedures Procedures (including critical care time)  Medications Ordered in UC Medications - No data to display  Initial Impression / Assessment and Plan / UC Course  I have reviewed the triage vital signs and the nursing notes.  Pertinent labs & imaging results that were available during my care of the patient were reviewed by me and considered in my medical decision making (see chart for details).     Vitals and triage reviewed, patient is hemodynamically stable.  Abdominal x-ray obtained.  Abdominal x-ray significant for single mildly dilated small bowel loop in the left abdomen which may be due to focal ileus or early small bowel obstruction.  She is advised to go to the emergency department for further evaluation.  Initially she stated she would not go to the emergency department.  Extensive discussion regarding risks of not going to emergency department including worsening abdominal pain, bowel perforation, sepsis, death.  Advised if she was not agreeable to going to the emergency department, she would need to sign AMA paperwork.  She then asked if she could use a phone to call her daughter.  I went and got a cordless phone for patient to use.  Initially the line was in use.  As soon as phone mom is open, open the phone line and walked patient's room.  Patient appears to have eloped.  Unable to locate patient.  Radiology tech states patient told her she was tired of waiting.  Registration and charge nurse then approached me stating that patient was in the lobby saying she needed her discharge paperwork to go to the emergency department.  Charge nurse went to speak with patient and advise no paperwork is needed.  She just needs to present  to emergency department and tell them that she was sent from urgent care.  Charge nurse reports patient was agreeable to presenting to emergency department.  She was stable for private transport. Final Clinical Impressions(s) / UC Diagnoses   Final diagnoses:  Left upper quadrant abdominal pain   Discharge Instructions   None    ED Prescriptions   None    PDMP not reviewed this encounter.     [1]  Social History Tobacco Use   Smoking status: Never   Smokeless tobacco: Never  Vaping Use   Vaping status: Never Used  Substance Use Topics   Alcohol use: Not Currently    Alcohol/week: 5.0 - 6.0 standard drinks of alcohol    Types: 5 - 6 Cans of beer per week    Comment: Occasionally.SABRA 09-18-2018 once a week    Drug use: No     Lennice Jon BROCKS, FNP 02/10/24 1404  "

## 2024-02-10 NOTE — ED Triage Notes (Signed)
 Pt states she went to UC and they stated pt has bowel obstruction. No scans done. C/O upper left abd pain for a week.  Denies nausea/vomiting. Last BM was 4-5 days ago. Axox4.

## 2024-02-10 NOTE — ED Provider Notes (Signed)
 " [facility] Provider Note   CSN: [id number] Arrival date & time: 02/10/24  1431     Patient presents with: No chief complaint on file.   Briana Ortiz is a 74 y.o. female.   HPI   Patient has a history of acid reflux hypertension fibroids allergies.  Patient states she has been having trouble with pain of the left side of her abdomen for the past week.  It is mostly in the mid abdomen on the left side.  She states it is a constant pain it has been keeping her up at night.  She has not had any vomiting or diarrhea.  No fevers.  No constipation.  Patient went to an urgent care today.  She states she was told after x-rays that she needed to come to the emergency room to be evaluated.  Prior to Admission medications  Medication Sig Start Date End Date Taking? Authorizing Provider  acetaminophen -codeine  (TYLENOL  #3) 300-30 MG tablet Take 1-2 tablets by mouth every 4 (four) hours as needed for moderate pain. Patient not taking: Reported on 02/10/2024 06/01/22   Tobie Franky SQUIBB, DPM  cetirizine  (ZYRTEC  ALLERGY) 10 MG tablet Take 1 tablet (10 mg total) by mouth daily. 09/20/15   Tapia, Leisa, PA-C  cyclobenzaprine (FLEXERIL) 10 MG tablet Take 10 mg by mouth at bedtime as needed. Patient not taking: Reported on 02/10/2024 05/12/21   [provider]  diclofenac  Sodium (VOLTAREN ) 1 % GEL Apply 2 g topically 4 (four) times daily. 05/12/21   [provider]  famotidine  (PEPCID ) 40 MG tablet Take 40 mg by mouth at bedtime. 05/12/21   [provider]  fluticasone  (FLONASE ) 50 MCG/ACT nasal spray Place 2 sprays into both nostrils daily. Patient taking differently: Place 2 sprays into both nostrils daily as needed for allergies. 07/24/16   Alfornia Madison, MD  gabapentin  (NEURONTIN ) 300 MG capsule TAKE 1 CAPSULE BY MOUTH NIGHTLY AT BEDTIME Patient not taking: Reported on 02/10/2024 09/26/21   [provider]   lisinopril -hydrochlorothiazide  (PRINZIDE ,ZESTORETIC ) 20-25 MG tablet TAKE 1 TABLET BY MOUTH ONCE DAILY IN THE MORNING Patient taking differently: Take 1 tablet by mouth daily. 08/07/17   Johnetta Marsa SAILOR, MD  loratadine  (CLARITIN  REDITABS) 10 MG dissolvable tablet Take 1 tablet (10 mg total) by mouth every morning. Patient not taking: Reported on 11/04/2017 08/26/15 06/22/18  Rivet, Connell PARAS, MD  meloxicam  (MOBIC ) 15 MG tablet Take 1 tablet (15 mg total) by mouth daily. Patient not taking: Reported on 02/10/2024 04/06/22   Tobie Franky SQUIBB, DPM  methylPREDNISolone  (MEDROL  DOSEPAK) 4 MG TBPK tablet Take as directed Patient not taking: Reported on 02/10/2024 04/06/22   Tobie Franky SQUIBB, DPM  Misc. Devices (ROLLATOR ULTRA-LIGHT) MISC Use for support, balance  and when walking 11/01/21   [provider]  Multiple Vitamin (MULTIVITAMIN WITH MINERALS) TABS Take 1 tablet by mouth daily.    [provider]  NIFEdipine (PROCARDIA-XL/NIFEDICAL-XL) 30 MG 24 hr tablet Take 1 tablet by mouth daily. 09/15/22 09/15/23  [provider]  omeprazole  (PRILOSEC) 40 MG capsule Take 1 capsule by mouth twice daily 07/05/20   Zehr, Jessica D, PA-C  ondansetron  (ZOFRAN -ODT) 4 MG disintegrating tablet Take 1 tablet (4 mg total) by mouth every 8 (eight) hours as needed for nausea or vomiting. 09/29/18   Patsey Lot, MD  oxyCODONE -acetaminophen  (PERCOCET) 5-325 MG tablet Take 1 tablet by mouth every 4 (four) hours as needed for severe pain. Patient not taking: Reported on  02/10/2024 04/17/22   Tobie Franky SQUIBB, DPM  oxyCODONE -acetaminophen  (PERCOCET/ROXICET) 5-325 MG tablet Take 1-2 tablets by mouth every 6 (six) hours as needed for severe pain. 09/20/18   Rondall Agent, PA-C  penicillin v potassium (VEETID) 500 MG tablet Take by mouth. Patient not taking: Reported on 02/10/2024    [provider]  prednisoLONE acetate (PRED FORTE) 1 % ophthalmic suspension INSTILL 1 DROP INTO EACH EYE THREE TIMES DAILY  FOR 5 DAYS    [provider]  pregabalin  (LYRICA ) 100 MG capsule Take 1 capsule (100 mg total) by mouth 2 (two) times daily. Patient not taking: Reported on 02/10/2024 10/03/23   Tobie Franky SQUIBB, DPM  tiZANidine  (ZANAFLEX ) 2 MG tablet Take 1 tablet (2 mg total) by mouth every 8 (eight) hours as needed for muscle spasms. Patient not taking: Reported on 02/10/2024 09/20/18   Rondall Agent, PA-C    Allergies: Flonase  [fluticasone ] and Other    Review of Systems  Updated Vital Signs BP 139/81   Pulse (!) 53   Temp (!) 97.4 F (36.3 C) (Oral)   Resp (!) 23   Ht 1.549 m (5' 1)   Wt 90.7 kg   SpO2 99%   BMI 37.79 kg/m   Physical Exam Vitals and nursing note reviewed.  Constitutional:      General: She is not in acute distress.    Appearance: She is well-developed.  HENT:     Head: Normocephalic and atraumatic.     Right Ear: External ear normal.     Left Ear: External ear normal.  Eyes:     General: No scleral icterus.       Right eye: No discharge.        Left eye: No discharge.     Conjunctiva/sclera: Conjunctivae normal.  Neck:     Trachea: No tracheal deviation.  Cardiovascular:     Rate and Rhythm: Normal rate and regular rhythm.  Pulmonary:     Effort: Pulmonary effort is normal. No respiratory distress.     Breath sounds: Normal breath sounds. No stridor. No wheezing or rales.  Abdominal:     General: Bowel sounds are normal. There is no distension.     Palpations: Abdomen is soft.     Tenderness: There is abdominal tenderness. There is no guarding or rebound.     Comments: Left-sided abdominal tenderness  Musculoskeletal:        General: No tenderness or deformity.     Cervical back: Neck supple.  Skin:    General: Skin is warm and dry.     Findings: No rash.  Neurological:     General: No focal deficit present.     Mental Status: She is alert.     Cranial Nerves: No cranial nerve deficit, dysarthria or facial asymmetry.     Sensory: No sensory  deficit.     Motor: No abnormal muscle tone or seizure activity.     Coordination: Coordination normal.  Psychiatric:        Mood and Affect: Mood normal.     (all labs ordered are listed, but only abnormal results are displayed) Labs Reviewed  URINALYSIS, ROUTINE W REFLEX MICROSCOPIC - Abnormal; Notable for the following components:      Result Value   Color, Urine STRAW (*)    Leukocytes,Ua TRACE (*)    All other components within normal limits  COMPREHENSIVE METABOLIC PANEL WITH GFR  CBC WITH DIFFERENTIAL/PLATELET  LIPASE, BLOOD    EKG: None  Radiology:  CT ABDOMEN PELVIS W CONTRAST Result Date: 02/10/2024 EXAM: CT ABDOMEN AND PELVIS WITH CONTRAST 02/10/2024 06:14:19 PM TECHNIQUE: CT of the abdomen and pelvis was performed with the administration of intravenous contrast. 75 mL of iohexol  (OMNIPAQUE ) 350 MG/ML injection was administered. Multiplanar reformatted images are provided for review. Automated exposure control, iterative reconstruction, and/or weight-based adjustment of the mA/kV was utilized to reduce the radiation dose to as low as reasonably achievable. COMPARISON: None available. CLINICAL HISTORY: Bowel obstruction suspected. FINDINGS: LOWER CHEST: Pulmonary parenchymal consolidation or volume loss. No pericardial or pleural effusion. LIVER: The liver is unremarkable. GALLBLADDER AND BILE DUCTS: Gallbladder is unremarkable. No biliary ductal dilatation. SPLEEN: No acute abnormality. PANCREAS: No acute abnormality. ADRENAL GLANDS: No acute abnormality. KIDNEYS, URETERS AND BLADDER: No stones in the kidneys or ureters. No hydronephrosis. No perinephric or periureteral stranding. Urinary bladder is unremarkable. GI AND BOWEL: Stomach demonstrates no acute abnormality. There is no bowel obstruction. PERITONEUM AND RETROPERITONEUM: No ascites. No free air. VASCULATURE: Aorta is normal in caliber. LYMPH NODES: No lymphadenopathy. REPRODUCTIVE ORGANS: Multiple uterine fibroids. The  dominant fibroid is calcified, measuring 7 cm. BONES AND SOFT TISSUES: No acute osseous abnormality. No focal soft tissue abnormality. IMPRESSION: 1. No acute abdominal or pelvic pathology. 2. Multiple uterine fibroids, with the dominant fibroid being calcified and measuring 7 cm. Electronically signed by: Fonda Field MD MD 02/10/2024 06:19 PM EST RP Workstation: [email or web address] Abd 2 Views Result Date: 02/10/2024 CLINICAL DATA:  Abdominal pain. EXAM: ABDOMEN - 2 VIEW COMPARISON:  09/29/2018 FINDINGS: A single mildly dilated small bowel loops seen in the left abdomen. Large stool burden noted throughout the colon. 89 cm calcified uterine fibroid noted in the pelvis, without significant change. IMPRESSION: Single mildly dilated small bowel loop in left abdomen, which may be due to focal ileus or early small bowel obstruction. Large stool burden noted. Electronically Signed   By: Norleen DELENA Kil M.D.   On: 02/10/2024 13:14     Procedures   Medications Ordered in the ED  morphine  (PF) 4 MG/ML injection 4 mg (4 mg Intravenous Given 02/10/24 1719)  ondansetron  (ZOFRAN ) injection 4 mg (4 mg Intravenous Given 02/10/24 1719)  lactated ringers  bolus 500 mL (0 mLs Intravenous Stopped 02/10/24 1800)  iohexol  (OMNIPAQUE ) 350 MG/ML injection 75 mL (75 mLs Intravenous Contrast Given 02/10/24 1813)    Clinical Course as of 02/10/24 1859  Sun Feb 10, 2024  1850 CT ABDOMEN PELVIS W CONTRAST CT scan does not show any acute abnormality.  Uterine fibroids noted [JK]  1850 CBC metabolic panel normal.  Urinalysis normal [JK]    Clinical Course User Index [JK] Randol Simmonds, MD                                 Medical Decision Making Amount and/or Complexity of Data Reviewed Labs: ordered. Radiology:  Decision-making details documented in ED Course.  Risk Prescription drug management.   Patient presented to the ED for evaluation of persistent left-sided abdominal pain.  Patient did not report any vomiting  diarrhea constipation.  Patient went to an urgent care and had plain films that suggest the possibility of small bowel obstruction.  Patient was sent to the ED for further evaluation.  Patient remained comfortable in the ED.  Her ED workup is reassuring.  Laboratory test and CT scan are unremarkable.  Etiology of her symptoms unclear but at this time she is stable for discharge  outpatient follow-up.  We did discuss follow-up with her primary care doctor and possibly a GI doctor to discuss colonoscopy if symptoms persist.     Final diagnoses:  Abdominal pain, unspecified abdominal location    ED Discharge Orders     None          Randol Simmonds, MD 02/10/24 1859  "

## 2024-02-10 NOTE — ED Notes (Signed)
 Spoke to patient about going to ED.  Explained the seriousness of her diagnosis and the need for a higher level of care and early intervention. Patient states understanding and left with intent of going to ED now

## 2024-02-10 NOTE — ED Triage Notes (Signed)
 Patient c/o LUQ pain x 1 week. Patient states the pain is worse when she raises her left arm. Patient denies any injury or exercising.  Patient states she has been using a heating pad, Voltaren  gel and mineral ice for her pain.

## 2024-02-10 NOTE — Discharge Instructions (Addendum)
 Requires higher level of care with resources not available to urgent care.

## 2024-02-10 NOTE — Discharge Instructions (Signed)
 Your laboratory tests and CT scan were reassuring.  The CT scan did not show any signs of intestinal blockage colitis diverticulitis or other acute abnormality.  Follow-up with your primary care doctor or consider seeing your GI doctor as we discussed for further evaluation.
